# Patient Record
Sex: Female | Born: 1935 | Race: White | Hispanic: No | Smoking: Former smoker
Health system: Southern US, Community
[De-identification: ages and names within clinical notes are randomized; demographics above are authoritative.]

## PROBLEM LIST (undated history)

## (undated) DIAGNOSIS — Z0000 Encounter for general adult medical examination without abnormal findings: Secondary | ICD-10-CM

## (undated) DIAGNOSIS — I10 Essential (primary) hypertension: Secondary | ICD-10-CM

## (undated) DIAGNOSIS — R350 Frequency of micturition: Secondary | ICD-10-CM

## (undated) DIAGNOSIS — B354 Tinea corporis: Secondary | ICD-10-CM

## (undated) DIAGNOSIS — R413 Other amnesia: Secondary | ICD-10-CM

## (undated) DIAGNOSIS — S46911A Strain of unspecified muscle, fascia and tendon at shoulder and upper arm level, right arm, initial encounter: Secondary | ICD-10-CM

## (undated) DIAGNOSIS — R42 Dizziness and giddiness: Secondary | ICD-10-CM

## (undated) DIAGNOSIS — F32A Depression, unspecified: Secondary | ICD-10-CM

## (undated) DIAGNOSIS — J309 Allergic rhinitis, unspecified: Secondary | ICD-10-CM

## (undated) DIAGNOSIS — E785 Hyperlipidemia, unspecified: Secondary | ICD-10-CM

## (undated) DIAGNOSIS — Z124 Encounter for screening for malignant neoplasm of cervix: Secondary | ICD-10-CM

## (undated) DIAGNOSIS — F4321 Adjustment disorder with depressed mood: Secondary | ICD-10-CM

## (undated) DIAGNOSIS — K5790 Diverticulosis of intestine, part unspecified, without perforation or abscess without bleeding: Secondary | ICD-10-CM

## (undated) DIAGNOSIS — I701 Atherosclerosis of renal artery: Secondary | ICD-10-CM

## (undated) DIAGNOSIS — Z22322 Carrier or suspected carrier of Methicillin resistant Staphylococcus aureus: Secondary | ICD-10-CM

## (undated) DIAGNOSIS — G4700 Insomnia, unspecified: Secondary | ICD-10-CM

## (undated) DIAGNOSIS — F329 Major depressive disorder, single episode, unspecified: Secondary | ICD-10-CM

## (undated) DIAGNOSIS — R0789 Other chest pain: Secondary | ICD-10-CM

## (undated) DIAGNOSIS — H269 Unspecified cataract: Secondary | ICD-10-CM

## (undated) DIAGNOSIS — B372 Candidiasis of skin and nail: Secondary | ICD-10-CM

## (undated) DIAGNOSIS — B019 Varicella without complication: Secondary | ICD-10-CM

## (undated) DIAGNOSIS — H9113 Presbycusis, bilateral: Secondary | ICD-10-CM

## (undated) DIAGNOSIS — D126 Benign neoplasm of colon, unspecified: Secondary | ICD-10-CM

## (undated) HISTORY — DX: Strain of unspecified muscle, fascia and tendon at shoulder and upper arm level, right arm, initial encounter: S46.911A

## (undated) HISTORY — DX: Dizziness and giddiness: R42

## (undated) HISTORY — DX: Tinea corporis: B35.4

## (undated) HISTORY — DX: Presbycusis, bilateral: H91.13

## (undated) HISTORY — DX: Frequency of micturition: R35.0

## (undated) HISTORY — DX: Candidiasis of skin and nail: B37.2

## (undated) HISTORY — DX: Adjustment disorder with depressed mood: F43.21

## (undated) HISTORY — DX: Encounter for screening for malignant neoplasm of cervix: Z12.4

## (undated) HISTORY — DX: Major depressive disorder, single episode, unspecified: F32.9

## (undated) HISTORY — DX: Encounter for general adult medical examination without abnormal findings: Z00.00

## (undated) HISTORY — DX: Atherosclerosis of renal artery: I70.1

## (undated) HISTORY — DX: Hyperlipidemia, unspecified: E78.5

## (undated) HISTORY — DX: Unspecified cataract: H26.9

## (undated) HISTORY — PX: OTHER SURGICAL HISTORY: SHX169

## (undated) HISTORY — DX: Varicella without complication: B01.9

## (undated) HISTORY — DX: Diverticulosis of intestine, part unspecified, without perforation or abscess without bleeding: K57.90

## (undated) HISTORY — DX: Depression, unspecified: F32.A

## (undated) HISTORY — DX: Benign neoplasm of colon, unspecified: D12.6

## (undated) HISTORY — DX: Carrier or suspected carrier of methicillin resistant Staphylococcus aureus: Z22.322

## (undated) HISTORY — DX: Other amnesia: R41.3

## (undated) HISTORY — DX: Insomnia, unspecified: G47.00

## (undated) HISTORY — DX: Essential (primary) hypertension: I10

## (undated) HISTORY — DX: Allergic rhinitis, unspecified: J30.9

## (undated) HISTORY — DX: Other chest pain: R07.89

## (undated) HISTORY — PX: SKIN BIOPSY: SHX1

---

## 1990-03-04 HISTORY — PX: CHOLECYSTECTOMY: SHX55

## 2009-03-04 LAB — HM MAMMOGRAPHY: HM Mammogram: NORMAL

## 2009-04-13 ENCOUNTER — Emergency Department (HOSPITAL_BASED_OUTPATIENT_CLINIC_OR_DEPARTMENT_OTHER): Admission: EM | Admit: 2009-04-13 | Discharge: 2009-04-13 | Payer: Self-pay | Admitting: Emergency Medicine

## 2009-04-13 ENCOUNTER — Ambulatory Visit: Payer: Self-pay | Admitting: Diagnostic Radiology

## 2009-04-14 ENCOUNTER — Emergency Department (HOSPITAL_COMMUNITY): Admission: EM | Admit: 2009-04-14 | Discharge: 2009-04-14 | Payer: Self-pay | Admitting: Emergency Medicine

## 2009-05-02 HISTORY — PX: NM MYOCAR MULTIPLE W/SPECT: HXRAD626

## 2010-01-18 ENCOUNTER — Encounter: Payer: Self-pay | Admitting: Internal Medicine

## 2010-04-03 NOTE — Letter (Signed)
Summary: New Patient letter  [facility]  [address] [place], [place] [address]   Phone: [phone]  Fax: [phone]       01/18/2010 MRN: [id number]  Taylor Vaughn [address] [place], [place]  [address]  Dear Taylor Vaughn,  Welcome to the Gastroenterology Division at [facility].    You are scheduled to see Dr.  Leone Payor on 03-02-10 at 10am on the 3rd floor at [facility], [address].  We ask that you try to arrive at our office 15 minutes prior to your appointment time to allow for check-in.  We would like you to complete the enclosed self-administered evaluation form prior to your visit and bring it with you on the day of your appointment.  We will review it with you.  Also, please bring a complete list of all your medications or, if you prefer, bring the medication bottles and we will list them.  Please bring your insurance card so that we may make a copy of it.  If your insurance requires a referral to see a specialist, please bring your referral form from your primary care physician.  Co-payments are due at the time of your visit and may be paid by cash, check or credit card.     Your office visit will consist of a consult with your physician (includes a physical exam), any laboratory testing he/she may order, scheduling of any necessary diagnostic testing (e.g. x-ray, ultrasound, CT-scan), and scheduling of a procedure (e.g. Endoscopy, Colonoscopy) if required.  Please allow enough time on your schedule to allow for any/all of these possibilities.    If you cannot keep your appointment, please call [phone] to cancel or reschedule prior to your appointment date.  This allows [place] the opportunity to schedule an appointment for another patient in need of care.  If you do not cancel or reschedule by 5 p.m. the business day prior to your appointment date, you will be charged a $50.00 late cancellation/no-show fee.    Thank you for  choosing [facility] for your medical needs.  We appreciate the opportunity to care for you.  Please visit [place] at our website  to learn more about our practice.                     Sincerely,                                                             The Gastroenterology Division

## 2010-05-24 LAB — BASIC METABOLIC PANEL
Calcium: 10.2 mg/dL (ref 8.4–10.5)
Creatinine, Ser: 0.9 mg/dL (ref 0.4–1.2)
GFR calc non Af Amer: >60 mL/min (ref >60)
Glucose, Bld: 94 mg/dL (ref 70–99)

## 2010-05-24 LAB — POCT I-STAT, CHEM 8
BUN: 21 mg/dL (ref 6–23)
Chloride: 107 mEq/L (ref 96–112)
Creatinine, Ser: 0.9 mg/dL (ref 0.4–1.2)
Glucose, Bld: 99 mg/dL (ref 70–99)
HCT: 48 % — ABNORMAL HIGH (ref 36.0–46.0)
Sodium: 140 mEq/L (ref 135–145)

## 2010-05-24 LAB — POCT CARDIAC MARKERS: Troponin i, poc: <0.05 ng/mL (ref 0.00–0.09)

## 2011-02-02 LAB — HM COLONOSCOPY

## 2011-05-03 HISTORY — PX: OTHER SURGICAL HISTORY: SHX169

## 2011-07-22 ENCOUNTER — Encounter: Payer: Self-pay | Admitting: Family Medicine

## 2011-07-22 ENCOUNTER — Ambulatory Visit (INDEPENDENT_AMBULATORY_CARE_PROVIDER_SITE_OTHER): Payer: Medicare Other | Admitting: Family Medicine

## 2011-07-22 VITALS — BP 160/84 | HR 79 | Temp 98.3°F | Ht 63.25 in | Wt 168.0 lb

## 2011-07-22 DIAGNOSIS — R5381 Other malaise: Secondary | ICD-10-CM

## 2011-07-22 DIAGNOSIS — R0789 Other chest pain: Secondary | ICD-10-CM

## 2011-07-22 DIAGNOSIS — R319 Hematuria, unspecified: Secondary | ICD-10-CM

## 2011-07-22 DIAGNOSIS — R071 Chest pain on breathing: Secondary | ICD-10-CM

## 2011-07-22 DIAGNOSIS — Z0000 Encounter for general adult medical examination without abnormal findings: Secondary | ICD-10-CM

## 2011-07-22 DIAGNOSIS — R350 Frequency of micturition: Secondary | ICD-10-CM

## 2011-07-22 DIAGNOSIS — R52 Pain, unspecified: Secondary | ICD-10-CM

## 2011-07-22 DIAGNOSIS — I10 Essential (primary) hypertension: Secondary | ICD-10-CM

## 2011-07-22 DIAGNOSIS — K635 Polyp of colon: Secondary | ICD-10-CM | POA: Insufficient documentation

## 2011-07-22 DIAGNOSIS — G4700 Insomnia, unspecified: Secondary | ICD-10-CM

## 2011-07-22 DIAGNOSIS — R5383 Other fatigue: Secondary | ICD-10-CM

## 2011-07-22 DIAGNOSIS — E785 Hyperlipidemia, unspecified: Secondary | ICD-10-CM

## 2011-07-22 HISTORY — DX: Encounter for general adult medical examination without abnormal findings: Z00.00

## 2011-07-22 HISTORY — DX: Other chest pain: R07.89

## 2011-07-22 HISTORY — DX: Frequency of micturition: R35.0

## 2011-07-22 LAB — POCT URINALYSIS DIPSTICK
Ketones, UA: NEGATIVE
Protein, UA: NEGATIVE

## 2011-07-22 LAB — CBC: Hemoglobin: 14.4 g/dL (ref 12.0–15.0)

## 2011-07-22 MED ORDER — ALPRAZOLAM 0.25 MG PO TABS
0.2500 mg | ORAL_TABLET | Freq: Every evening | ORAL | Status: AC | PRN
Start: 2011-07-22 — End: 2011-08-21

## 2011-07-22 NOTE — Assessment & Plan Note (Signed)
Encouraged mgm and pap in the fall. She will be about 2 years from last mgm and 3 years from last pal

## 2011-07-22 NOTE — Assessment & Plan Note (Signed)
Had recent labs elsewhere. Mildly elevated. Avoid trans fats and start MegaRed caps daily

## 2011-07-22 NOTE — Assessment & Plan Note (Signed)
Patient reports her bp at home show 130to 140s over 60s to 48s, she is not willing to start a medicine at this time due to her intolerance of meds in past. Encouraged DASH diet and she should bring in her bp log at next visit.

## 2011-07-22 NOTE — Assessment & Plan Note (Signed)
Check a UA c and s today. Patient notes this is a long standing issue and has previously declined meds

## 2011-07-22 NOTE — Assessment & Plan Note (Signed)
Previously used Alprazolam intemittently with good results. Recently has been using AdvilPM. Is given a refill on Alprazolam 0.25 mg to use prn and can alternate with Advil PM

## 2011-07-22 NOTE — Progress Notes (Signed)
Patient ID: Taylor Vaughn, female   DOB: [birth year], 78 y.o.   MRN: [id number] Taylor Vaughn [id number] [birth year] 07/22/2011      Progress Note New Patient  Subjective  Chief Complaint  Chief Complaint  Patient presents with  . Establish Care    new patient    HPI  Patient is an 78 year old Caucasian female who is in today for new patient appointment. He is here today largely because roughly 2 days ago she noted some tenderness over her right medial clavicle. She denies trauma but says that it feels enlarged compared to the left side and she is concerned because her mother had breast cancer metastasized to her clavicle and ultimately died shortly thereafter. She's never had a history of cancer. She is generally in good health. Does note she has a 15 year history of insomnia is presently under great deal of stress caring for her husband who is essentially bedridden and on hospice in the home. Alprazolam 0.25 mg has worked well for her. At present she is using Advil PM intermittently with decent results. She's complaining also of urinary frequency going to use the restroom roughly every 2 hours during the day every hour at night. Also has some suprapubic pressure. Denies hematuria or dysuria. No fevers chills. Finally she complains of weakness and feeling lightheaded or dizzy. Sometimes this occurs upon arising but not always. She does know she only eats roughly 2 meals a day does not Mr. Larita Fife note a pattern near or away from meals either. No falls or syncope no headaches or other neurologic complaints. No chest pain, palpitations, shortness of breath otherwise noted.  Past Medical History  Diagnosis Date  . Chicken pox as a child  . Measles 17  . Mumps as a child  . Whooping cough 7 yrs old  . Hypertension   . Hyperlipidemia   . Insomnia   . Colon polyps     adenomatous, recurrrent, due  again 12/13  . Urinary frequency 07/22/2011  . Acute chest wall pain 07/22/2011  . Preventative  health care 07/22/2011    Past Surgical History  Procedure Date  . Cholecystectomy 1992  . Skin biopsy     multiple, all benign    Family History  Problem Relation Age of Onset  . Cancer Mother 96    breast- remission, late 8's lung cancer  . Hypertension Mother     ?  Marland Kitchen Angina Father   . Diabetes Father     type 2  . Heart disease Father   . Obesity Sister   . Diabetes Sister     type 2  . Hyperlipidemia Sister   . Heart attack Sister   . Spina bifida Brother   . Constipation Daughter   . Hypertension Daughter   . Hyperlipidemia Daughter   . Obesity Son   . Hypertension Son   . Obesity Maternal Grandmother   . Heart attack Maternal Grandmother   . Stroke Paternal Grandfather     History   Social History  . Marital Status: Married    Spouse Name: N/A    Number of Children: N/A  . Years of Education: N/A   Occupational History  . Not on file.   Social History Main Topics  . Smoking status: Former Smoker    Types: Cigarettes  . Smokeless tobacco: Never Used   Comment: smoked for 3 years in 30's  . Alcohol Use: No  . Drug Use: No  . Sexually Active: No  Other Topics Concern  . Not on file   Social History Narrative  . No narrative on file    No current outpatient prescriptions on file prior to visit.    Allergies  Allergen Reactions  . Lisinopril Cough  . Statins     Muscle cramps, rashes, loss of hair  . Keflex (Cephalexin) Rash  . Losartan     phlegm    Review of Systems  Review of Systems  Constitutional: Negative for fever, chills and malaise/fatigue.  HENT: Negative for hearing loss, nosebleeds and congestion.   Eyes: Negative for discharge.  Respiratory: Negative for cough, sputum production, shortness of breath and wheezing.   Cardiovascular: Positive for chest pain. Negative for palpitations and leg swelling.  Gastrointestinal: Positive for abdominal pain. Negative for heartburn, nausea, vomiting, diarrhea, constipation and  blood in stool.       Notes intermittent suprapubic pressure  Genitourinary: Positive for urgency and frequency. Negative for dysuria and hematuria.  Musculoskeletal: Negative for myalgias, back pain and falls.  Skin: Negative for rash.  Neurological: Positive for dizziness. Negative for tremors, sensory change, focal weakness, loss of consciousness, weakness and headaches.       Occasional episodes of feeling woozey or light headed.  Endo/Heme/Allergies: Negative for polydipsia. Does not bruise/bleed easily.  Psychiatric/Behavioral: Negative for depression and suicidal ideas. The patient is not nervous/anxious and does not have insomnia.     Objective  BP 160/84  Pulse 79  Temp(Src) 98.3 F (36.8 C) (Temporal)  Ht 5' 3.25" (1.607 m)  Wt 168 lb (76.204 kg)  BMI 29.52 kg/m2  SpO2 99%  Physical Exam  Physical Exam  Constitutional: She is oriented to person, place, and time and well-developed, well-nourished, and in no distress. No distress.  HENT:  Head: Normocephalic and atraumatic.  Right Ear: External ear normal.  Left Ear: External ear normal.  Nose: Nose normal.  Mouth/Throat: Oropharynx is clear and moist. No oropharyngeal exudate.  Eyes: Conjunctivae are normal. Pupils are equal, round, and reactive to light. Right eye exhibits no discharge. Left eye exhibits no discharge. No scleral icterus.  Neck: Normal range of motion. Neck supple. No thyromegaly present.  Cardiovascular: Normal rate, regular rhythm, normal heart sounds and intact distal pulses.   No murmur heard. Pulmonary/Chest: Effort normal and breath sounds normal. No respiratory distress. She has no wheezes. She has no rales.  Abdominal: Soft. Bowel sounds are normal. She exhibits no distension and no mass. There is no tenderness.  Musculoskeletal: Normal range of motion. She exhibits no edema and no tenderness.  Lymphadenopathy:    She has no cervical adenopathy.  Neurological: She is alert and oriented to  person, place, and time. She has normal reflexes. No cranial nerve deficit. Coordination normal.  Skin: Skin is warm and dry. No rash noted. She is not diaphoretic.  Psychiatric: Mood, memory and affect normal.       Assessment & Plan  Insomnia Previously used Alprazolam intemittently with good results. Recently has been using AdvilPM. Is given a refill on Alprazolam 0.25 mg to use prn and can alternate with Advil PM  Hypertension Patient reports her bp at home show 130to 140s over 60s to 70s, she is not willing to start a medicine at this time due to her intolerance of meds in past. Encouraged DASH diet and she should bring in her bp log at next visit.  Hyperlipidemia Had recent labs elsewhere. Mildly elevated. Avoid trans fats and start MegaRed caps daily  Urinary frequency Check  a UA c and s today. Patient notes this is a long standing issue and has previously declined meds  Acute chest wall pain Notes 2 day h/o noting that her medial right clavicle is tender with palpation and larger than her left one feels. Will proceed with xray to evaluate. Encouraged ice and Aspercreme   Preventative health care Encouraged mgm and pap in the fall. She will be about 2 years from last mgm and 3 years from last pal

## 2011-07-22 NOTE — Assessment & Plan Note (Signed)
Notes 2 day h/o noting that her medial right clavicle is tender with palpation and larger than her left one feels. Will proceed with xray to evaluate. Encouraged ice and Aspercreme

## 2011-07-22 NOTE — Patient Instructions (Signed)
Preventive Care for Adults, Female A healthy lifestyle and preventive care can promote health and wellness. Preventive health guidelines for women include the following key practices.  A routine yearly physical is a good way to check with your caregiver about your health and preventive screening. It is a chance to share any concerns and updates on your health, and to receive a thorough exam.   Visit your dentist for a routine exam and preventive care every 6 months. Brush your teeth twice a day and floss once a day. Good oral hygiene prevents tooth decay and gum disease.   The frequency of eye exams is based on your age, health, family medical history, use of contact lenses, and other factors. Follow your caregiver's recommendations for frequency of eye exams.   Eat a healthy diet. Foods like vegetables, fruits, whole grains, low-fat dairy products, and lean protein foods contain the nutrients you need without too many calories. Decrease your intake of foods high in solid fats, added sugars, and salt. Eat the right amount of calories for you.Get information about a proper diet from your caregiver, if necessary.   Regular physical exercise is one of the most important things you can do for your health. Most adults should get at least 150 minutes of moderate-intensity exercise (any activity that increases your heart rate and causes you to sweat) each week. In addition, most adults need muscle-strengthening exercises on 2 or more days a week.   Maintain a healthy weight. The body mass index (BMI) is a screening tool to identify possible weight problems. It provides an estimate of body fat based on height and weight. Your caregiver can help determine your BMI, and can help you achieve or maintain a healthy weight.For adults 20 years and older:   A BMI below 18.5 is considered underweight.   A BMI of 18.5 to 24.9 is normal.   A BMI of 25 to 29.9 is considered overweight.   A BMI of 30 and above is  considered obese.   Maintain normal blood lipids and cholesterol levels by exercising and minimizing your intake of saturated fat. Eat a balanced diet with plenty of fruit and vegetables. Blood tests for lipids and cholesterol should begin at age 20 and be repeated every 5 years. If your lipid or cholesterol levels are high, you are over 50, or you are at high risk for heart disease, you may need your cholesterol levels checked more frequently.Ongoing high lipid and cholesterol levels should be treated with medicines if diet and exercise are not effective.   If you smoke, find out from your caregiver how to quit. If you do not use tobacco, do not start.   If you are pregnant, do not drink alcohol. If you are breastfeeding, be very cautious about drinking alcohol. If you are not pregnant and choose to drink alcohol, do not exceed 1 drink per day. One drink is considered to be 12 ounces (355 mL) of beer, 5 ounces (148 mL) of wine, or 1.5 ounces (44 mL) of liquor.   Avoid use of street drugs. Do not share needles with anyone. Ask for help if you need support or instructions about stopping the use of drugs.   High blood pressure causes heart disease and increases the risk of stroke. Your blood pressure should be checked at least every 1 to 2 years. Ongoing high blood pressure should be treated with medicines if weight loss and exercise are not effective.   If you are 55 to 76   years old, ask your caregiver if you should take aspirin to prevent strokes.   Diabetes screening involves taking a blood sample to check your fasting blood sugar level. This should be done once every 3 years, after age 45, if you are within normal weight and without risk factors for diabetes. Testing should be considered at a younger age or be carried out more frequently if you are overweight and have at least 1 risk factor for diabetes.   Breast cancer screening is essential preventive care for women. You should practice "breast  self-awareness." This means understanding the normal appearance and feel of your breasts and may include breast self-examination. Any changes detected, no matter how small, should be reported to a caregiver. Women in their 20s and 30s should have a clinical breast exam (CBE) by a caregiver as part of a regular health exam every 1 to 3 years. After age 40, women should have a CBE every year. Starting at age 40, women should consider having a mammography (breast X-ray test) every year. Women who have a family history of breast cancer should talk to their caregiver about genetic screening. Women at a high risk of breast cancer should talk to their caregivers about having magnetic resonance imaging (MRI) and a mammography every year.   The Pap test is a screening test for cervical cancer. A Pap test can show cell changes on the cervix that might become cervical cancer if left untreated. A Pap test is a procedure in which cells are obtained and examined from the lower end of the uterus (cervix).   Women should have a Pap test starting at age 21.   Between ages 21 and 29, Pap tests should be repeated every 2 years.   Beginning at age 30, you should have a Pap test every 3 years as long as the past 3 Pap tests have been normal.   Some women have medical problems that increase the chance of getting cervical cancer. Talk to your caregiver about these problems. It is especially important to talk to your caregiver if a new problem develops soon after your last Pap test. In these cases, your caregiver may recommend more frequent screening and Pap tests.   The above recommendations are the same for women who have or have not gotten the vaccine for human papillomavirus (HPV).   If you had a hysterectomy for a problem that was not cancer or a condition that could lead to cancer, then you no longer need Pap tests. Even if you no longer need a Pap test, a regular exam is a good idea to make sure no other problems are  starting.   If you are between ages 65 and 70, and you have had normal Pap tests going back 10 years, you no longer need Pap tests. Even if you no longer need a Pap test, a regular exam is a good idea to make sure no other problems are starting.   If you have had past treatment for cervical cancer or a condition that could lead to cancer, you need Pap tests and screening for cancer for at least 20 years after your treatment.   If Pap tests have been discontinued, risk factors (such as a new sexual partner) need to be reassessed to determine if screening should be resumed.   The HPV test is an additional test that may be used for cervical cancer screening. The HPV test looks for the virus that can cause the cell changes on the cervix.   The cells collected during the Pap test can be tested for HPV. The HPV test could be used to screen women aged 30 years and older, and should be used in women of any age who have unclear Pap test results. After the age of 30, women should have HPV testing at the same frequency as a Pap test.   Colorectal cancer can be detected and often prevented. Most routine colorectal cancer screening begins at the age of 50 and continues through age 75. However, your caregiver may recommend screening at an earlier age if you have risk factors for colon cancer. On a yearly basis, your caregiver may provide home test kits to check for hidden blood in the stool. Use of a small camera at the end of a tube, to directly examine the colon (sigmoidoscopy or colonoscopy), can detect the earliest forms of colorectal cancer. Talk to your caregiver about this at age 50, when routine screening begins. Direct examination of the colon should be repeated every 5 to 10 years through age 75, unless early forms of pre-cancerous polyps or small growths are found.   Hepatitis C blood testing is recommended for all people born from 1945 through 1965 and any individual with known risks for hepatitis C.    Practice safe sex. Use condoms and avoid high-risk sexual practices to reduce the spread of sexually transmitted infections (STIs). STIs include gonorrhea, chlamydia, syphilis, trichomonas, herpes, HPV, and human immunodeficiency virus (HIV). Herpes, HIV, and HPV are viral illnesses that have no cure. They can result in disability, cancer, and death. Sexually active women aged 25 and younger should be checked for chlamydia. Older women with new or multiple partners should also be tested for chlamydia. Testing for other STIs is recommended if you are sexually active and at increased risk.   Osteoporosis is a disease in which the bones lose minerals and strength with aging. This can result in serious bone fractures. The risk of osteoporosis can be identified using a bone density scan. Women ages 65 and over and women at risk for fractures or osteoporosis should discuss screening with their caregivers. Ask your caregiver whether you should take a calcium supplement or vitamin D to reduce the rate of osteoporosis.   Menopause can be associated with physical symptoms and risks. Hormone replacement therapy is available to decrease symptoms and risks. You should talk to your caregiver about whether hormone replacement therapy is right for you.   Use sunscreen with sun protection factor (SPF) of 30 or more. Apply sunscreen liberally and repeatedly throughout the day. You should seek shade when your shadow is shorter than you. Protect yourself by wearing long sleeves, pants, a wide-brimmed hat, and sunglasses year round, whenever you are outdoors.   Once a month, do a whole body skin exam, using a mirror to look at the skin on your back. Notify your caregiver of new moles, moles that have irregular borders, moles that are larger than a pencil eraser, or moles that have changed in shape or color.   Stay current with required immunizations.   Influenza. You need a dose every fall (or winter). The composition of  the flu vaccine changes each year, so being vaccinated once is not enough.   Pneumococcal polysaccharide. You need 1 to 2 doses if you smoke cigarettes or if you have certain chronic medical conditions. You need 1 dose at age 65 (or older) if you have never been vaccinated.   Tetanus, diphtheria, pertussis (Tdap, Td). Get 1 dose of   Tdap vaccine if you are younger than age 65, are over 65 and have contact with an infant, are a healthcare worker, are pregnant, or simply want to be protected from whooping cough. After that, you need a Td booster dose every 10 years. Consult your caregiver if you have not had at least 3 tetanus and diphtheria-containing shots sometime in your life or have a deep or dirty wound.   HPV. You need this vaccine if you are a woman age 26 or younger. The vaccine is given in 3 doses over 6 months.   Measles, mumps, rubella (MMR). You need at least 1 dose of MMR if you were born in 1957 or later. You may also need a second dose.   Meningococcal. If you are age 19 to 21 and a first-year college student living in a residence hall, or have one of several medical conditions, you need to get vaccinated against meningococcal disease. You may also need additional booster doses.   Zoster (shingles). If you are age 60 or older, you should get this vaccine.   Varicella (chickenpox). If you have never had chickenpox or you were vaccinated but received only 1 dose, talk to your caregiver to find out if you need this vaccine.   Hepatitis A. You need this vaccine if you have a specific risk factor for hepatitis A virus infection or you simply wish to be protected from this disease. The vaccine is usually given as 2 doses, 6 to 18 months apart.   Hepatitis B. You need this vaccine if you have a specific risk factor for hepatitis B virus infection or you simply wish to be protected from this disease. The vaccine is given in 3 doses, usually over 6 months.  Preventive Services /  Frequency Ages 19 to 39  Blood pressure check.** / Every 1 to 2 years.   Lipid and cholesterol check.** / Every 5 years beginning at age 20.   Clinical breast exam.** / Every 3 years for women in their 20s and 30s.   Pap test.** / Every 2 years from ages 21 through 29. Every 3 years starting at age 30 through age 65 or 70 with a history of 3 consecutive normal Pap tests.   HPV screening.** / Every 3 years from ages 30 through ages 65 to 70 with a history of 3 consecutive normal Pap tests.   Hepatitis C blood test.** / For any individual with known risks for hepatitis C.   Skin self-exam. / Monthly.   Influenza immunization.** / Every year.   Pneumococcal polysaccharide immunization.** / 1 to 2 doses if you smoke cigarettes or if you have certain chronic medical conditions.   Tetanus, diphtheria, pertussis (Tdap, Td) immunization. / A one-time dose of Tdap vaccine. After that, you need a Td booster dose every 10 years.   HPV immunization. / 3 doses over 6 months, if you are 26 and younger.   Measles, mumps, rubella (MMR) immunization. / You need at least 1 dose of MMR if you were born in 1957 or later. You may also need a second dose.   Meningococcal immunization. / 1 dose if you are age 19 to 21 and a first-year college student living in a residence hall, or have one of several medical conditions, you need to get vaccinated against meningococcal disease. You may also need additional booster doses.   Varicella immunization.** / Consult your caregiver.   Hepatitis A immunization.** / Consult your caregiver. 2 doses, 6 to 18 months   apart.   Hepatitis B immunization.** / Consult your caregiver. 3 doses usually over 6 months.  Ages 40 to 64  Blood pressure check.** / Every 1 to 2 years.   Lipid and cholesterol check.** / Every 5 years beginning at age 20.   Clinical breast exam.** / Every year after age 40.   Mammogram.** / Every year beginning at age 40 and continuing for as  long as you are in good health. Consult with your caregiver.   Pap test.** / Every 3 years starting at age 30 through age 65 or 70 with a history of 3 consecutive normal Pap tests.   HPV screening.** / Every 3 years from ages 30 through ages 65 to 70 with a history of 3 consecutive normal Pap tests.   Fecal occult blood test (FOBT) of stool. / Every year beginning at age 50 and continuing until age 75. You may not need to do this test if you get a colonoscopy every 10 years.   Flexible sigmoidoscopy or colonoscopy.** / Every 5 years for a flexible sigmoidoscopy or every 10 years for a colonoscopy beginning at age 50 and continuing until age 75.   Hepatitis C blood test.** / For all people born from 1945 through 1965 and any individual with known risks for hepatitis C.   Skin self-exam. / Monthly.   Influenza immunization.** / Every year.   Pneumococcal polysaccharide immunization.** / 1 to 2 doses if you smoke cigarettes or if you have certain chronic medical conditions.   Tetanus, diphtheria, pertussis (Tdap, Td) immunization.** / A one-time dose of Tdap vaccine. After that, you need a Td booster dose every 10 years.   Measles, mumps, rubella (MMR) immunization. / You need at least 1 dose of MMR if you were born in 1957 or later. You may also need a second dose.   Varicella immunization.** / Consult your caregiver.   Meningococcal immunization.** / Consult your caregiver.   Hepatitis A immunization.** / Consult your caregiver. 2 doses, 6 to 18 months apart.   Hepatitis B immunization.** / Consult your caregiver. 3 doses, usually over 6 months.  Ages 65 and over  Blood pressure check.** / Every 1 to 2 years.   Lipid and cholesterol check.** / Every 5 years beginning at age 20.   Clinical breast exam.** / Every year after age 40.   Mammogram.** / Every year beginning at age 40 and continuing for as long as you are in good health. Consult with your caregiver.   Pap test.** /  Every 3 years starting at age 30 through age 65 or 70 with a 3 consecutive normal Pap tests. Testing can be stopped between 65 and 70 with 3 consecutive normal Pap tests and no abnormal Pap or HPV tests in the past 10 years.   HPV screening.** / Every 3 years from ages 30 through ages 65 or 70 with a history of 3 consecutive normal Pap tests. Testing can be stopped between 65 and 70 with 3 consecutive normal Pap tests and no abnormal Pap or HPV tests in the past 10 years.   Fecal occult blood test (FOBT) of stool. / Every year beginning at age 50 and continuing until age 75. You may not need to do this test if you get a colonoscopy every 10 years.   Flexible sigmoidoscopy or colonoscopy.** / Every 5 years for a flexible sigmoidoscopy or every 10 years for a colonoscopy beginning at age 50 and continuing until age 75.   Hepatitis   C blood test.** / For all people born from [birth year] through 1965 and any individual with known risks for hepatitis C.   Osteoporosis screening.** / A one-time screening for women ages 41 and over and women at risk for fractures or osteoporosis.   Skin self-exam. / Monthly.   Influenza immunization.** / Every year.   Pneumococcal polysaccharide immunization.** / 1 dose at age 60 (or older) if you have never been vaccinated.   Tetanus, diphtheria, pertussis (Tdap, Td) immunization. / A one-time dose of Tdap vaccine if you are over 65 and have contact with an infant, are a Research scientist (physical sciences), or simply want to be protected from whooping cough. After that, you need a Td booster dose every 10 years.   Varicella immunization.** / Consult your caregiver.   Meningococcal immunization.** / Consult your caregiver.   Hepatitis A immunization.** / Consult your caregiver. 2 doses, 6 to 18 months apart.   Hepatitis B immunization.** / Check with your caregiver. 3 doses, usually over 6 months.  ** Family history and personal history of risk and conditions may change your caregiver's  recommendations. Document Released: 04/16/2001 Document Revised: 02/07/2011 Document Reviewed: 07/16/2010 [facility] Patient Information 2012 [place], [place].  MegaRed caps daily, Schiff  64 oz of clear fluids daily/moist tongue

## 2011-07-23 ENCOUNTER — Telehealth: Payer: Self-pay | Admitting: Family Medicine

## 2011-07-23 LAB — TSH: TSH: 2.47 u[IU]/mL (ref 0.35–5.50)

## 2011-07-23 NOTE — Telephone Encounter (Signed)
Message left for pt that [facility] has faxed RX twice and sometimes it takes 24-48 hours for mail order pharmacies to get prescriptions entered into the system to begin processing.  Pt advised to call if she has any other concerns.

## 2011-07-23 NOTE — Telephone Encounter (Signed)
Patient hasn't heard from the mail order pharmacy, they usually call her when they receive an Rx, can you call to make sure they received her xanax Rx [phone]?

## 2011-07-24 LAB — URINE CULTURE

## 2011-07-25 NOTE — Telephone Encounter (Signed)
Please call patient when pharmacy has been contacted

## 2011-07-25 NOTE — Telephone Encounter (Signed)
Already done

## 2011-07-26 ENCOUNTER — Ambulatory Visit (HOSPITAL_BASED_OUTPATIENT_CLINIC_OR_DEPARTMENT_OTHER)
Admission: RE | Admit: 2011-07-26 | Discharge: 2011-07-26 | Disposition: A | Discharge status: Home / Self Care | Payer: Medicare Other | Source: Ambulatory Visit | Attending: Family Medicine | Admitting: Family Medicine

## 2011-07-26 DIAGNOSIS — Z85830 Personal history of malignant neoplasm of bone: Secondary | ICD-10-CM | POA: Insufficient documentation

## 2011-07-26 DIAGNOSIS — R52 Pain, unspecified: Secondary | ICD-10-CM

## 2011-07-26 DIAGNOSIS — R079 Chest pain, unspecified: Secondary | ICD-10-CM | POA: Insufficient documentation

## 2011-10-03 ENCOUNTER — Ambulatory Visit (INDEPENDENT_AMBULATORY_CARE_PROVIDER_SITE_OTHER): Payer: Medicare Other | Admitting: Family Medicine

## 2011-10-03 ENCOUNTER — Encounter: Payer: Self-pay | Admitting: Family Medicine

## 2011-10-03 VITALS — BP 147/84 | HR 80 | Temp 98.3°F | Ht 63.25 in | Wt 173.8 lb

## 2011-10-03 DIAGNOSIS — F4320 Adjustment disorder, unspecified: Secondary | ICD-10-CM

## 2011-10-03 DIAGNOSIS — S46911A Strain of unspecified muscle, fascia and tendon at shoulder and upper arm level, right arm, initial encounter: Secondary | ICD-10-CM

## 2011-10-03 DIAGNOSIS — I10 Essential (primary) hypertension: Secondary | ICD-10-CM

## 2011-10-03 DIAGNOSIS — B379 Candidiasis, unspecified: Secondary | ICD-10-CM

## 2011-10-03 DIAGNOSIS — B372 Candidiasis of skin and nail: Secondary | ICD-10-CM | POA: Insufficient documentation

## 2011-10-03 DIAGNOSIS — F4321 Adjustment disorder with depressed mood: Secondary | ICD-10-CM

## 2011-10-03 DIAGNOSIS — Z22322 Carrier or suspected carrier of Methicillin resistant Staphylococcus aureus: Secondary | ICD-10-CM

## 2011-10-03 DIAGNOSIS — IMO0002 Reserved for concepts with insufficient information to code with codable children: Secondary | ICD-10-CM

## 2011-10-03 HISTORY — DX: Candidiasis of skin and nail: B37.2

## 2011-10-03 HISTORY — DX: Adjustment disorder with depressed mood: F43.21

## 2011-10-03 HISTORY — DX: Strain of unspecified muscle, fascia and tendon at shoulder and upper arm level, right arm, initial encounter: S46.911A

## 2011-10-03 HISTORY — DX: Carrier or suspected carrier of methicillin resistant Staphylococcus aureus: Z22.322

## 2011-10-03 HISTORY — DX: Adjustment disorder, unspecified: F43.20

## 2011-10-03 MED ORDER — KETOCONAZOLE 2 % EX CREA: TOPICAL_CREAM | Freq: Two times a day (BID) | CUTANEOUS | Status: DC

## 2011-10-03 MED ORDER — MUPIROCIN 2 % EX OINT: TOPICAL_OINTMENT | Freq: Two times a day (BID) | CUTANEOUS | Status: AC

## 2011-10-03 NOTE — Assessment & Plan Note (Signed)
Patient just widowed on June 25, she is struggling but has plans to go visit her children soon. She has contacted Hospice and is goind to start grief counseling

## 2011-10-03 NOTE — Patient Instructions (Addendum)
MRSA Overview MRSA stands for methicillin-resistant Staphylococcus aureus. It is a type of bacteria that is resistant to some common antibiotics. It can cause infections in the skin and many other places in the body. Staphylococcus aureus, often called "staph," is a bacteria that normally lives on the skin or in the nose. Staph on the surface of the skin or in the nose does not cause problems. However, if the staph enters the body through a cut, wound, or break in the skin, an infection can happen. Up until recently, infections with the MRSA type of staph mainly occurred in hospitals and other healthcare settings. There are now increasing problems with MRSA infections in the community as well. Infections with MRSA may be very serious or even life-threatening. Most MRSA infections are acquired in one of two ways:  Healthcare-associated MRSA (HA-MRSA)   This can be acquired by people in any healthcare setting. MRSA can be a big problem for hospitalized people, people in nursing homes, people in rehabilitation facilities, people with weakened immune systems, dialysis patients, and those who have had surgery.   Community-associated MRSA (CA-MRSA)   Community spread of MRSA is becoming more common. It is known to spread in crowded settings, in jails and prisons, and in situations where there is close skin-to-skin contact, such as during sporting events or in locker rooms. MRSA can be spread through shared items, such as children's toys, razors, towels, or sports equipment.  CAUSES  All staph, including MRSA, are normally harmless unless they enter the body through a scratch, cut, or wound, such as with surgery. All staph, including MRSA, can be spread from person-to-person by touching contaminated objects or through direct contact. SPECIAL GROUPS MRSA can present problems for special groups of people. Some of these groups include:  Breastfeeding women.   The most common problem is MRSA infection of the  breast (mastitis). There is evidence that MRSA can be passed to an infant from infected breast milk. Your caregiver may recommend that you stop breastfeeding until the mastitis is under control.   If you are breastfeeding and have a MRSA infection in a place other than the breast, you may usually continue breastfeeding while under treatment. If taking antibiotics, ask your caregiver if it is safe to continue breastfeeding while taking your prescribed medicines.   Neonates (babies from birth to 1 month old) and infants (babies from 1 month to 1 year old).   There is evidence that MRSA can be passed to a newborn at birth if the mother has MRSA on the skin, in or around the birth canal, or an infection in the uterus, cervix, or vagina. MRSA infection can have the same appearance as a normal newborn or infant rash or several other skin infections. This can make it hard to diagnose MRSA.   Immune compromised people.   If you have an immune system problem, you may have a higher chance of developing a MRSA infection.   People after any type of surgery.   Staph in general, including MRSA, is the most common cause of infections occurring at the site of recent surgery.   People on long-term steroid medicines.   These kinds of medicines can lower your resistance to infection. This can increase your chance of getting MRSA.   People who have had frequent hospitalizations, live in nursing homes or other residential care facilities, have venous or urinary catheters, or have taken multiple courses of antibiotic therapy for any reason.  DIAGNOSIS  Diagnosis of MRSA is   done by cultures of fluid samples that may come from:  Swabs taken from cuts or wounds in infected areas.   Nasal swabs.   Saliva or deep cough specimens from the lungs (sputum).   Urine.   Blood.  Many people are "colonized" with MRSA but have no signs of infection. This means that people carry the MRSA germ on their skin or in their  nose and may never develop MRSA infection.  TREATMENT  Treatment varies and is based on how serious, how deep, or how extensive the infection is. For example:  Some skin infections, such as a small boil or abscess, may be treated by draining yellowish-white fluid (pus) from the site of the infection.   Deeper or more widespread soft tissue infections are usually treated with surgery to drain pus and with antibiotic medicine given by vein or by mouth. This may be recommended even if you are pregnant.   Serious infections may require a hospital stay.  If antibiotics are given, they may be needed for several weeks. PREVENTION  Because many people are colonized with staph, including MRSA, preventing the spread of the bacteria from person-to-person is most important. The best way to prevent the spread of bacteria and other germs is through proper hand washing or by using alcohol-based hand disinfectants. The following are other ways to help prevent MRSA infection within the hospital and community settings.   Healthcare settings:   Strict hand washing or hand disinfection procedures need to be followed before and after touching every patient.   Patients infected with MRSA are placed in isolation to prevent the spread of the bacteria.   Healthcare workers need to wear disposable gowns and gloves when touching or caring for patients infected with MRSA. Visitors may also be asked to wear a gown and gloves.   Hospital surfaces need to be disinfected frequently.   Community settings:   Wash your hands frequently with soap and water for at least 15 seconds. Otherwise, use alcohol-based hand disinfectants when soap and water is not available.   Make sure people who live with you wash their hands often, too.   Do not share personal items. For example, avoid sharing razors and other personal hygiene items, towels, clothing, and athletic equipment.   Wash and dry your clothes and bedding at the  warmest temperatures recommended on the labels.   Keep wounds covered. Pus from infected sores may contain MRSA and other bacteria. Keep cuts and abrasions clean and covered with germ-free (sterile), dry bandages until they are healed.   If you have a wound that appears infected, ask your caregiver if a culture for MRSA and other bacteria should be done.   If you are breastfeeding, talk to your caregiver about MRSA. You may be asked to temporarily stop breastfeeding.  HOME CARE INSTRUCTIONS   Take your antibiotics as directed. Finish them even if you start to feel better.   Avoid close contact with those around you as much as possible. Do not use towels, razors, toothbrushes, bedding, or other items that will be used by others.   To fight the infection, follow your caregiver's instructions for wound care. Wash your hands before and after changing your bandages.   If you have an intravascular device, such as a catheter, make sure you know how to care for it.   Be sure to tell any healthcare providers that you have MRSA so they are aware of your infection.  SEEK IMMEDIATE MEDICAL CARE IF:     The infection appears to be getting worse. Signs include:   Increased warmth, redness, or tenderness around the wound site.   A red line that extends from the infection site.   A dark color in the area around the infection.   Wound drainage that is tan, yellow, or green.   A bad smell coming from the wound.   You feel sick to your stomach (nauseous) and throw up (vomit) or cannot keep medicine down.   You have a fever.   Your baby is older than 3 months with a rectal temperature of 102 F (38.9 C) or higher.   Your baby is 57 months old or younger with a rectal temperature of 100.4 F (38 C) or higher.   You have difficulty breathing.  MAKE SURE YOU:   Understand these instructions.   Will watch your condition.   Will get help right away if you are not doing well or get worse.    Document Released: 02/18/2005 Document Revised: 02/07/2011 Document Reviewed: 05/23/2010 [facility] Patient Information 2012 [place], [place].  Start a probiotic such as Librarian, academic,  Culturelle, Vear Clock' colon health  Add Mupirocin to the nose twice a day  Apply Ketoconazole to skin twice a day  Witch Hazel Astringent to skin with any lesion or itching   For right shoulder pain try moist heat and stretching as directed then apply Aspercreme

## 2011-10-03 NOTE — Assessment & Plan Note (Signed)
Recently done at dermatology. No concerning lesions today, Bactroban to nares bid, 1 tbls bleach in bath weekly.

## 2011-10-03 NOTE — Progress Notes (Signed)
Patient ID: Taylor Vaughn, female   DOB: 1929-06-30, 78 y.o.   MRN: [id number] Drianna Chandran [id number] [birth year] 10/03/2011      Progress Note-Follow Up  Subjective  Chief Complaint  Chief Complaint  Patient presents with  . Follow-up    MRSA- pt states it looks better but still a little spot there. Pt states she has an itchy spot on her back but her front never itched her.    HPI  This 78 year old Caucasian female who is in today for a couple concerns. She was widowed after over 30 years of marriage on 08/27/2011. She is tearful and clearly still grieving but reports she is handling it okay. She has good family support and is leading to visit her children soon is starting grief counseling hospice soon. During her husbands declining he did develop MRSA and then unfortunately some of the patient. She's been seeing dermatology and have this confirmed. She did respond to a course of antibiotics but is now concerned that it may be recurrent. She took doxycycline mupirocin. She denies any fever sores in her nose. Does have persistent rash under her breasts but says it is better. Has been treated with ketoconazole as well these lesions. She reports overall physically she is doing all right but she does note upon arising quickly she feels somewhat lightheaded. No headaches or other neurologic complaints. No chest pain, palpitations, shortness of breath, GI or GU complaints otherwise noted  Past Medical History  Diagnosis Date  . Chicken pox as a child  . Measles 17  . Mumps as a child  . Whooping cough 7 yrs old  . Hypertension   . Hyperlipidemia   . Insomnia   . Colon polyps     adenomatous, recurrrent, due  again 12/13  . Urinary frequency 07/22/2011  . Acute chest wall pain 07/22/2011  . Preventative health care 07/22/2011  . Right shoulder strain 10/03/2011  . MRSA (methicillin resistant staph aureus) culture positive 10/03/2011  . Grief reaction 10/03/2011  . Candidal skin infection  10/03/2011    Past Surgical History  Procedure Date  . Cholecystectomy 1992  . Skin biopsy     multiple, all benign    Family History  Problem Relation Age of Onset  . Cancer Mother 47    breast- remission, late 31's lung cancer  . Hypertension Mother     ?  Marland Kitchen Angina Father   . Diabetes Father     type 2  . Heart disease Father   . Obesity Sister   . Diabetes Sister     type 2  . Hyperlipidemia Sister   . Heart attack Sister   . Spina bifida Brother   . Constipation Daughter   . Hypertension Daughter   . Hyperlipidemia Daughter   . Obesity Son   . Hypertension Son   . Obesity Maternal Grandmother   . Heart attack Maternal Grandmother   . Stroke Paternal Grandfather     History   Social History  . Marital Status: Widowed    Spouse Name: N/A    Number of Children: N/A  . Years of Education: N/A   Occupational History  . Not on file.   Social History Main Topics  . Smoking status: Former Smoker    Types: Cigarettes  . Smokeless tobacco: Never Used   Comment: smoked for 3 years in 30's  . Alcohol Use: No  . Drug Use: No  . Sexually Active: No   Other Topics Concern  .  Not on file   Social History Narrative  . No narrative on file    Current Outpatient Prescriptions on File Prior to Visit  Medication Sig Dispense Refill  . Calcium Carbonate-Vit D-Min (CALCIUM 1200 PO) Take 1 tablet by mouth 2 (two) times daily.        Allergies  Allergen Reactions  . Lisinopril Cough  . Statins     Muscle cramps, rashes, loss of hair  . Keflex (Cephalexin) Rash  . Losartan     phlegm    Review of Systems  Review of Systems  Constitutional: Negative for fever and malaise/fatigue.  HENT: Negative for congestion.   Eyes: Negative for discharge.  Respiratory: Negative for shortness of breath.   Cardiovascular: Negative for chest pain, palpitations and leg swelling.  Gastrointestinal: Negative for nausea, abdominal pain and diarrhea.  Genitourinary: Negative  for dysuria.  Musculoskeletal: Negative for falls.  Skin: Positive for rash.  Neurological: Negative for loss of consciousness and headaches.  Endo/Heme/Allergies: Negative for polydipsia.  Psychiatric/Behavioral: Positive for depression. Negative for suicidal ideas. The patient has insomnia. The patient is not nervous/anxious.     Objective  BP 147/84  Pulse 80  Temp 98.3 F (36.8 C) (Temporal)  Ht 5' 3.25" (1.607 m)  Wt 173 lb 12.8 oz (78.835 kg)  BMI 30.54 kg/m2  SpO2 96%  Physical Exam  Physical Exam  Constitutional: She is oriented to person, place, and time and well-developed, well-nourished, and in no distress. No distress.  HENT:  Head: Normocephalic and atraumatic.  Eyes: Conjunctivae are normal.  Neck: Neck supple. No thyromegaly present.  Cardiovascular: Normal rate, regular rhythm and normal heart sounds.   No murmur heard. Pulmonary/Chest: Effort normal and breath sounds normal. She has no wheezes.  Abdominal: She exhibits no distension and no mass.  Musculoskeletal: She exhibits no edema.  Lymphadenopathy:    She has no cervical adenopathy.  Neurological: She is alert and oriented to person, place, and time.  Skin: Skin is warm and dry. No rash noted. She is not diaphoretic.  Psychiatric: Memory and judgment normal.    Lab Results  Component Value Date   TSH 2.47 07/22/2011   Lab Results  Component Value Date   WBC 8.6 07/22/2011   HGB 14.4 07/22/2011   HCT 43.6 07/22/2011   MCV 96.2 07/22/2011   PLT 286.0 07/22/2011   Lab Results  Component Value Date   CREATININE 0.9 04/14/2009   BUN 21 04/14/2009   NA 140 04/14/2009   K 4.0 04/14/2009   CL 107 04/14/2009   CO2 32 04/13/2009     Assessment & Plan  Hypertension Mild elevation today but patient reports systolics in the 120s recently  Right shoulder strain Try moist heat, gentle stretching and Aspercreme and notify if no improvement  MRSA (methicillin resistant staph aureus) culture  positive Recently done at dermatology. No concerning lesions today, Bactroban to nares bid, 1 tbls bleach in bath weekly.  Candidal skin infection Ketoconazole cream bid  Grief reaction Patient just widowed on June 25, she is struggling but has plans to go visit her children soon. She has contacted Hospice and is goind to start grief counseling

## 2011-10-03 NOTE — Assessment & Plan Note (Signed)
Ketoconazole cream bid 

## 2011-10-03 NOTE — Assessment & Plan Note (Signed)
Mild elevation today but patient reports systolics in the 120s recently

## 2011-10-03 NOTE — Assessment & Plan Note (Signed)
Try moist heat, gentle stretching and Aspercreme and notify if no improvement

## 2011-10-04 ENCOUNTER — Encounter: Payer: Self-pay | Admitting: Family Medicine

## 2011-11-22 ENCOUNTER — Encounter: Payer: Self-pay | Admitting: Family Medicine

## 2011-11-22 ENCOUNTER — Ambulatory Visit (INDEPENDENT_AMBULATORY_CARE_PROVIDER_SITE_OTHER): Payer: Medicare Other | Admitting: Family Medicine

## 2011-11-22 VITALS — BP 148/88 | HR 74 | Temp 97.3°F | Ht 63.25 in | Wt 172.1 lb

## 2011-11-22 DIAGNOSIS — F4321 Adjustment disorder with depressed mood: Secondary | ICD-10-CM

## 2011-11-22 DIAGNOSIS — Z22322 Carrier or suspected carrier of Methicillin resistant Staphylococcus aureus: Secondary | ICD-10-CM

## 2011-11-22 DIAGNOSIS — B372 Candidiasis of skin and nail: Secondary | ICD-10-CM

## 2011-11-22 DIAGNOSIS — G4700 Insomnia, unspecified: Secondary | ICD-10-CM

## 2011-11-22 DIAGNOSIS — I10 Essential (primary) hypertension: Secondary | ICD-10-CM

## 2011-11-22 DIAGNOSIS — I701 Atherosclerosis of renal artery: Secondary | ICD-10-CM

## 2011-11-22 DIAGNOSIS — J309 Allergic rhinitis, unspecified: Secondary | ICD-10-CM

## 2011-11-22 DIAGNOSIS — F419 Anxiety disorder, unspecified: Secondary | ICD-10-CM

## 2011-11-22 DIAGNOSIS — F411 Generalized anxiety disorder: Secondary | ICD-10-CM

## 2011-11-22 HISTORY — DX: Allergic rhinitis, unspecified: J30.9

## 2011-11-22 MED ORDER — DIAZEPAM 5 MG PO TABS: 5.0000 mg | ORAL_TABLET | Freq: Two times a day (BID) | ORAL | Status: DC | PRN

## 2011-11-22 MED ORDER — FLUTICASONE PROPIONATE 50 MCG/ACT NA SUSP: 2.0000 | Freq: Every day | NASAL | Status: DC

## 2011-11-22 MED ORDER — SALINE NASAL SPRAY 0.65 % NA SOLN: 2.0000 | NASAL | Status: DC | PRN

## 2011-11-22 MED ORDER — LORATADINE 10 MG PO TABS: 10.0000 mg | ORAL_TABLET | Freq: Every day | ORAL | Status: DC | PRN

## 2011-11-22 NOTE — Patient Instructions (Addendum)

## 2011-11-22 NOTE — Assessment & Plan Note (Signed)
Started on nasal saline, Flonase and Claritin as directed

## 2011-11-24 ENCOUNTER — Encounter: Payer: Self-pay | Admitting: Family Medicine

## 2011-11-24 DIAGNOSIS — I701 Atherosclerosis of renal artery: Secondary | ICD-10-CM

## 2011-11-24 HISTORY — DX: Atherosclerosis of renal artery: I70.1

## 2011-11-24 NOTE — Assessment & Plan Note (Addendum)
It has only been a couple of months since her husband died. May try diazepam prn

## 2011-11-24 NOTE — Assessment & Plan Note (Signed)
Lesions under breasts greatly improved. She has started raw garlic and she feels that is making a difference.

## 2011-11-24 NOTE — Assessment & Plan Note (Signed)
He is not ready to proceed with further Ultrasound but she does wish to switch cardiologists when she is ready to proceed with further evaluation

## 2011-11-24 NOTE — Progress Notes (Signed)
Patient ID: Taylor Vaughn, female   DOB: Feb 24, 1930, 78 y.o.   MRN: [id number] Taylor Vaughn [id number] 1929-07-31 11/24/2011      Progress Note-Follow Up  Subjective  Chief Complaint  Chief Complaint  Patient presents with  . Follow-up    on MRSA    HPI  Patient is an 78 year old Caucasian female who is in today for followup. She continues to struggle with grief of losing her husband a couple months ago but is using hospice services and finds that helpful. Is seeing the blood pressure numbers at home generally systolic of 120s to 140s. Diastolics of 60s to 70s. He says he physically feels well. No mean things she notes it occasionally she still feels mildly dizzy. No falls and no true vertigo. She says is no correlation to position changes or exertion. No chest pain, palpitations, shortness of breath, GI or GU complaints. She does note some mild phlegm in her throat in the morning. She does also note she continues to have trouble sleeping 2 to her grief and anxiety. Trouble falling asleep and staying asleep  Past Medical History  Diagnosis Date  . Chicken pox as a child  . Measles 17  . Mumps as a child  . Whooping cough 7 yrs old  . Hypertension   . Hyperlipidemia   . Insomnia   . Colon polyps     adenomatous, recurrrent, due  again 12/13  . Urinary frequency 07/22/2011  . Acute chest wall pain 07/22/2011  . Preventative health care 07/22/2011  . Right shoulder strain 10/03/2011  . MRSA (methicillin resistant staph aureus) culture positive 10/03/2011  . Grief reaction 10/03/2011  . Candidal skin infection 10/03/2011  . Allergic rhinitis 11/22/2011    Past Surgical History  Procedure Date  . Cholecystectomy 1992  . Skin biopsy     multiple, all benign    Family History  Problem Relation Age of Onset  . Cancer Mother 40    breast- remission, late 55's lung cancer  . Hypertension Mother     ?  Marland Kitchen Angina Father   . Diabetes Father     type 2  . Heart disease Father   .  Obesity Sister   . Diabetes Sister     type 2  . Hyperlipidemia Sister   . Heart attack Sister   . Spina bifida Brother   . Constipation Daughter   . Hypertension Daughter   . Hyperlipidemia Daughter   . Obesity Son   . Hypertension Son   . Obesity Maternal Grandmother   . Heart attack Maternal Grandmother   . Stroke Paternal Grandfather     History   Social History  . Marital Status: Widowed    Spouse Name: N/A    Number of Children: N/A  . Years of Education: N/A   Occupational History  . Not on file.   Social History Main Topics  . Smoking status: Former Smoker    Types: Cigarettes  . Smokeless tobacco: Never Used   Comment: smoked for 3 years in 30's  . Alcohol Use: No  . Drug Use: No  . Sexually Active: No   Other Topics Concern  . Not on file   Social History Narrative  . No narrative on file    Current Outpatient Prescriptions on File Prior to Visit  Medication Sig Dispense Refill  . Calcium Carbonate-Vit D-Min (CALCIUM 1200 PO) Take 1 tablet by mouth 2 (two) times daily.      . fish oil-omega-3 fatty  acids 1000 MG capsule Take 2 g by mouth daily.      . Multiple Vitamin (MULTIVITAMIN) tablet Take 1 tablet by mouth daily.      . vitamin E 400 UNIT capsule Take 400 Units by mouth daily.      . fluticasone (FLONASE) 50 MCG/ACT nasal spray Place 2 sprays into the nose daily.  16 g  6  . ketoconazole (NIZORAL) 2 % cream Apply topically 2 (two) times daily.  15 g  0  . loratadine (CLARITIN) 10 MG tablet Take 1 tablet (10 mg total) by mouth daily as needed for allergies.  30 tablet  0  . mupirocin cream (BACTROBAN) 2 % Apply 1 application topically 3 (three) times daily.      . sodium chloride (AYR) 0.65 % nasal spray Place 2 sprays into the nose as needed for congestion (twice daily as needed).  30 mL  2    Allergies  Allergen Reactions  . Lisinopril Cough  . Statins     Muscle cramps, rashes, loss of hair  . Keflex (Cephalexin) Rash  . Losartan      phlegm    Review of Systems  Review of Systems  Constitutional: Negative for fever and malaise/fatigue.  HENT: Negative for congestion.   Eyes: Negative for discharge.  Respiratory: Negative for shortness of breath.   Cardiovascular: Negative for chest pain, palpitations and leg swelling.  Gastrointestinal: Negative for nausea, abdominal pain and diarrhea.  Genitourinary: Negative for dysuria.  Musculoskeletal: Negative for falls.  Skin: Negative for rash.  Neurological: Negative for loss of consciousness and headaches.  Endo/Heme/Allergies: Negative for polydipsia.  Psychiatric/Behavioral: Positive for depression. Negative for suicidal ideas. The patient is nervous/anxious and has insomnia.     Objective  BP 148/88  Pulse 74  Temp 97.3 F (36.3 C) (Temporal)  Ht 5' 3.25" (1.607 m)  Wt 172 lb 1.9 oz (78.073 kg)  BMI 30.25 kg/m2  SpO2 98%  Physical Exam  Physical Exam  Constitutional: She is oriented to person, place, and time and well-developed, well-nourished, and in no distress. No distress.  HENT:  Head: Normocephalic and atraumatic.  Eyes: Conjunctivae normal are normal.  Neck: Neck supple. No thyromegaly present.  Cardiovascular: Normal rate, regular rhythm and normal heart sounds.   No murmur heard. Pulmonary/Chest: Effort normal and breath sounds normal. She has no wheezes.  Abdominal: She exhibits no distension and no mass.  Musculoskeletal: She exhibits no edema.  Lymphadenopathy:    She has no cervical adenopathy.  Neurological: She is alert and oriented to person, place, and time.  Skin: Skin is warm and dry. No rash noted. She is not diaphoretic.  Psychiatric: Memory, affect and judgment normal.    Lab Results  Component Value Date   TSH 2.47 07/22/2011   Lab Results  Component Value Date   WBC 8.6 07/22/2011   HGB 14.4 07/22/2011   HCT 43.6 07/22/2011   MCV 96.2 07/22/2011   PLT 286.0 07/22/2011   Lab Results  Component Value Date   CREATININE  0.9 04/14/2009   BUN 21 04/14/2009   NA 140 04/14/2009   K 4.0 04/14/2009   CL 107 04/14/2009   CO2 32 04/13/2009     Assessment & Plan  Allergic rhinitis Started on nasal saline, Flonase and Claritin as directed  MRSA (methicillin resistant staph aureus) culture positive Lesions under breasts greatly improved. She has started raw garlic and she feels that is making a difference.  Candidal skin infection Resolved, may  continue to use meds prn, continue probiotics  Hypertension Patient encouraged to avoid sodium and notify us if any symptoms develop  Grief reaction It has only been a couple of months since her husband died. May try diazepam prn  Renal artery stenosis He is not ready to proceed with further Ultrasound but she does wish to switch cardiologists when she is ready to proceed with further evaluation

## 2011-11-24 NOTE — Assessment & Plan Note (Signed)
Patient encouraged to avoid sodium and notify us if any symptoms develop

## 2011-11-24 NOTE — Assessment & Plan Note (Signed)
Resolved, may continue to use meds prn, continue probiotics

## 2011-12-31 ENCOUNTER — Ambulatory Visit (INDEPENDENT_AMBULATORY_CARE_PROVIDER_SITE_OTHER): Payer: Medicare Other | Admitting: Family Medicine

## 2011-12-31 ENCOUNTER — Encounter: Payer: Self-pay | Admitting: Family Medicine

## 2011-12-31 VITALS — BP 160/100 | HR 74 | Temp 97.8°F | Ht 63.25 in | Wt 175.0 lb

## 2011-12-31 DIAGNOSIS — F4321 Adjustment disorder with depressed mood: Secondary | ICD-10-CM

## 2011-12-31 DIAGNOSIS — B372 Candidiasis of skin and nail: Secondary | ICD-10-CM

## 2011-12-31 DIAGNOSIS — R350 Frequency of micturition: Secondary | ICD-10-CM

## 2011-12-31 DIAGNOSIS — I10 Essential (primary) hypertension: Secondary | ICD-10-CM

## 2011-12-31 LAB — POCT URINALYSIS DIPSTICK
Blood, UA: NEGATIVE
Glucose, UA: NEGATIVE
Urobilinogen, UA: 0.2
pH, UA: 6

## 2011-12-31 NOTE — Patient Instructions (Addendum)

## 2012-01-01 NOTE — Assessment & Plan Note (Signed)
Resolved

## 2012-01-01 NOTE — Assessment & Plan Note (Signed)
Doing better at this time 

## 2012-01-01 NOTE — Assessment & Plan Note (Signed)
Numbers continue to be elevated but she declines meds noting her numbers are better elsewhere, she agrees to ongoing monitoring and further consideration.

## 2012-01-01 NOTE — Progress Notes (Signed)
Patient ID: Taylor Vaughn, female   DOB: 15-Feb-1930, 78 y.o.   MRN: [id number] Azharia Surratt [id number] 1930-02-25 01/01/2012      Progress Note-Follow Up  Subjective  Chief Complaint  Chief Complaint  Patient presents with  . Follow-up    multiple issues    HPI  Patient is an 78 year old Caucasian female who is in today for followup on her blood pressure. She denies headaches, chest pain, palpitations, shortness of breath, GI or GU complaints. Has been taking her blood pressure at home and has been taking mostly one 40s over 80s. She is very anxious today because she has another appointment to get to that she's been having disagreements with her daughter she does believe that's what her pressure is up today.  Past Medical History  Diagnosis Date  . Chicken pox as a child  . Measles 17  . Mumps as a child  . Whooping cough 7 yrs old  . Hypertension   . Hyperlipidemia   . Insomnia   . Colon polyps     adenomatous, recurrrent, due  again 12/13  . Urinary frequency 07/22/2011  . Acute chest wall pain 07/22/2011  . Preventative health care 07/22/2011  . Right shoulder strain 10/03/2011  . MRSA (methicillin resistant staph aureus) culture positive 10/03/2011  . Grief reaction 10/03/2011  . Candidal skin infection 10/03/2011  . Allergic rhinitis 11/22/2011  . Renal artery stenosis 11/24/2011    Past Surgical History  Procedure Date  . Cholecystectomy 1992  . Skin biopsy     multiple, all benign    Family History  Problem Relation Age of Onset  . Cancer Mother 87    breast- remission, late 45's lung cancer  . Hypertension Mother     ?  Marland Kitchen Angina Father   . Diabetes Father     type 2  . Heart disease Father   . Obesity Sister   . Diabetes Sister     type 2  . Hyperlipidemia Sister   . Heart attack Sister   . Spina bifida Brother   . Constipation Daughter   . Hypertension Daughter   . Hyperlipidemia Daughter   . Obesity Son   . Hypertension Son   . Obesity Maternal  Grandmother   . Heart attack Maternal Grandmother   . Stroke Paternal Grandfather     History   Social History  . Marital Status: Widowed    Spouse Name: N/A    Number of Children: N/A  . Years of Education: N/A   Occupational History  . Not on file.   Social History Main Topics  . Smoking status: Former Smoker    Types: Cigarettes  . Smokeless tobacco: Never Used   Comment: smoked for 3 years in 30's  . Alcohol Use: No  . Drug Use: No  . Sexually Active: No   Other Topics Concern  . Not on file   Social History Narrative  . No narrative on file    Current Outpatient Prescriptions on File Prior to Visit  Medication Sig Dispense Refill  . Calcium Carbonate-Vit D-Min (CALCIUM 1200 PO) Take 1 tablet by mouth 2 (two) times daily.      Marland Kitchen co-enzyme Q-10 30 MG capsule Take 30 mg by mouth daily.      . diazepam (VALIUM) 5 MG tablet Take 1 tablet (5 mg total) by mouth 2 (two) times daily as needed for anxiety or sleep.  60 tablet  2  . fish oil-omega-3 fatty acids 1000 MG  capsule Take 2 g by mouth daily.      . fluticasone (FLONASE) 50 MCG/ACT nasal spray Place 2 sprays into the nose daily.  16 g  6  . GARLIC PO Take by mouth.      Marland Kitchen ketoconazole (NIZORAL) 2 % cream Apply topically 2 (two) times daily.  15 g  0  . loratadine (CLARITIN) 10 MG tablet Take 1 tablet (10 mg total) by mouth daily as needed for allergies.  30 tablet  0  . Multiple Vitamin (MULTIVITAMIN) tablet Take 1 tablet by mouth daily.      . mupirocin cream (BACTROBAN) 2 % Apply 1 application topically 3 (three) times daily.      . niacin 500 MG tablet Take 500 mg by mouth daily with breakfast.      . NON FORMULARY 2-3 cloves of raw garlic daily      . Probiotic Product (PHILLIPS COLON HEALTH PO) Take by mouth.      . Red Yeast Rice 600 MG CAPS Take by mouth.      . sodium chloride (AYR) 0.65 % nasal spray Place 2 sprays into the nose as needed for congestion (twice daily as needed).  30 mL  2  . Soya Lecithin  1200 MG CAPS Take 2,400 mg by mouth daily.      . vitamin E 400 UNIT capsule Take 400 Units by mouth daily.        Allergies  Allergen Reactions  . Lisinopril Cough  . Statins     Muscle cramps, rashes, loss of hair  . Keflex (Cephalexin) Rash  . Losartan     phlegm    Review of Systems  Review of Systems  Constitutional: Negative for fever and malaise/fatigue.  HENT: Negative for congestion.   Eyes: Negative for discharge.  Respiratory: Negative for shortness of breath.   Cardiovascular: Negative for chest pain, palpitations and leg swelling.  Gastrointestinal: Negative for nausea, abdominal pain and diarrhea.  Genitourinary: Negative for dysuria.  Musculoskeletal: Negative for falls.  Skin: Negative for rash.  Neurological: Negative for loss of consciousness and headaches.  Endo/Heme/Allergies: Negative for polydipsia.  Psychiatric/Behavioral: Negative for depression and suicidal ideas. The patient is nervous/anxious and has insomnia.     Objective  BP 160/100  Pulse 74  Temp 97.8 F (36.6 C) (Temporal)  Ht 5' 3.25" (1.607 m)  Wt 175 lb (79.379 kg)  BMI 30.76 kg/m2  SpO2 93%  Physical Exam  Physical Exam  Constitutional: She is oriented to person, place, and time and well-developed, well-nourished, and in no distress. No distress.  HENT:  Head: Normocephalic and atraumatic.  Eyes: Conjunctivae normal are normal.  Neck: Neck supple. No thyromegaly present.  Cardiovascular: Normal rate, regular rhythm and normal heart sounds.   No murmur heard. Pulmonary/Chest: Effort normal and breath sounds normal. She has no wheezes.  Abdominal: She exhibits no distension and no mass.  Musculoskeletal: She exhibits no edema.  Lymphadenopathy:    She has no cervical adenopathy.  Neurological: She is alert and oriented to person, place, and time.  Skin: Skin is warm and dry. No rash noted. She is not diaphoretic.  Psychiatric: Memory, affect and judgment normal.    Lab  Results  Component Value Date   TSH 2.47 07/22/2011   Lab Results  Component Value Date   WBC 8.6 07/22/2011   HGB 14.4 07/22/2011   HCT 43.6 07/22/2011   MCV 96.2 07/22/2011   PLT 286.0 07/22/2011   Lab Results  Component Value Date   CREATININE 0.9 04/14/2009   BUN 21 04/14/2009   NA 140 04/14/2009   K 4.0 04/14/2009   CL 107 04/14/2009   CO2 32 04/13/2009    Assessment & Plan  Hypertension Numbers continue to be elevated but she declines meds noting her numbers are better elsewhere, she agrees to ongoing monitoring and further consideration.  Candidal skin infection Resolved   Grief reaction Doing better at this time.

## 2012-01-03 NOTE — Progress Notes (Signed)
Quick Note:  Patient Informed and voiced understanding ______ 

## 2012-01-07 ENCOUNTER — Other Ambulatory Visit: Payer: Self-pay | Admitting: Emergency Medicine

## 2012-01-07 MED ORDER — METOPROLOL SUCCINATE ER 25 MG PO TB24: 25.0000 mg | ORAL_TABLET | Freq: Every day | ORAL | Status: DC

## 2012-01-07 NOTE — Telephone Encounter (Signed)
Patient states she develops cough with lisinopril & losartan makes her sick. Her bp this morning is 160/92. Last night her systolic was 180. She doesn't see well when it gets dark so she needs to be able to drive early as possible today. Advised patient Rx would be sent to the pharmacy before 5PM today.

## 2012-01-07 NOTE — Telephone Encounter (Signed)
Patient needs medication for bp it was elevated lastnight. [facility] [phone] please contact pt when it is called in

## 2012-01-07 NOTE — Telephone Encounter (Signed)
RX sent and pt informed 

## 2012-01-07 NOTE — Telephone Encounter (Signed)
Please advise 

## 2012-01-07 NOTE — Telephone Encounter (Signed)
Metoprolol Xr 25 mg daily, #30, 1 rf needs to come in for bp check in 2-3 weeks

## 2012-01-10 ENCOUNTER — Ambulatory Visit (INDEPENDENT_AMBULATORY_CARE_PROVIDER_SITE_OTHER): Payer: Medicare Other | Admitting: Family Medicine

## 2012-01-10 ENCOUNTER — Encounter: Payer: Self-pay | Admitting: Family Medicine

## 2012-01-10 VITALS — BP 132/82 | HR 64 | Temp 98.2°F | Ht 63.25 in | Wt 174.0 lb

## 2012-01-10 DIAGNOSIS — I10 Essential (primary) hypertension: Secondary | ICD-10-CM

## 2012-01-10 DIAGNOSIS — Z0000 Encounter for general adult medical examination without abnormal findings: Secondary | ICD-10-CM

## 2012-01-10 DIAGNOSIS — F4321 Adjustment disorder with depressed mood: Secondary | ICD-10-CM

## 2012-01-10 NOTE — Patient Instructions (Addendum)
MONITOR BP, REST FOR 10 MINUTES. 165/95 OR LESS MOST READINGS, CALL IF HIGHER CONSISTENTLY CALL. MONITOR PULSE AND NOTIFY us IF RUNNING LESS THAN 55  Hypertension As your heart beats, it forces blood through your arteries. This force is your blood pressure. If the pressure is too high, it is called hypertension (HTN) or high blood pressure. HTN is dangerous because you may have it and not know it. High blood pressure may mean that your heart has to work harder to pump blood. Your arteries may be narrow or stiff. The extra work puts you at risk for heart disease, stroke, and other problems.  Blood pressure consists of two numbers, a higher number over a lower, 110/72, for example. It is stated as "110 over 72." The ideal is below 120 for the top number (systolic) and under 80 for the bottom (diastolic). Write down your blood pressure today. You should pay close attention to your blood pressure if you have certain conditions such as:  Heart failure.  Prior heart attack.  Diabetes  Chronic kidney disease.  Prior stroke.  Multiple risk factors for heart disease. To see if you have HTN, your blood pressure should be measured while you are seated with your arm held at the level of the heart. It should be measured at least twice. A one-time elevated blood pressure reading (especially in the Emergency Department) does not mean that you need treatment. There may be conditions in which the blood pressure is different between your right and left arms. It is important to see your caregiver soon for a recheck. Most people have essential hypertension which means that there is not a specific cause. This type of high blood pressure may be lowered by changing lifestyle factors such as:  Stress.  Smoking.  Lack of exercise.  Excessive weight.  Drug/tobacco/alcohol use.  Eating less salt. Most people do not have symptoms from high blood pressure until it has caused damage to the body. Effective  treatment can often prevent, delay or reduce that damage. TREATMENT  When a cause has been identified, treatment for high blood pressure is directed at the cause. There are a large number of medications to treat HTN. These fall into several categories, and your caregiver will help you select the medicines that are best for you. Medications may have side effects. You should review side effects with your caregiver. If your blood pressure stays high after you have made lifestyle changes or started on medicines,   Your medication(s) may need to be changed.  Other problems may need to be addressed.  Be certain you understand your prescriptions, and know how and when to take your medicine.  Be sure to follow up with your caregiver within the time frame advised (usually within two weeks) to have your blood pressure rechecked and to review your medications.  If you are taking more than one medicine to lower your blood pressure, make sure you know how and at what times they should be taken. Taking two medicines at the same time can result in blood pressure that is too low. SEEK IMMEDIATE MEDICAL CARE IF:  You develop a severe headache, blurred or changing vision, or confusion.  You have unusual weakness or numbness, or a faint feeling.  You have severe chest or abdominal pain, vomiting, or breathing problems. MAKE SURE YOU:   Understand these instructions.  Will watch your condition.  Will get help right away if you are not doing well or get worse. Document Released: 02/18/2005 Document  Revised: 05/13/2011 Document Reviewed: 10/09/2007 [facility] Patient Information 2013 [place], [place].

## 2012-01-10 NOTE — Assessment & Plan Note (Signed)
Patient agrees for annual labs and gyn visit in February 2014

## 2012-01-10 NOTE — Assessment & Plan Note (Signed)
Still struggling with the loss of her husband. Thanksgiving will be her wedding anniversary she is going to go visit family in [place] to try and get through the holidays

## 2012-01-10 NOTE — Assessment & Plan Note (Addendum)
Improved on recheck, continue Metoprolol XL 25 mg daily and recheck BP and pulse in 3 weeks,

## 2012-01-10 NOTE — Progress Notes (Signed)
Patient ID: Taylor Vaughn, female   DOB: 18-Jan-1930, 78 y.o.   MRN: [id number] Vlasta Baskin [id number] [birth year] 01/10/2012      Progress Note-Follow Up  Subjective  Chief Complaint  Chief Complaint  Patient presents with  . Hypertension    just started medications and doesn't feel good    HPI  This is an 78 year old Caucasian female who is in today for evaluation of her blood pressure. She reports she's only been taking the metoprolol for 3 days now but is still seeing numbers in the 160s to 180s. She did have one reading of 118. She notes that if her pulse is in the 60s. So far she's not had any untoward consequences. She denies headache, chest pain, constipation, palpitations, shortness of breath, increased fatigue or other new concerns. She continues to struggle with the stress of losing her husband but overall feels she's handling it well at this time.  Past Medical History  Diagnosis Date  . Chicken pox as a child  . Measles 17  . Mumps as a child  . Whooping cough 7 yrs old  . Hypertension   . Hyperlipidemia   . Insomnia   . Colon polyps     adenomatous, recurrrent, due  again 12/13  . Urinary frequency 07/22/2011  . Acute chest wall pain 07/22/2011  . Preventative health care 07/22/2011  . Right shoulder strain 10/03/2011  . MRSA (methicillin resistant staph aureus) culture positive 10/03/2011  . Grief reaction 10/03/2011  . Candidal skin infection 10/03/2011  . Allergic rhinitis 11/22/2011  . Renal artery stenosis 11/24/2011    Past Surgical History  Procedure Date  . Cholecystectomy 1992  . Skin biopsy     multiple, all benign    Family History  Problem Relation Age of Onset  . Cancer Mother 68    breast- remission, late 36's lung cancer  . Hypertension Mother     ?  Marland Kitchen Angina Father   . Diabetes Father     type 2  . Heart disease Father   . Obesity Sister   . Diabetes Sister     type 2  . Hyperlipidemia Sister   . Heart attack Sister   . Spina bifida  Brother   . Constipation Daughter   . Hypertension Daughter   . Hyperlipidemia Daughter   . Obesity Son   . Hypertension Son   . Obesity Maternal Grandmother   . Heart attack Maternal Grandmother   . Stroke Paternal Grandfather     History   Social History  . Marital Status: Widowed    Spouse Name: N/A    Number of Children: N/A  . Years of Education: N/A   Occupational History  . Not on file.   Social History Main Topics  . Smoking status: Former Smoker    Types: Cigarettes  . Smokeless tobacco: Never Used     Comment: smoked for 3 years in 30's  . Alcohol Use: No  . Drug Use: No  . Sexually Active: No   Other Topics Concern  . Not on file   Social History Narrative  . No narrative on file    Current Outpatient Prescriptions on File Prior to Visit  Medication Sig Dispense Refill  . Calcium Carbonate-Vit D-Min (CALCIUM 1200 PO) Take 1 tablet by mouth 2 (two) times daily.      Marland Kitchen co-enzyme Q-10 30 MG capsule Take 30 mg by mouth daily.      . diazepam (VALIUM) 5 MG tablet Take  1 tablet (5 mg total) by mouth 2 (two) times daily as needed for anxiety or sleep.  60 tablet  2  . fish oil-omega-3 fatty acids 1000 MG capsule Take 2 g by mouth daily.      . fluticasone (FLONASE) 50 MCG/ACT nasal spray Place 2 sprays into the nose daily.  16 g  6  . GARLIC PO Take by mouth.      Marland Kitchen ketoconazole (NIZORAL) 2 % cream Apply topically 2 (two) times daily.  15 g  0  . loratadine (CLARITIN) 10 MG tablet Take 1 tablet (10 mg total) by mouth daily as needed for allergies.  30 tablet  0  . metoprolol succinate (TOPROL-XL) 25 MG 24 hr tablet Take 1 tablet (25 mg total) by mouth daily.  30 tablet  1  . Multiple Vitamin (MULTIVITAMIN) tablet Take 1 tablet by mouth daily.      . mupirocin cream (BACTROBAN) 2 % Apply 1 application topically 3 (three) times daily.      . niacin 500 MG tablet Take 500 mg by mouth daily with breakfast.      . NON FORMULARY 2-3 cloves of raw garlic daily      .  Probiotic Product (PHILLIPS COLON HEALTH PO) Take by mouth.      . Red Yeast Rice 600 MG CAPS Take by mouth.      . sodium chloride (AYR) 0.65 % nasal spray Place 2 sprays into the nose as needed for congestion (twice daily as needed).  30 mL  2  . Soya Lecithin 1200 MG CAPS Take 2,400 mg by mouth daily.      . vitamin E 400 UNIT capsule Take 400 Units by mouth daily.        Allergies  Allergen Reactions  . Lisinopril Cough  . Statins     Muscle cramps, rashes, loss of hair  . Keflex (Cephalexin) Rash  . Losartan     phlegm    Review of Systems  Review of Systems  Constitutional: Negative for fever and malaise/fatigue.  HENT: Negative for congestion.   Eyes: Negative for discharge.  Respiratory: Negative for shortness of breath.   Cardiovascular: Negative for chest pain, palpitations and leg swelling.  Gastrointestinal: Negative for nausea, abdominal pain and diarrhea.  Genitourinary: Negative for dysuria.  Musculoskeletal: Negative for falls.  Skin: Negative for rash.  Neurological: Negative for loss of consciousness and headaches.  Endo/Heme/Allergies: Negative for polydipsia.  Psychiatric/Behavioral: Negative for depression and suicidal ideas. The patient is not nervous/anxious and does not have insomnia.     Objective  BP 132/82  Pulse 64  Temp 98.2 F (36.8 C) (Temporal)  Ht 5' 3.25" (1.607 m)  Wt 174 lb (78.926 kg)  BMI 30.58 kg/m2  SpO2 97%  Physical Exam  Physical Exam  Constitutional: She is oriented to person, place, and time and well-developed, well-nourished, and in no distress. No distress.  HENT:  Head: Normocephalic and atraumatic.  Eyes: Conjunctivae normal are normal.  Neck: Neck supple. No thyromegaly present.  Cardiovascular: Normal rate, regular rhythm and normal heart sounds.   No murmur heard. Pulmonary/Chest: Effort normal and breath sounds normal. She has no wheezes.  Abdominal: She exhibits no distension and no mass.  Musculoskeletal:  She exhibits no edema.  Lymphadenopathy:    She has no cervical adenopathy.  Neurological: She is alert and oriented to person, place, and time.  Skin: Skin is warm and dry. No rash noted. She is not diaphoretic.  Psychiatric:  Memory, affect and judgment normal.    Lab Results  Component Value Date   TSH 2.47 07/22/2011   Lab Results  Component Value Date   WBC 8.6 07/22/2011   HGB 14.4 07/22/2011   HCT 43.6 07/22/2011   MCV 96.2 07/22/2011   PLT 286.0 07/22/2011   Lab Results  Component Value Date   CREATININE 0.9 04/14/2009   BUN 21 04/14/2009   NA 140 04/14/2009   K 4.0 04/14/2009   CL 107 04/14/2009   CO2 32 04/13/2009     Assessment & Plan  Hypertension Improved on recheck, continue Metoprolol XL 25 mg daily and recheck BP and pulse in 3 weeks,   Grief reaction Still struggling with the loss of her husband. Thanksgiving will be her wedding anniversary she is going to go visit family in [place] to try and get through the holidays  Preventative health care Patient agrees for annual labs and gyn visit in February 2014

## 2012-02-03 ENCOUNTER — Telehealth: Payer: Self-pay

## 2012-02-03 NOTE — Telephone Encounter (Signed)
Pt left a message stating that she is coming in on Friday 02-07-12 for a nurse BP check. Pt states she is going to run out of her medication on Thursday. Pt stated that she is afraid to get a refill because her BP is still running in the 150's . Do you want patient to come in on Thursday?

## 2012-02-04 NOTE — Telephone Encounter (Signed)
Patient informed and appt made for follow up with MD due to other concerns

## 2012-02-04 NOTE — Telephone Encounter (Signed)
Sure have her come Weds or Thurs morning. If she waits till Thursday make sure she has a pill to take that day. I want to see her BP on a dose of the medication

## 2012-02-06 ENCOUNTER — Ambulatory Visit (INDEPENDENT_AMBULATORY_CARE_PROVIDER_SITE_OTHER): Payer: Medicare Other | Admitting: Family Medicine

## 2012-02-06 ENCOUNTER — Encounter: Payer: Self-pay | Admitting: Family Medicine

## 2012-02-06 VITALS — BP 125/86 | HR 75 | Temp 97.7°F | Ht 63.25 in | Wt 175.4 lb

## 2012-02-06 DIAGNOSIS — F4320 Adjustment disorder, unspecified: Secondary | ICD-10-CM

## 2012-02-06 DIAGNOSIS — I10 Essential (primary) hypertension: Secondary | ICD-10-CM

## 2012-02-06 DIAGNOSIS — S46911A Strain of unspecified muscle, fascia and tendon at shoulder and upper arm level, right arm, initial encounter: Secondary | ICD-10-CM

## 2012-02-06 DIAGNOSIS — IMO0002 Reserved for concepts with insufficient information to code with codable children: Secondary | ICD-10-CM

## 2012-02-06 DIAGNOSIS — F4321 Adjustment disorder with depressed mood: Secondary | ICD-10-CM

## 2012-02-06 MED ORDER — METOPROLOL SUCCINATE ER 25 MG PO TB24: 25.0000 mg | ORAL_TABLET | Freq: Every day | ORAL | Status: DC

## 2012-02-06 NOTE — Assessment & Plan Note (Signed)
Well controlled on current meds, no changes. Have her bring her bp cuff to visit to compare readings. Minimize sodium

## 2012-02-06 NOTE — Assessment & Plan Note (Signed)
Still grieving the loss of her husband but is doing much better. Just had a nice visit with her son in [place] for the holiday

## 2012-02-06 NOTE — Progress Notes (Signed)
Patient ID: Taylor Vaughn, female   DOB: Nov 27, 1929, 78 y.o.   MRN: [id number] Taylor Vaughn [id number] June 25, 1929 02/06/2012      Progress Note-Follow Up  Subjective  Chief Complaint  Chief Complaint  Patient presents with  . Follow-up    on BP   . Shoulder Pain    off and on right shoulder and arm pain    HPI  Patient is an 78 year old Caucasian female who is here today for followup on her blood pressure. Overall she's doing well. She denies any recent illness, fevers, chills, chest pain, palpitations, shortness of breath, GI or GU complaints. She checks her blood pressure at home it tends to run higher than were seen here. She reports mostly taking it and seeing a systolic of 180. He says in the evening she often still feels lightheaded. She technologist she's very anxious prior to checking her blood pressure believes that puts up some. Her other complaint is persistent right shoulder pain. She's tried to rest it but has intermittent trouble up the right side of her neck into her right shoulder and down to the right biceps. She does not have limited range of motion but she's had difficulty with this in the past. In the past she's proceeded with chiropractic adjustment and had a great improvement in her shoulder. No symptoms down to the hand or recent trauma are noted.  Past Medical History  Diagnosis Date  . Chicken pox as a child  . Measles 17  . Mumps as a child  . Whooping cough 7 yrs old  . Hypertension   . Hyperlipidemia   . Insomnia   . Colon polyps     adenomatous, recurrrent, due  again 12/13  . Urinary frequency 07/22/2011  . Acute chest wall pain 07/22/2011  . Preventative health care 07/22/2011  . Right shoulder strain 10/03/2011  . MRSA (methicillin resistant staph aureus) culture positive 10/03/2011  . Grief reaction 10/03/2011  . Candidal skin infection 10/03/2011  . Allergic rhinitis 11/22/2011  . Renal artery stenosis 11/24/2011    Past Surgical History   Procedure Date  . Cholecystectomy 1992  . Skin biopsy     multiple, all benign    Family History  Problem Relation Age of Onset  . Cancer Mother 26    breast- remission, late 52's lung cancer  . Hypertension Mother     ?  Marland Kitchen Angina Father   . Diabetes Father     type 2  . Heart disease Father   . Obesity Sister   . Diabetes Sister     type 2  . Hyperlipidemia Sister   . Heart attack Sister   . Spina bifida Brother   . Constipation Daughter   . Hypertension Daughter   . Hyperlipidemia Daughter   . Obesity Son   . Hypertension Son   . Obesity Maternal Grandmother   . Heart attack Maternal Grandmother   . Stroke Paternal Grandfather     History   Social History  . Marital Status: Widowed    Spouse Name: N/A    Number of Children: N/A  . Years of Education: N/A   Occupational History  . Not on file.   Social History Main Topics  . Smoking status: Former Smoker    Types: Cigarettes  . Smokeless tobacco: Never Used     Comment: smoked for 3 years in 30's  . Alcohol Use: No  . Drug Use: No  . Sexually Active: No   Other Topics  Concern  . Not on file   Social History Narrative  . No narrative on file    Current Outpatient Prescriptions on File Prior to Visit  Medication Sig Dispense Refill  . Calcium Carbonate-Vit D-Min (CALCIUM 1200 PO) Take 1 tablet by mouth 2 (two) times daily.      Marland Kitchen co-enzyme Q-10 30 MG capsule Take 30 mg by mouth daily.      . diazepam (VALIUM) 5 MG tablet Take 1 tablet (5 mg total) by mouth 2 (two) times daily as needed for anxiety or sleep.  60 tablet  2  . fish oil-omega-3 fatty acids 1000 MG capsule Take 2 g by mouth daily.      . fluticasone (FLONASE) 50 MCG/ACT nasal spray Place 2 sprays into the nose daily.  16 g  6  . GARLIC PO Take by mouth.      . loratadine (CLARITIN) 10 MG tablet Take 1 tablet (10 mg total) by mouth daily as needed for allergies.  30 tablet  0  . Multiple Vitamin (MULTIVITAMIN) tablet Take 1 tablet by  mouth daily.      . niacin 500 MG tablet Take 500 mg by mouth daily with breakfast.      . NON FORMULARY 2-3 cloves of raw garlic daily      . POTASSIUM CITRATE PO Take 90 mg by mouth.      . Probiotic Product (PHILLIPS COLON HEALTH PO) Take by mouth.      . Red Yeast Rice 600 MG CAPS Take by mouth.      . sodium chloride (AYR) 0.65 % nasal spray Place 2 sprays into the nose as needed for congestion (twice daily as needed).  30 mL  2  . Soya Lecithin 1200 MG CAPS Take 2,400 mg by mouth daily.      . vitamin E 400 UNIT capsule Take 400 Units by mouth daily.      . [DISCONTINUED] metoprolol succinate (TOPROL-XL) 25 MG 24 hr tablet Take 1 tablet (25 mg total) by mouth daily.  30 tablet  1  . ketoconazole (NIZORAL) 2 % cream Apply topically 2 (two) times daily.  15 g  0    Allergies  Allergen Reactions  . Lisinopril Cough  . Statins     Muscle cramps, rashes, loss of hair  . Keflex (Cephalexin) Rash  . Losartan     phlegm    Review of Systems  Review of Systems  Constitutional: Negative for fever and malaise/fatigue.  HENT: Negative for congestion.   Eyes: Negative for discharge.  Respiratory: Negative for shortness of breath.   Cardiovascular: Negative for chest pain, palpitations and leg swelling.  Gastrointestinal: Negative for nausea, abdominal pain and diarrhea.  Genitourinary: Negative for dysuria.  Musculoskeletal: Negative for falls.  Skin: Negative for rash.  Neurological: Negative for loss of consciousness and headaches.  Endo/Heme/Allergies: Negative for polydipsia.  Psychiatric/Behavioral: Negative for depression and suicidal ideas. The patient is not nervous/anxious and does not have insomnia.     Objective  BP 125/86  Pulse 75  Temp 97.7 F (36.5 C) (Temporal)  Ht 5' 3.25" (1.607 m)  Wt 175 lb 6.4 oz (79.561 kg)  BMI 30.83 kg/m2  SpO2 97%  Physical Exam  Physical Exam  Constitutional: She is oriented to person, place, and time and well-developed,  well-nourished, and in no distress. No distress.  HENT:  Head: Normocephalic and atraumatic.  Eyes: Conjunctivae normal are normal.  Neck: Neck supple. No thyromegaly present.  Cardiovascular:  Normal rate, regular rhythm and normal heart sounds.   No murmur heard. Pulmonary/Chest: Effort normal and breath sounds normal. She has no wheezes.  Abdominal: She exhibits no distension and no mass.  Musculoskeletal: Normal range of motion. She exhibits tenderness. She exhibits no edema.       Pain with palp over right SCM and right bicep as well as anterior shoulder  Lymphadenopathy:    She has no cervical adenopathy.  Neurological: She is alert and oriented to person, place, and time.  Skin: Skin is warm and dry. No rash noted. She is not diaphoretic.  Psychiatric: Memory, affect and judgment normal.    Lab Results  Component Value Date   TSH 2.47 07/22/2011   Lab Results  Component Value Date   WBC 8.6 07/22/2011   HGB 14.4 07/22/2011   HCT 43.6 07/22/2011   MCV 96.2 07/22/2011   PLT 286.0 07/22/2011   Lab Results  Component Value Date   CREATININE 0.9 04/14/2009   BUN 21 04/14/2009   NA 140 04/14/2009   K 4.0 04/14/2009   CL 107 04/14/2009   CO2 32 04/13/2009     Assessment & Plan  Hypertension Well controlled on current meds, no changes. Have her bring her bp cuff to visit to compare readings. Minimize sodium  Right shoulder strain Comes and goes, try a course of physical therapy with [facility] physical therapy, try Aspercreme prn  Grief reaction Still grieving the loss of her husband but is doing much better. Just had a nice visit with her son in [place] for the holiday

## 2012-02-06 NOTE — Patient Instructions (Addendum)
Start Tums 1 after bedtime and 2 at bedtime  Gastroesophageal Reflux Disease, Adult Gastroesophageal reflux disease (GERD) happens when acid from your stomach flows up into the esophagus. When acid comes in contact with the esophagus, the acid causes soreness (inflammation) in the esophagus. Over time, GERD may create small holes (ulcers) in the lining of the esophagus. CAUSES   Increased body weight. This puts pressure on the stomach, making acid rise from the stomach into the esophagus.  Smoking. This increases acid production in the stomach.  Drinking alcohol. This causes decreased pressure in the lower esophageal sphincter (valve or ring of muscle between the esophagus and stomach), allowing acid from the stomach into the esophagus.  Late evening meals and a full stomach. This increases pressure and acid production in the stomach.  A malformed lower esophageal sphincter. Sometimes, no cause is found. SYMPTOMS   Burning pain in the lower part of the mid-chest behind the breastbone and in the mid-stomach area. This may occur twice a week or more often.  Trouble swallowing.  Sore throat.  Dry cough.  Asthma-like symptoms including chest tightness, shortness of breath, or wheezing. DIAGNOSIS  Your caregiver may be able to diagnose GERD based on your symptoms. In some cases, X-rays and other tests may be done to check for complications or to check the condition of your stomach and esophagus. TREATMENT  Your caregiver may recommend over-the-counter or prescription medicines to help decrease acid production. Ask your caregiver before starting or adding any new medicines.  HOME CARE INSTRUCTIONS   Change the factors that you can control. Ask your caregiver for guidance concerning weight loss, quitting smoking, and alcohol consumption.  Avoid foods and drinks that make your symptoms worse, such as:  Caffeine or alcoholic drinks.  Chocolate.  Peppermint or mint  flavorings.  Garlic and onions.  Spicy foods.  Citrus fruits, such as oranges, lemons, or limes.  Tomato-based foods such as sauce, chili, salsa, and pizza.  Fried and fatty foods.  Avoid lying down for the 3 hours prior to your bedtime or prior to taking a nap.  Eat small, frequent meals instead of large meals.  Wear loose-fitting clothing. Do not wear anything tight around your waist that causes pressure on your stomach.  Raise the head of your bed 6 to 8 inches with wood blocks to help you sleep. Extra pillows will not help.  Only take over-the-counter or prescription medicines for pain, discomfort, or fever as directed by your caregiver.  Do not take aspirin, ibuprofen, or other nonsteroidal anti-inflammatory drugs (NSAIDs). SEEK IMMEDIATE MEDICAL CARE IF:   You have pain in your arms, neck, jaw, teeth, or back.  Your pain increases or changes in intensity or duration.  You develop nausea, vomiting, or sweating (diaphoresis).  You develop shortness of breath, or you faint.  Your vomit is green, yellow, black, or looks like coffee grounds or blood.  Your stool is red, bloody, or black. These symptoms could be signs of other problems, such as heart disease, gastric bleeding, or esophageal bleeding. MAKE SURE YOU:   Understand these instructions.  Will watch your condition.  Will get help right away if you are not doing well or get worse. Document Released: 11/28/2004 Document Revised: 05/13/2011 Document Reviewed: 09/07/2010 [facility] Patient Information 2013 [place], [place].

## 2012-02-06 NOTE — Assessment & Plan Note (Addendum)
Comes and goes, try a course of physical therapy with [facility] physical therapy, try Aspercreme prn

## 2012-02-27 ENCOUNTER — Other Ambulatory Visit: Payer: Self-pay

## 2012-02-27 DIAGNOSIS — I10 Essential (primary) hypertension: Secondary | ICD-10-CM

## 2012-02-27 MED ORDER — METOPROLOL SUCCINATE ER 25 MG PO TB24: 25.0000 mg | ORAL_TABLET | Freq: Every day | ORAL | Status: DC

## 2012-03-10 ENCOUNTER — Telehealth: Payer: Self-pay | Admitting: Family Medicine

## 2012-03-10 NOTE — Telephone Encounter (Signed)
OK 

## 2012-03-10 NOTE — Telephone Encounter (Signed)
In reference to Physical Therapy referral entered 02/06/12, patient has declined.  Per my call to patient today, she does not want physical therapy.  States she has been doing her own exercises and her shoulder is doing much better.

## 2012-03-19 ENCOUNTER — Ambulatory Visit (INDEPENDENT_AMBULATORY_CARE_PROVIDER_SITE_OTHER): Payer: Medicare Other | Admitting: Family Medicine

## 2012-03-19 ENCOUNTER — Encounter: Payer: Self-pay | Admitting: Family Medicine

## 2012-03-19 ENCOUNTER — Other Ambulatory Visit (HOSPITAL_COMMUNITY)
Admission: RE | Admit: 2012-03-19 | Discharge: 2012-03-19 | Disposition: A | Discharge status: Home / Self Care | Payer: Medicare Other | Source: Ambulatory Visit | Attending: Family Medicine | Admitting: Family Medicine

## 2012-03-19 ENCOUNTER — Telehealth: Payer: Self-pay | Admitting: Family Medicine

## 2012-03-19 VITALS — BP 123/82 | HR 68 | Temp 97.6°F | Ht 63.25 in | Wt 171.1 lb

## 2012-03-19 DIAGNOSIS — D126 Benign neoplasm of colon, unspecified: Secondary | ICD-10-CM

## 2012-03-19 DIAGNOSIS — Z124 Encounter for screening for malignant neoplasm of cervix: Secondary | ICD-10-CM | POA: Insufficient documentation

## 2012-03-19 DIAGNOSIS — K635 Polyp of colon: Secondary | ICD-10-CM

## 2012-03-19 DIAGNOSIS — E785 Hyperlipidemia, unspecified: Secondary | ICD-10-CM

## 2012-03-19 DIAGNOSIS — I10 Essential (primary) hypertension: Secondary | ICD-10-CM

## 2012-03-19 DIAGNOSIS — Z0000 Encounter for general adult medical examination without abnormal findings: Secondary | ICD-10-CM

## 2012-03-19 HISTORY — DX: Encounter for screening for malignant neoplasm of cervix: Z12.4

## 2012-03-19 LAB — RENAL FUNCTION PANEL
Albumin: 4 g/dL (ref 3.5–5.2)
BUN: 21 mg/dL (ref 6–23)
CO2: 30 mEq/L (ref 19–32)
Calcium: 10.1 mg/dL (ref 8.4–10.5)
Chloride: 103 mEq/L (ref 96–112)

## 2012-03-19 LAB — HEPATIC FUNCTION PANEL
ALT: 22 U/L (ref 0–35)
AST: 20 U/L (ref 0–37)
Albumin: 4 g/dL (ref 3.5–5.2)
Alkaline Phosphatase: 47 U/L (ref 39–117)
Total Protein: 6.7 g/dL (ref 6.0–8.3)

## 2012-03-19 LAB — CBC
HCT: 43.5 % (ref 36.0–46.0)
Hemoglobin: 14.6 g/dL (ref 12.0–15.0)
RBC: 4.59 Mil/uL (ref 3.87–5.11)
WBC: 6.8 10*3/uL (ref 4.5–10.5)

## 2012-03-19 LAB — LIPID PANEL
Cholesterol: 257 mg/dL — ABNORMAL HIGH (ref 0–200)
Total CHOL/HDL Ratio: 6

## 2012-03-19 NOTE — Assessment & Plan Note (Signed)
Repeat colonoscopy will repeat in 2 years.

## 2012-03-19 NOTE — Telephone Encounter (Signed)
Patient is requesting her todays 03/19/12 lab results & her PAP to be mailed to her when they become available.

## 2012-03-19 NOTE — Patient Instructions (Addendum)
Preventive Care for Adults, Female A healthy lifestyle and preventive care can promote health and wellness. Preventive health guidelines for women include the following key practices.  A routine yearly physical is a good way to check with your caregiver about your health and preventive screening. It is a chance to share any concerns and updates on your health, and to receive a thorough exam.  Visit your dentist for a routine exam and preventive care every 6 months. Brush your teeth twice a day and floss once a day. Good oral hygiene prevents tooth decay and gum disease.  The frequency of eye exams is based on your age, health, family medical history, use of contact lenses, and other factors. Follow your caregiver's recommendations for frequency of eye exams.  Eat a healthy diet. Foods like vegetables, fruits, whole grains, low-fat dairy products, and lean protein foods contain the nutrients you need without too many calories. Decrease your intake of foods high in solid fats, added sugars, and salt. Eat the right amount of calories for you.Get information about a proper diet from your caregiver, if necessary.  Regular physical exercise is one of the most important things you can do for your health. Most adults should get at least 150 minutes of moderate-intensity exercise (any activity that increases your heart rate and causes you to sweat) each week. In addition, most adults need muscle-strengthening exercises on 2 or more days a week.  Maintain a healthy weight. The body mass index (BMI) is a screening tool to identify possible weight problems. It provides an estimate of body fat based on height and weight. Your caregiver can help determine your BMI, and can help you achieve or maintain a healthy weight.For adults 20 years and older:  A BMI below 18.5 is considered underweight.  A BMI of 18.5 to 24.9 is normal.  A BMI of 25 to 29.9 is considered overweight.  A BMI of 30 and above is  considered obese.  Maintain normal blood lipids and cholesterol levels by exercising and minimizing your intake of saturated fat. Eat a balanced diet with plenty of fruit and vegetables. Blood tests for lipids and cholesterol should begin at age 20 and be repeated every 5 years. If your lipid or cholesterol levels are high, you are over 50, or you are at high risk for heart disease, you may need your cholesterol levels checked more frequently.Ongoing high lipid and cholesterol levels should be treated with medicines if diet and exercise are not effective.  If you smoke, find out from your caregiver how to quit. If you do not use tobacco, do not start.  If you are pregnant, do not drink alcohol. If you are breastfeeding, be very cautious about drinking alcohol. If you are not pregnant and choose to drink alcohol, do not exceed 1 drink per day. One drink is considered to be 12 ounces (355 mL) of beer, 5 ounces (148 mL) of wine, or 1.5 ounces (44 mL) of liquor.  Avoid use of street drugs. Do not share needles with anyone. Ask for help if you need support or instructions about stopping the use of drugs.  High blood pressure causes heart disease and increases the risk of stroke. Your blood pressure should be checked at least every 1 to 2 years. Ongoing high blood pressure should be treated with medicines if weight loss and exercise are not effective.  If you are 55 to 77 years old, ask your caregiver if you should take aspirin to prevent strokes.  Diabetes   screening involves taking a blood sample to check your fasting blood sugar level. This should be done once every 3 years, after age 45, if you are within normal weight and without risk factors for diabetes. Testing should be considered at a younger age or be carried out more frequently if you are overweight and have at least 1 risk factor for diabetes.  Breast cancer screening is essential preventive care for women. You should practice "breast  self-awareness." This means understanding the normal appearance and feel of your breasts and may include breast self-examination. Any changes detected, no matter how small, should be reported to a caregiver. Women in their 20s and 30s should have a clinical breast exam (CBE) by a caregiver as part of a regular health exam every 1 to 3 years. After age 40, women should have a CBE every year. Starting at age 40, women should consider having a mammography (breast X-ray test) every year. Women who have a family history of breast cancer should talk to their caregiver about genetic screening. Women at a high risk of breast cancer should talk to their caregivers about having magnetic resonance imaging (MRI) and a mammography every year.  The Pap test is a screening test for cervical cancer. A Pap test can show cell changes on the cervix that might become cervical cancer if left untreated. A Pap test is a procedure in which cells are obtained and examined from the lower end of the uterus (cervix).  Women should have a Pap test starting at age 21.  Between ages 21 and 29, Pap tests should be repeated every 2 years.  Beginning at age 30, you should have a Pap test every 3 years as long as the past 3 Pap tests have been normal.  Some women have medical problems that increase the chance of getting cervical cancer. Talk to your caregiver about these problems. It is especially important to talk to your caregiver if a new problem develops soon after your last Pap test. In these cases, your caregiver may recommend more frequent screening and Pap tests.  The above recommendations are the same for women who have or have not gotten the vaccine for human papillomavirus (HPV).  If you had a hysterectomy for a problem that was not cancer or a condition that could lead to cancer, then you no longer need Pap tests. Even if you no longer need a Pap test, a regular exam is a good idea to make sure no other problems are  starting.  If you are between ages 65 and 70, and you have had normal Pap tests going back 10 years, you no longer need Pap tests. Even if you no longer need a Pap test, a regular exam is a good idea to make sure no other problems are starting.  If you have had past treatment for cervical cancer or a condition that could lead to cancer, you need Pap tests and screening for cancer for at least 20 years after your treatment.  If Pap tests have been discontinued, risk factors (such as a new sexual partner) need to be reassessed to determine if screening should be resumed.  The HPV test is an additional test that may be used for cervical cancer screening. The HPV test looks for the virus that can cause the cell changes on the cervix. The cells collected during the Pap test can be tested for HPV. The HPV test could be used to screen women aged 30 years and older, and should   be used in women of any age who have unclear Pap test results. After the age of 30, women should have HPV testing at the same frequency as a Pap test.  Colorectal cancer can be detected and often prevented. Most routine colorectal cancer screening begins at the age of 50 and continues through age 75. However, your caregiver may recommend screening at an earlier age if you have risk factors for colon cancer. On a yearly basis, your caregiver may provide home test kits to check for hidden blood in the stool. Use of a small camera at the end of a tube, to directly examine the colon (sigmoidoscopy or colonoscopy), can detect the earliest forms of colorectal cancer. Talk to your caregiver about this at age 50, when routine screening begins. Direct examination of the colon should be repeated every 5 to 10 years through age 75, unless early forms of pre-cancerous polyps or small growths are found.  Hepatitis C blood testing is recommended for all people born from 1945 through 1965 and any individual with known risks for hepatitis C.  Practice  safe sex. Use condoms and avoid high-risk sexual practices to reduce the spread of sexually transmitted infections (STIs). STIs include gonorrhea, chlamydia, syphilis, trichomonas, herpes, HPV, and human immunodeficiency virus (HIV). Herpes, HIV, and HPV are viral illnesses that have no cure. They can result in disability, cancer, and death. Sexually active women aged 25 and younger should be checked for chlamydia. Older women with new or multiple partners should also be tested for chlamydia. Testing for other STIs is recommended if you are sexually active and at increased risk.  Osteoporosis is a disease in which the bones lose minerals and strength with aging. This can result in serious bone fractures. The risk of osteoporosis can be identified using a bone density scan. Women ages 65 and over and women at risk for fractures or osteoporosis should discuss screening with their caregivers. Ask your caregiver whether you should take a calcium supplement or vitamin D to reduce the rate of osteoporosis.  Menopause can be associated with physical symptoms and risks. Hormone replacement therapy is available to decrease symptoms and risks. You should talk to your caregiver about whether hormone replacement therapy is right for you.  Use sunscreen with sun protection factor (SPF) of 30 or more. Apply sunscreen liberally and repeatedly throughout the day. You should seek shade when your shadow is shorter than you. Protect yourself by wearing long sleeves, pants, a wide-brimmed hat, and sunglasses year round, whenever you are outdoors.  Once a month, do a whole body skin exam, using a mirror to look at the skin on your back. Notify your caregiver of new moles, moles that have irregular borders, moles that are larger than a pencil eraser, or moles that have changed in shape or color.  Stay current with required immunizations.  Influenza. You need a dose every fall (or winter). The composition of the flu vaccine  changes each year, so being vaccinated once is not enough.  Pneumococcal polysaccharide. You need 1 to 2 doses if you smoke cigarettes or if you have certain chronic medical conditions. You need 1 dose at age 65 (or older) if you have never been vaccinated.  Tetanus, diphtheria, pertussis (Tdap, Td). Get 1 dose of Tdap vaccine if you are younger than age 65, are over 65 and have contact with an infant, are a healthcare worker, are pregnant, or simply want to be protected from whooping cough. After that, you need a Td   booster dose every 10 years. Consult your caregiver if you have not had at least 3 tetanus and diphtheria-containing shots sometime in your life or have a deep or dirty wound.  HPV. You need this vaccine if you are a woman age 26 or younger. The vaccine is given in 3 doses over 6 months.  Measles, mumps, rubella (MMR). You need at least 1 dose of MMR if you were born in 1957 or later. You may also need a second dose.  Meningococcal. If you are age 19 to 21 and a first-year college student living in a residence hall, or have one of several medical conditions, you need to get vaccinated against meningococcal disease. You may also need additional booster doses.  Zoster (shingles). If you are age 60 or older, you should get this vaccine.  Varicella (chickenpox). If you have never had chickenpox or you were vaccinated but received only 1 dose, talk to your caregiver to find out if you need this vaccine.  Hepatitis A. You need this vaccine if you have a specific risk factor for hepatitis A virus infection or you simply wish to be protected from this disease. The vaccine is usually given as 2 doses, 6 to 18 months apart.  Hepatitis B. You need this vaccine if you have a specific risk factor for hepatitis B virus infection or you simply wish to be protected from this disease. The vaccine is given in 3 doses, usually over 6 months. Preventive Services / Frequency Ages 19 to 39  Blood  pressure check.** / Every 1 to 2 years.  Lipid and cholesterol check.** / Every 5 years beginning at age 20.  Clinical breast exam.** / Every 3 years for women in their 20s and 30s.  Pap test.** / Every 2 years from ages 21 through 29. Every 3 years starting at age 30 through age 65 or 70 with a history of 3 consecutive normal Pap tests.  HPV screening.** / Every 3 years from ages 30 through ages 65 to 70 with a history of 3 consecutive normal Pap tests.  Hepatitis C blood test.** / For any individual with known risks for hepatitis C.  Skin self-exam. / Monthly.  Influenza immunization.** / Every year.  Pneumococcal polysaccharide immunization.** / 1 to 2 doses if you smoke cigarettes or if you have certain chronic medical conditions.  Tetanus, diphtheria, pertussis (Tdap, Td) immunization. / A one-time dose of Tdap vaccine. After that, you need a Td booster dose every 10 years.  HPV immunization. / 3 doses over 6 months, if you are 26 and younger.  Measles, mumps, rubella (MMR) immunization. / You need at least 1 dose of MMR if you were born in 1957 or later. You may also need a second dose.  Meningococcal immunization. / 1 dose if you are age 19 to 21 and a first-year college student living in a residence hall, or have one of several medical conditions, you need to get vaccinated against meningococcal disease. You may also need additional booster doses.  Varicella immunization.** / Consult your caregiver.  Hepatitis A immunization.** / Consult your caregiver. 2 doses, 6 to 18 months apart.  Hepatitis B immunization.** / Consult your caregiver. 3 doses usually over 6 months. Ages 40 to 64  Blood pressure check.** / Every 1 to 2 years.  Lipid and cholesterol check.** / Every 5 years beginning at age 20.  Clinical breast exam.** / Every year after age 40.  Mammogram.** / Every year beginning at age 40   and continuing for as long as you are in good health. Consult with your  caregiver.  Pap test.** / Every 3 years starting at age 30 through age 65 or 70 with a history of 3 consecutive normal Pap tests.  HPV screening.** / Every 3 years from ages 30 through ages 65 to 70 with a history of 3 consecutive normal Pap tests.  Fecal occult blood test (FOBT) of stool. / Every year beginning at age 50 and continuing until age 75. You may not need to do this test if you get a colonoscopy every 10 years.  Flexible sigmoidoscopy or colonoscopy.** / Every 5 years for a flexible sigmoidoscopy or every 10 years for a colonoscopy beginning at age 50 and continuing until age 75.  Hepatitis C blood test.** / For all people born from 1945 through 1965 and any individual with known risks for hepatitis C.  Skin self-exam. / Monthly.  Influenza immunization.** / Every year.  Pneumococcal polysaccharide immunization.** / 1 to 2 doses if you smoke cigarettes or if you have certain chronic medical conditions.  Tetanus, diphtheria, pertussis (Tdap, Td) immunization.** / A one-time dose of Tdap vaccine. After that, you need a Td booster dose every 10 years.  Measles, mumps, rubella (MMR) immunization. / You need at least 1 dose of MMR if you were born in 1957 or later. You may also need a second dose.  Varicella immunization.** / Consult your caregiver.  Meningococcal immunization.** / Consult your caregiver.  Hepatitis A immunization.** / Consult your caregiver. 2 doses, 6 to 18 months apart.  Hepatitis B immunization.** / Consult your caregiver. 3 doses, usually over 6 months. Ages 65 and over  Blood pressure check.** / Every 1 to 2 years.  Lipid and cholesterol check.** / Every 5 years beginning at age 20.  Clinical breast exam.** / Every year after age 40.  Mammogram.** / Every year beginning at age 40 and continuing for as long as you are in good health. Consult with your caregiver.  Pap test.** / Every 3 years starting at age 30 through age 65 or 70 with a 3  consecutive normal Pap tests. Testing can be stopped between 65 and 70 with 3 consecutive normal Pap tests and no abnormal Pap or HPV tests in the past 10 years.  HPV screening.** / Every 3 years from ages 30 through ages 65 or 70 with a history of 3 consecutive normal Pap tests. Testing can be stopped between 65 and 70 with 3 consecutive normal Pap tests and no abnormal Pap or HPV tests in the past 10 years.  Fecal occult blood test (FOBT) of stool. / Every year beginning at age 50 and continuing until age 75. You may not need to do this test if you get a colonoscopy every 10 years.  Flexible sigmoidoscopy or colonoscopy.** / Every 5 years for a flexible sigmoidoscopy or every 10 years for a colonoscopy beginning at age 50 and continuing until age 75.  Hepatitis C blood test.** / For all people born from 1945 through 1965 and any individual with known risks for hepatitis C.  Osteoporosis screening.** / A one-time screening for women ages 65 and over and women at risk for fractures or osteoporosis.  Skin self-exam. / Monthly.  Influenza immunization.** / Every year.  Pneumococcal polysaccharide immunization.** / 1 dose at age 65 (or older) if you have never been vaccinated.  Tetanus, diphtheria, pertussis (Tdap, Td) immunization. / A one-time dose of Tdap vaccine if you are over   65 and have contact with an infant, are a healthcare worker, or simply want to be protected from whooping cough. After that, you need a Td booster dose every 10 years.  Varicella immunization.** / Consult your caregiver.  Meningococcal immunization.** / Consult your caregiver.  Hepatitis A immunization.** / Consult your caregiver. 2 doses, 6 to 18 months apart.  Hepatitis B immunization.** / Check with your caregiver. 3 doses, usually over 6 months. ** Family history and personal history of risk and conditions may change your caregiver's recommendations. Document Released: 04/16/2001 Document Revised: 05/13/2011  Document Reviewed: 07/16/2010 [employer] Patient Information 2013 [employer]  

## 2012-03-19 NOTE — Assessment & Plan Note (Signed)
Well controlled, no changes 

## 2012-03-19 NOTE — Progress Notes (Signed)
Patient ID: Taylor Vaughn, female   DOB: 06/15/1929, 79 y.o.   MRN: [id number] Taylor Vaughn [id number] 11/13/1929 03/19/2012      Progress Note New Patient  Subjective  Chief Complaint  Chief Complaint  Patient presents with  . Follow-up    6 week    HPI  Patient is an 79 year old Caucasian female who is in today for GYN exam. Overall her she is doing fairly well. Continues to struggle with grief over the recent death of her husband but on most cirrhosis well. Needs very little diazepam. Has no GYN complaints. No recent illness, fevers, headaches, chest pain, palpitations, shortness of breath, GI or GU complaints otherwise.  Past Medical History  Diagnosis Date  . Chicken pox as a child  . Measles 17  . Mumps as a child  . Whooping cough 7 yrs old  . Hypertension   . Hyperlipidemia   . Insomnia   . Colon polyps     adenomatous, recurrrent, due  again 12/13  . Urinary frequency 07/22/2011  . Acute chest wall pain 07/22/2011  . Preventative health care 07/22/2011  . Right shoulder strain 10/03/2011  . MRSA (methicillin resistant staph aureus) culture positive 10/03/2011  . Grief reaction 10/03/2011  . Candidal skin infection 10/03/2011  . Allergic rhinitis 11/22/2011  . Renal artery stenosis 11/24/2011  . Cervical cancer screening 03/19/2012    Past Surgical History  Procedure Date  . Cholecystectomy 1992  . Skin biopsy     multiple, all benign    Family History  Problem Relation Age of Onset  . Cancer Mother 19    breast- remission, late 62's lung cancer  . Hypertension Mother     ?  Marland Kitchen Angina Father   . Diabetes Father     type 2  . Heart disease Father   . Obesity Sister   . Diabetes Sister     type 2  . Hyperlipidemia Sister   . Heart attack Sister   . Spina bifida Brother   . Constipation Daughter   . Hypertension Daughter   . Hyperlipidemia Daughter   . Obesity Son   . Hypertension Son   . Obesity Maternal Grandmother   . Heart attack Maternal  Grandmother   . Stroke Paternal Grandfather     History   Social History  . Marital Status: Widowed    Spouse Name: N/A    Number of Children: N/A  . Years of Education: N/A   Occupational History  . Not on file.   Social History Main Topics  . Smoking status: Former Smoker    Types: Cigarettes  . Smokeless tobacco: Never Used     Comment: smoked for 3 years in 30's  . Alcohol Use: No  . Drug Use: No  . Sexually Active: No   Other Topics Concern  . Not on file   Social History Narrative  . No narrative on file    Current Outpatient Prescriptions on File Prior to Visit  Medication Sig Dispense Refill  . Calcium Carbonate-Vit D-Min (CALCIUM 1200 PO) Take 1 tablet by mouth 2 (two) times daily.      Marland Kitchen co-enzyme Q-10 30 MG capsule Take 30 mg by mouth daily.      . diazepam (VALIUM) 5 MG tablet Take 1 tablet (5 mg total) by mouth 2 (two) times daily as needed for anxiety or sleep.  60 tablet  2  . fluticasone (FLONASE) 50 MCG/ACT nasal spray Place 2 sprays into the nose daily.  16 g  6  . GARLIC PO Take by mouth.      . loratadine (CLARITIN) 10 MG tablet Take 1 tablet (10 mg total) by mouth daily as needed for allergies.  30 tablet  0  . metoprolol succinate (TOPROL-XL) 25 MG 24 hr tablet Take 1 tablet (25 mg total) by mouth daily.  90 tablet  1  . Multiple Vitamin (MULTIVITAMIN) tablet Take 1 tablet by mouth daily.      . NON FORMULARY 2-3 cloves of raw garlic daily      . POTASSIUM CITRATE PO Take 90 mg by mouth.      . Probiotic Product (PHILLIPS COLON HEALTH PO) Take by mouth.      . Red Yeast Rice 600 MG CAPS Take by mouth.      . sodium chloride (AYR) 0.65 % nasal spray Place 2 sprays into the nose as needed for congestion (twice daily as needed).  30 mL  2  . Soya Lecithin 1200 MG CAPS Take 2,400 mg by mouth daily.      . vitamin E 400 UNIT capsule Take 400 Units by mouth daily.      . fish oil-omega-3 fatty acids 1000 MG capsule Take 2 g by mouth daily.      .  niacin 500 MG tablet Take 500 mg by mouth daily with breakfast.        Allergies  Allergen Reactions  . Lisinopril Cough  . Statins     Muscle cramps, rashes, loss of hair  . Keflex (Cephalexin) Rash  . Losartan     phlegm    Review of Systems  Review of Systems  Constitutional: Negative for fever, chills and malaise/fatigue.  HENT: Negative for hearing loss, nosebleeds and congestion.   Eyes: Negative for discharge.  Respiratory: Negative for cough, sputum production, shortness of breath and wheezing.   Cardiovascular: Negative for chest pain, palpitations and leg swelling.  Gastrointestinal: Negative for heartburn, nausea, vomiting, abdominal pain, diarrhea, constipation and blood in stool.  Genitourinary: Negative for dysuria, urgency, frequency and hematuria.  Musculoskeletal: Negative for myalgias, back pain and falls.  Skin: Negative for rash.  Neurological: Negative for dizziness, tremors, sensory change, focal weakness, loss of consciousness, weakness and headaches.  Endo/Heme/Allergies: Negative for polydipsia. Does not bruise/bleed easily.  Psychiatric/Behavioral: Negative for depression and suicidal ideas. The patient is not nervous/anxious and does not have insomnia.     Objective  BP 123/82  Pulse 68  Temp 97.6 F (36.4 C) (Temporal)  Ht 5' 3.25" (1.607 m)  Wt 171 lb 1.9 oz (77.62 kg)  BMI 30.07 kg/m2  SpO2 98%  Physical Exam  Physical Exam  Constitutional: She is oriented to person, place, and time and well-developed, well-nourished, and in no distress. No distress.  HENT:  Head: Normocephalic and atraumatic.  Right Ear: External ear normal.  Left Ear: External ear normal.  Nose: Nose normal.  Mouth/Throat: Oropharynx is clear and moist. No oropharyngeal exudate.  Eyes: Conjunctivae normal are normal. Pupils are equal, round, and reactive to light. Right eye exhibits no discharge. Left eye exhibits no discharge. No scleral icterus.  Neck: Normal range  of motion. Neck supple. No thyromegaly present.  Cardiovascular: Normal rate, regular rhythm, normal heart sounds and intact distal pulses.   No murmur heard. Pulmonary/Chest: Effort normal and breath sounds normal. No respiratory distress. She has no wheezes. She has no rales.  Abdominal: Soft. Bowel sounds are normal. She exhibits no distension and no mass. There is  no tenderness.  Genitourinary: Vagina normal, uterus normal, cervix normal, right adnexa normal and left adnexa normal. No vaginal discharge found.  Musculoskeletal: Normal range of motion. She exhibits no edema and no tenderness.  Lymphadenopathy:    She has no cervical adenopathy.  Neurological: She is alert and oriented to person, place, and time. She has normal reflexes. No cranial nerve deficit. Coordination normal.  Skin: Skin is warm and dry. No rash noted. She is not diaphoretic.  Psychiatric: Mood, memory and affect normal.       Assessment & Plan  Colon polyps Repeat colonoscopy will repeat in 2 years.  Cervical cancer screening Pap taken today  Hypertension Well controlled, no changes.  Hyperlipidemia Check lipid panel today, patient refuses statins

## 2012-03-19 NOTE — Assessment & Plan Note (Signed)
Pap taken today 

## 2012-03-19 NOTE — Assessment & Plan Note (Signed)
Check lipid panel today, patient refuses statins

## 2012-03-23 NOTE — Progress Notes (Signed)
Quick Note:  Patient Informed and voiced understanding ______ 

## 2012-04-08 ENCOUNTER — Other Ambulatory Visit: Payer: Medicare Other

## 2012-04-08 ENCOUNTER — Encounter: Payer: Medicare Other | Admitting: Family Medicine

## 2012-04-27 ENCOUNTER — Encounter: Payer: Self-pay | Admitting: Family Medicine

## 2012-05-06 ENCOUNTER — Telehealth: Payer: Self-pay | Admitting: *Deleted

## 2012-05-06 NOTE — Telephone Encounter (Signed)
Received call from pt stating she had an episode of lower right abdominal pain Friday night. Had sharp pains on Monday. Pain seems to have resolved the last 2 days but pt wants to know if this could be related to the narrowing of her right renal artery? She reports that she has had these similar episodes of pain since she was a child and has been tested several times for appendicitis and testing always seems to be normal. Pt denies diarrhea, constipation, n/v, difficulty urinating or painful urination and no fever. Please advise.

## 2012-05-06 NOTE — Telephone Encounter (Signed)
So this is not a vascular pain the way it has been described. It is likely due to her bowels catching on some scar tissue in her abdomen as they try to move. This can get very painful and then when the bowels move past the spot the pain resolves

## 2012-05-07 NOTE — Telephone Encounter (Signed)
Pt informed and voiced understanding

## 2012-05-18 NOTE — Telephone Encounter (Signed)
Can this phone note be closed? Looks like the PAP results have been mailed. Were the labs also sent?

## 2012-07-07 ENCOUNTER — Encounter: Payer: Self-pay | Admitting: Family Medicine

## 2012-07-07 ENCOUNTER — Ambulatory Visit (INDEPENDENT_AMBULATORY_CARE_PROVIDER_SITE_OTHER): Payer: Medicare Other | Admitting: Family Medicine

## 2012-07-07 VITALS — BP 118/82 | HR 68 | Temp 98.1°F | Ht 63.25 in | Wt 170.1 lb

## 2012-07-07 DIAGNOSIS — I10 Essential (primary) hypertension: Secondary | ICD-10-CM

## 2012-07-07 DIAGNOSIS — R5381 Other malaise: Secondary | ICD-10-CM

## 2012-07-07 DIAGNOSIS — I701 Atherosclerosis of renal artery: Secondary | ICD-10-CM

## 2012-07-07 DIAGNOSIS — B372 Candidiasis of skin and nail: Secondary | ICD-10-CM

## 2012-07-07 DIAGNOSIS — E785 Hyperlipidemia, unspecified: Secondary | ICD-10-CM

## 2012-07-07 DIAGNOSIS — Z22322 Carrier or suspected carrier of Methicillin resistant Staphylococcus aureus: Secondary | ICD-10-CM

## 2012-07-07 DIAGNOSIS — R350 Frequency of micturition: Secondary | ICD-10-CM

## 2012-07-07 DIAGNOSIS — R32 Unspecified urinary incontinence: Secondary | ICD-10-CM

## 2012-07-07 NOTE — Patient Instructions (Addendum)
For bowels, probiotics, benefiber, fluids (64 oz), pericolace or SennaS 1-2 daily  Next visit annual with labs prior lipid, renal, cbc, tsh, hepaticCholesterol Cholesterol is a white, waxy, fat-like protein needed by your body in small amounts. The liver makes all the cholesterol you need. It is carried from the liver by the blood through the blood vessels. Deposits (plaque) may build up on blood vessel walls. This makes the arteries narrower and stiffer. Plaque increases the risk for heart attack and stroke. You cannot feel your cholesterol level even if it is very high. The only way to know is by a blood test to check your lipid (fats) levels. Once you know your cholesterol levels, you should keep a record of the test results. Work with your caregiver to to keep your levels in the desired range. WHAT THE RESULTS MEAN:  Total cholesterol is a rough measure of all the cholesterol in your blood.  LDL is the so-called bad cholesterol. This is the type that deposits cholesterol in the walls of the arteries. You want this level to be low.  HDL is the good cholesterol because it cleans the arteries and carries the LDL away. You want this level to be high.  Triglycerides are fat that the body can either burn for energy or store. High levels are closely linked to heart disease. DESIRED LEVELS:  Total cholesterol below 200.  LDL below 100 for people at risk, below 70 for very high risk.  HDL above 50 is good, above 60 is best.  Triglycerides below 150. HOW TO LOWER YOUR CHOLESTEROL:  Diet.  Choose fish or white meat chicken and [place], roasted or baked. Limit fatty cuts of red meat, fried foods, and processed meats, such as sausage and lunch meat.  Eat lots of fresh fruits and vegetables. Choose whole grains, beans, pasta, potatoes and cereals.  Use only small amounts of olive, corn or canola oils. Avoid butter, mayonnaise, shortening or palm kernel oils. Avoid foods with trans-fats.  Use  skim/nonfat milk and low-fat/nonfat yogurt and cheeses. Avoid whole milk, cream, ice cream, egg yolks and cheeses. Healthy desserts include angel food cake, ginger snaps, animal crackers, hard candy, popsicles, and low-fat/nonfat frozen yogurt. Avoid pastries, cakes, pies and cookies.  Exercise.  A regular program helps decrease LDL and raises HDL.  Helps with weight control.  Do things that increase your activity level like gardening, walking, or taking the stairs.  Medication.  May be prescribed by your caregiver to help lowering cholesterol and the risk for heart disease.  You may need medicine even if your levels are normal if you have several risk factors. HOME CARE INSTRUCTIONS   Follow your diet and exercise programs as suggested by your caregiver.  Take medications as directed.  Have blood work done when your caregiver feels it is necessary. MAKE SURE YOU:   Understand these instructions.  Will watch your condition.  Will get help right away if you are not doing well or get worse. Document Released: 11/13/2000 Document Revised: 05/13/2011 Document Reviewed: 05/06/2007 [facility] Patient Information 2013 [place], [place].

## 2012-07-07 NOTE — Progress Notes (Signed)
Patient ID: Taylor Vaughn, female   DOB: 01/11/1930, 79 y.o.   MRN: [id number] Karita Dralle [id number] [birth year] 07/07/2012      Progress Note-Follow Up  Subjective  Chief Complaint  Chief Complaint  Patient presents with  . Follow-up    5 month    HPI  Patient is an 79 year old Caucasian female who is here today in followup. She had to return to dermatology for a rash under her breasts but is now improving. There is no itching or discomfort. No swelling or warmth. She's had no follicular lesions but does have mupirocin ointment if she needs it. Has occasional right lower quadrant pain but this is fleeting and not. She complains of a sensation of feeling lightheaded when she stands up too quickly but denies any other neurologic complaints such as vision or hearing changes. He denies chest pain or palpitations. No shortness or breath GI complaints. Does note some nocturia getting up several times a night to urinate. Denies dysuria.  Past Medical History  Diagnosis Date  . Chicken pox as a child  . Measles 17  . Mumps as a child  . Whooping cough 7 yrs old  . Hypertension   . Hyperlipidemia   . Insomnia   . Colon polyps     adenomatous, recurrrent, due  again 12/13  . Urinary frequency 07/22/2011  . Acute chest wall pain 07/22/2011  . Preventative health care 07/22/2011  . Right shoulder strain 10/03/2011  . MRSA (methicillin resistant staph aureus) culture positive 10/03/2011  . Grief reaction 10/03/2011  . Candidal skin infection 10/03/2011  . Allergic rhinitis 11/22/2011  . Renal artery stenosis 11/24/2011  . Cervical cancer screening 03/19/2012    Past Surgical History  Procedure Laterality Date  . Cholecystectomy  1992  . Skin biopsy      multiple, all benign    Family History  Problem Relation Age of Onset  . Cancer Mother 44    breast- remission, late 67's lung cancer  . Hypertension Mother     ?  Marland Kitchen Angina Father   . Diabetes Father     type 2  . Heart disease  Father   . Obesity Sister   . Diabetes Sister     type 2  . Hyperlipidemia Sister   . Heart attack Sister   . Spina bifida Brother   . Constipation Daughter   . Hypertension Daughter   . Hyperlipidemia Daughter   . Obesity Son   . Hypertension Son   . Obesity Maternal Grandmother   . Heart attack Maternal Grandmother   . Stroke Paternal Grandfather     History   Social History  . Marital Status: Widowed    Spouse Name: N/A    Number of Children: N/A  . Years of Education: N/A   Occupational History  . Not on file.   Social History Main Topics  . Smoking status: Former Smoker    Types: Cigarettes  . Smokeless tobacco: Never Used     Comment: smoked for 3 years in 30's  . Alcohol Use: No  . Drug Use: No  . Sexually Active: No   Other Topics Concern  . Not on file   Social History Narrative  . No narrative on file    Current Outpatient Prescriptions on File Prior to Visit  Medication Sig Dispense Refill  . Calcium Carbonate-Vit D-Min (CALCIUM 1200 PO) Take 1 tablet by mouth 2 (two) times daily.      Marland Kitchen co-enzyme Q-10 30  MG capsule Take 30 mg by mouth daily.      . diazepam (VALIUM) 5 MG tablet Take 1 tablet (5 mg total) by mouth 2 (two) times daily as needed for anxiety or sleep.  60 tablet  2  . fish oil-omega-3 fatty acids 1000 MG capsule Take 2 g by mouth daily.      Marland Kitchen GARLIC PO Take by mouth.      . metoprolol succinate (TOPROL-XL) 25 MG 24 hr tablet Take 1 tablet (25 mg total) by mouth daily.  90 tablet  1  . Multiple Vitamin (MULTIVITAMIN) tablet Take 1 tablet by mouth daily.      . niacin 500 MG tablet Take 500 mg by mouth daily with breakfast.      . NON FORMULARY 2-3 cloves of raw garlic daily      . Probiotic Product (PHILLIPS COLON HEALTH PO) Take by mouth.      . sodium chloride (AYR) 0.65 % nasal spray Place 2 sprays into the nose as needed for congestion (twice daily as needed).  30 mL  2  . Soya Lecithin 1200 MG CAPS Take 2,400 mg by mouth daily.       . vitamin E 400 UNIT capsule Take 400 Units by mouth daily.       No current facility-administered medications on file prior to visit.    Allergies  Allergen Reactions  . Lisinopril Cough  . Statins     Muscle cramps, rashes, loss of hair  . Keflex (Cephalexin) Rash  . Losartan     phlegm    Review of Systems  Review of Systems  Constitutional: Negative for fever and malaise/fatigue.  HENT: Negative for congestion.   Eyes: Negative for discharge.  Respiratory: Negative for shortness of breath.   Cardiovascular: Negative for chest pain, palpitations and leg swelling.  Gastrointestinal: Negative for nausea, abdominal pain and diarrhea.  Genitourinary: Negative for dysuria.  Musculoskeletal: Negative for falls.  Skin: Negative for rash.  Neurological: Positive for dizziness. Negative for loss of consciousness and headaches.       Light headed upon arising but resolves after standing  Endo/Heme/Allergies: Negative for polydipsia.  Psychiatric/Behavioral: Negative for depression and suicidal ideas. The patient is not nervous/anxious and does not have insomnia.     Objective  BP 118/82  Pulse 68  Temp(Src) 98.1 F (36.7 C) (Oral)  Ht 5' 3.25" (1.607 m)  Wt 170 lb 1.9 oz (77.166 kg)  BMI 29.88 kg/m2  SpO2 95%  Physical Exam  Physical Exam  Constitutional: She is oriented to person, place, and time and well-developed, well-nourished, and in no distress. No distress.  HENT:  Head: Normocephalic and atraumatic.  Eyes: Conjunctivae are normal.  Neck: Neck supple. No thyromegaly present.  Cardiovascular: Normal rate and regular rhythm.  Exam reveals no gallop.   Pulmonary/Chest: Effort normal and breath sounds normal. She has no wheezes.  Abdominal: She exhibits no distension and no mass.  Musculoskeletal: She exhibits no edema.  Lymphadenopathy:    She has no cervical adenopathy.  Neurological: She is alert and oriented to person, place, and time.  Skin: Skin is  warm and dry. No rash noted. She is not diaphoretic.  Psychiatric: Memory, affect and judgment normal.    Lab Results  Component Value Date   TSH 0.98 03/19/2012   Lab Results  Component Value Date   WBC 6.8 03/19/2012   HGB 14.6 03/19/2012   HCT 43.5 03/19/2012   MCV 94.7 03/19/2012  PLT 327.0 03/19/2012   Lab Results  Component Value Date   CREATININE 0.9 03/19/2012   BUN 21 03/19/2012   NA 138 03/19/2012   K 3.7 03/19/2012   CL 103 03/19/2012   CO2 30 03/19/2012   Lab Results  Component Value Date   ALT 22 03/19/2012   AST 20 03/19/2012   ALKPHOS 47 03/19/2012   BILITOT 0.9 03/19/2012   Lab Results  Component Value Date   CHOL 257* 03/19/2012   Lab Results  Component Value Date   HDL 46.30 03/19/2012   No results found for this basename: [facility]   Lab Results  Component Value Date   TRIG 138.0 03/19/2012   Lab Results  Component Value Date   CHOLHDL 6 03/19/2012     Assessment & Plan  Candidal skin infection Skin actually looks good today, only some mild permanent discoloration from previous infections.   MRSA (methicillin resistant staph aureus) culture positive Mupirocin ointment prn for any lesions  Hyperlipidemia Is tolerating Niacin 1000 mg qhs, consider krill oil and red yeast rice and avoid trans fats.  Hypertension Well controlled no changes.  Renal artery stenosis Patient is hesitant to return for repeat renal ultrasound

## 2012-07-08 MED ORDER — NIACIN 500 MG PO TABS: 1000.0000 mg | ORAL_TABLET | Freq: Every day | ORAL | Status: DC

## 2012-07-08 NOTE — Assessment & Plan Note (Signed)
Mupirocin ointment prn for any lesions

## 2012-07-08 NOTE — Assessment & Plan Note (Signed)
Patient is hesitant to return for repeat renal ultrasound

## 2012-07-08 NOTE — Assessment & Plan Note (Signed)
Well controlled no changes 

## 2012-07-08 NOTE — Assessment & Plan Note (Signed)
Is tolerating Niacin 1000 mg qhs, consider krill oil and red yeast rice and avoid trans fats.

## 2012-07-08 NOTE — Assessment & Plan Note (Signed)
Skin actually looks good today, only some mild permanent discoloration from previous infections.

## 2012-07-29 ENCOUNTER — Ambulatory Visit (INDEPENDENT_AMBULATORY_CARE_PROVIDER_SITE_OTHER): Payer: Medicare Other | Admitting: Nurse Practitioner

## 2012-07-29 ENCOUNTER — Encounter: Payer: Self-pay | Admitting: Nurse Practitioner

## 2012-07-29 VITALS — BP 120/80 | HR 67 | Temp 97.6°F | Ht 63.25 in | Wt 170.8 lb

## 2012-07-29 DIAGNOSIS — L538 Other specified erythematous conditions: Secondary | ICD-10-CM

## 2012-07-29 DIAGNOSIS — H8110 Benign paroxysmal vertigo, unspecified ear: Secondary | ICD-10-CM

## 2012-07-29 DIAGNOSIS — L304 Erythema intertrigo: Secondary | ICD-10-CM

## 2012-07-29 MED ORDER — CLOTRIMAZOLE-BETAMETHASONE 1-0.05 % EX CREA: TOPICAL_CREAM | Freq: Two times a day (BID) | CUTANEOUS | Status: DC

## 2012-07-29 NOTE — Progress Notes (Signed)
  Subjective:    Patient ID: Taylor Vaughn, female    DOB: November 15, 1929, 79 y.o.   MRN: [id number]  HPI Comments: Also presents w/c/o dizziness. Started this am. Feels like will fall if does not hold onto wall. Had headache few days ago relieved by 1 tab ES tylenol. No other associated symptoms.  Rash This is a chronic problem. The current episode started 1 to 4 weeks ago. The problem is unchanged. The affected locations include the torso (under R breast). The rash is characterized by itchiness and redness. She was exposed to nothing. Pertinent negatives include no fatigue or fever. Past treatments include nothing.      Review of Systems  Constitutional: Negative for fever, chills, activity change, appetite change and fatigue.  HENT: Positive for postnasal drip (occurs at night). Negative for hearing loss, ear pain and tinnitus.   Eyes: Negative for itching.  Genitourinary: Positive for frequency (urinates every 1-2 hours at night).  Skin: Positive for rash.  Neurological: Positive for dizziness (worse with position changes), weakness (felt weak several days ago, better now) and headaches (1 episode 2 days ago). Negative for tremors, seizures, syncope, speech difficulty, light-headedness and numbness.       Objective:   Physical Exam  Constitutional: She is oriented to person, place, and time. She appears well-developed and well-nourished. No distress.  HENT:  Head: Normocephalic and atraumatic.  Right Ear: Hearing, tympanic membrane, external ear and ear canal normal.  Left Ear: Hearing, tympanic membrane, external ear and ear canal normal.  Mouth/Throat: Oropharynx is clear and moist.  Eyes: Conjunctivae are normal. Pupils are equal, round, and reactive to light.  Rapid eye movements noted with Dix-Hallpike maneuver  Neck: Normal range of motion. Neck supple. No JVD present. No tracheal deviation present. No thyromegaly present.  Cardiovascular: Normal rate.   Pulmonary/Chest:  Effort normal.  Lymphadenopathy:    She has no cervical adenopathy.  Neurological: She is alert and oriented to person, place, and time. No cranial nerve deficit. Coordination normal.  Pos romberg, CN grossly intact, grabs onto wall or table with position changes. Able to walk independently without stumbling.  Skin: Skin is warm and dry. Rash (red raised patchy lesion w/ satellite papules under R breast) noted.  Psychiatric: She has a normal mood and affect. Her behavior is normal. Thought content normal.          Assessment & Plan:  1. Benign paroxysmal positional vertigo Start epley's maneuver- about 1 hour daily.  2. Intertrigo - clotrimazole-betamethasone (LOTRISONE) cream; Apply topically 2 (two) times daily.  Dispense: 30 g; Refill: 0 See pt instructions.

## 2012-07-29 NOTE — Patient Instructions (Addendum)
It seems your symptoms are consistent with benign paroxysmal positional vertigo. Start epley's maneuver as you have done in the past. Please be careful with position changes. Wear life alert while at home. For rash: Use clotrimazole cream twice daily. Wash with vinegar & water (1Tbsp vinegar : 1 qt water). Keep area dry. Wear light colored cotton fabrics, no bra when you can.  Pleasure to meet you!  Intertrigo Intertrigo is a skin condition that occurs in between folds of skin in places on the body that rub together a lot and do not get much ventilation. It is caused by heat, moisture, friction, sweat retention, and lack of air circulation, which produces red, irritated patches and, sometimes, scaling or drainage. People who have diabetes, who are obese, or who have treatment with antibiotics are at increased risk for intertrigo. The most common sites for intertrigo to occur include:  The groin.  The breasts.  The armpits.  Folds of abdominal skin.  Webbed spaces between the fingers or toes. Intertrigo may be aggravated by:  Sweat.  Feces.  Yeast or bacteria that are present near skin folds.  Urine.  Vaginal discharge. HOME CARE INSTRUCTIONS  The following steps can be taken to reduce friction and keep the affected area cool and dry:  Expose skin folds to the air.  Keep deep skin folds separated with cotton or linen cloth. Avoid tight fitting clothing that could cause chafing.  Wear open-toed shoes or sandals to help reduce moisture between the toes.  Apply absorbent powders to affected areas as directed by your caregiver.  Apply over-the-counter barrier pastes, such as zinc oxide, as directed by your caregiver.  If you develop a fungal infection in the affected area, your caregiver may have you use antifungal creams. SEEK MEDICAL CARE IF:   The rash is not improving after 1 week of treatment.  The rash is getting worse (more red, more swollen, more painful, or  spreading).  You have a fever or chills. MAKE SURE YOU:   Understand these instructions.  Will watch your condition.  Will get help right away if you are not doing well or get worse. Document Released: 02/18/2005 Document Revised: 05/13/2011 Document Reviewed: 08/03/2009 [facility] Patient Information 2014 [place], [place].  Benign Positional Vertigo Vertigo means you feel like you or your surroundings are moving when they are not. Benign positional vertigo is the most common form of vertigo. Benign means that the cause of your condition is not serious. Benign positional vertigo is more common in older adults. CAUSES  Benign positional vertigo is the result of an upset in the labyrinth system. This is an area in the middle ear that helps control your balance. This may be caused by a viral infection, head injury, or repetitive motion. However, often no specific cause is found. SYMPTOMS  Symptoms of benign positional vertigo occur when you move your head or eyes in different directions. Some of the symptoms may include:  Loss of balance and falls.  Vomiting.  Blurred vision.  Dizziness.  Nausea.  Involuntary eye movements (nystagmus). DIAGNOSIS  Benign positional vertigo is usually diagnosed by physical exam. If the specific cause of your benign positional vertigo is unknown, your caregiver may perform imaging tests, such as magnetic resonance imaging (MRI) or computed tomography (CT). TREATMENT  Your caregiver may recommend movements or procedures to correct the benign positional vertigo. Medicines such as meclizine, benzodiazepines, and medicines for nausea may be used to treat your symptoms. In rare cases, if your symptoms are  caused by certain conditions that affect the inner ear, you may need surgery. HOME CARE INSTRUCTIONS   Follow your caregiver's instructions.  Move slowly. Do not make sudden body or head movements.  Avoid driving.  Avoid operating heavy  machinery.  Avoid performing any tasks that would be dangerous to you or others during a vertigo episode.  Drink enough fluids to keep your urine clear or pale yellow. SEEK IMMEDIATE MEDICAL CARE IF:   You develop problems with walking, weakness, numbness, or using your arms, hands, or legs.  You have difficulty speaking.  You develop severe headaches.  Your nausea or vomiting continues or gets worse.  You develop visual changes.  Your family or friends notice any behavioral changes.  Your condition gets worse.  You have a fever.  You develop a stiff neck or sensitivity to light. MAKE SURE YOU:   Understand these instructions.  Will watch your condition.  Will get help right away if you are not doing well or get worse. Document Released: 11/26/2005 Document Revised: 05/13/2011 Document Reviewed: 11/08/2010 [facility] Patient Information 2014 [place], [place].

## 2012-07-30 ENCOUNTER — Telehealth: Payer: Self-pay | Admitting: Nurse Practitioner

## 2012-07-30 NOTE — Telephone Encounter (Signed)
Patient would like to know how many times a day she should be doing her "exercise for vertigo". Patient asked that we leave a detailed VM if she doesn't answer.

## 2012-07-30 NOTE — Telephone Encounter (Signed)
please tell patient she may do exercises once or twice daily. She should remain upright for several hours. She should not bend forward. She should change sleep positions-not sleep on same side each night.

## 2012-07-30 NOTE — Telephone Encounter (Signed)
Please advise 

## 2012-07-30 NOTE — Telephone Encounter (Signed)
Patient informed, understood & agreed/SLS  

## 2012-10-06 ENCOUNTER — Other Ambulatory Visit: Payer: Self-pay

## 2012-10-06 DIAGNOSIS — G4700 Insomnia, unspecified: Secondary | ICD-10-CM

## 2012-10-06 DIAGNOSIS — I10 Essential (primary) hypertension: Secondary | ICD-10-CM

## 2012-10-06 DIAGNOSIS — F419 Anxiety disorder, unspecified: Secondary | ICD-10-CM

## 2012-10-06 NOTE — Telephone Encounter (Signed)
Patient left a message stating that she would like a 90 day supply of her Metoprolol 25 mg and a quantity of 60 of her Diazepam mailed to patient.  RX's can't be mailed- i will inform patient of this  Please advise Diazepam refill? Last RX was done on 11-22-11 quantity 60 with 2 refills

## 2012-10-06 NOTE — Telephone Encounter (Signed)
OK to 90 days with 1 rf on the Metoprolol and Diazepam #60 with 1 refill

## 2012-10-07 ENCOUNTER — Other Ambulatory Visit: Payer: Self-pay

## 2012-10-07 MED ORDER — DIAZEPAM 5 MG PO TABS: 5.0000 mg | ORAL_TABLET | Freq: Two times a day (BID) | ORAL | Status: DC | PRN

## 2012-10-07 MED ORDER — METOPROLOL SUCCINATE ER 25 MG PO TB24: 25.0000 mg | ORAL_TABLET | Freq: Every day | ORAL | Status: DC

## 2012-10-07 NOTE — Telephone Encounter (Signed)
Left a message for patient to return my call to let me know if pt wants these printed or sent to pharmacy

## 2012-11-09 ENCOUNTER — Ambulatory Visit: Payer: Medicare Other | Admitting: Physician Assistant

## 2012-11-11 ENCOUNTER — Ambulatory Visit (INDEPENDENT_AMBULATORY_CARE_PROVIDER_SITE_OTHER): Payer: Medicare Other | Admitting: Family Medicine

## 2012-11-11 ENCOUNTER — Encounter: Payer: Self-pay | Admitting: Family Medicine

## 2012-11-11 ENCOUNTER — Telehealth: Payer: Self-pay | Admitting: Family Medicine

## 2012-11-11 VITALS — BP 120/78 | HR 70 | Temp 98.3°F | Ht 63.25 in | Wt 167.1 lb

## 2012-11-11 DIAGNOSIS — L309 Dermatitis, unspecified: Secondary | ICD-10-CM

## 2012-11-11 DIAGNOSIS — L259 Unspecified contact dermatitis, unspecified cause: Secondary | ICD-10-CM

## 2012-11-11 DIAGNOSIS — E785 Hyperlipidemia, unspecified: Secondary | ICD-10-CM

## 2012-11-11 DIAGNOSIS — I10 Essential (primary) hypertension: Secondary | ICD-10-CM

## 2012-11-11 DIAGNOSIS — B354 Tinea corporis: Secondary | ICD-10-CM

## 2012-11-11 LAB — RENAL FUNCTION PANEL
BUN: 21 mg/dL (ref 6–23)
CO2: 31 mEq/L (ref 19–32)
Calcium: 10.5 mg/dL (ref 8.4–10.5)
Chloride: 104 mEq/L (ref 96–112)
Glucose, Bld: 96 mg/dL (ref 70–99)
Potassium: 4.9 mEq/L (ref 3.5–5.3)

## 2012-11-11 LAB — CBC
HCT: 41.5 % (ref 36.0–46.0)
MCH: 32.5 pg (ref 26.0–34.0)
MCHC: 34.5 g/dL (ref 30.0–36.0)
MCV: 94.3 fL (ref 78.0–100.0)
Platelets: 318 10*3/uL (ref 150–400)
RDW: 13.4 % (ref 11.5–15.5)

## 2012-11-11 LAB — HEPATIC FUNCTION PANEL
ALT: 13 U/L (ref 0–35)
AST: 16 U/L (ref 0–37)
Alkaline Phosphatase: 47 U/L (ref 39–117)
Indirect Bilirubin: 0.5 mg/dL (ref 0.0–0.9)
Total Protein: 6.4 g/dL (ref 6.0–8.3)

## 2012-11-11 LAB — LIPID PANEL: LDL Cholesterol: 196 mg/dL — ABNORMAL HIGH (ref 0–99)

## 2012-11-11 MED ORDER — CLOBETASOL PROPIONATE 0.05 % EX CREA: TOPICAL_CREAM | Freq: Two times a day (BID) | CUTANEOUS | Status: DC

## 2012-11-11 MED ORDER — ITRACONAZOLE 100 MG PO CAPS: 200.0000 mg | ORAL_CAPSULE | Freq: Every day | ORAL | Status: DC

## 2012-11-11 NOTE — Telephone Encounter (Signed)
She checked the price of the itraconazole $288 at [facility] and $158 at [facility].  She needs an rx for something different as this is too expensive

## 2012-11-11 NOTE — Patient Instructions (Addendum)
Fungus Infection of the Skin  An infection of your skin caused by a fungus is a very common problem. Treatment depends on which part of the body is affected. Types of fungal skin infection include:  · Athlete's Foot(Tinea pedis). This infection starts between the toes and may involve the entire sole and sides of foot. It is the most common fungal disease. It is made worse by heat, moisture, and friction. To treat, wash your feet 2 to 3 times daily. Dry thoroughly between the toes. Use medicated foot powder or cream as directed on the package. Plain talc, cornstarch, or rice powder may be dusted into socks and shoes to keep the feet dry. Wearing footwear that allows ventilation is also helpful.  · Ringworm (Tinea corporis and tinea capitis). This infection causes scaly red rings to form on the skin or scalp. For skin sores, apply medicated lotion or cream as directed on the package. For the scalp, medicated shampoo may be used with with other therapies. Ringworm of the scalp or fingernails usually requires using oral medicine for 2 to 4 months.  · Tinea versicolor. This infection appears as painless, scaly, patchy areas of discolored skin (whitish to light brown). It is more common in the summer and favors oily areas of the skin such as those found at the chest, abdomen, back, pubis, neck, and body folds. It can be treated with medicated shampoo or with medicated topical cream. Oral antifungals may be needed for more active infections. The light and/or dark spots may take time to get better and is not a sign of treatment failure.  Fungal infections may need to be treated for several weeks to be cured. It is important not to treat fungal infections with steroids or combination medicine that contains an antifungal and steroid as these will make the fungal infection worse.  SEEK MEDICAL CARE IF:   · You have persistent itching or rawness.  · You have an oral temperature above 102° F (38.9° C).  Document Released:  03/28/2004 Document Revised: 05/13/2011 Document Reviewed: 06/13/2009  [employer]® Patient Information ©2013 [employer].

## 2012-11-11 NOTE — Telephone Encounter (Signed)
She called a different store and they will fill it for her for $88.00  She is going to pick up the med

## 2012-11-15 ENCOUNTER — Encounter: Payer: Self-pay | Admitting: Family Medicine

## 2012-11-15 DIAGNOSIS — B354 Tinea corporis: Secondary | ICD-10-CM

## 2012-11-15 HISTORY — DX: Tinea corporis: B35.4

## 2012-11-15 NOTE — Progress Notes (Signed)
Patient ID: Taylor Vaughn, female   DOB: October 17, 1929, 79 y.o.   MRN: [id number] Taylor Vaughn [id number] [birth year] 11/15/2012      Progress Note-Follow Up  Subjective  Chief Complaint  Chief Complaint  Patient presents with  . Rash    under breast- mostly rash, left foot also, sometimes itchy or hurts X 2 months    HPI  Patient 79 year old Caucasian female in today complaining of rash. She's had a long history of trouble with rash under her breasts but over the last 2 months it's been worse. It's been itchy and painful. Red and inflamed. Usual powders her treatments have been helping. She now has a scaly red lesion on her left great toe as well. No fevers or chills. No other recent illness. She's taking her medications as prescribed. No chest pain, palpitations, shortness of breath, GI or GU concerns.   Past Medical History  Diagnosis Date  . Chicken pox as a child  . Measles 17  . Mumps as a child  . Whooping cough 7 yrs old  . Hypertension   . Hyperlipidemia   . Insomnia   . Colon polyps     adenomatous, recurrrent, due  again 12/13  . Urinary frequency 07/22/2011  . Acute chest wall pain 07/22/2011  . Preventative health care 07/22/2011  . Right shoulder strain 10/03/2011  . MRSA (methicillin resistant staph aureus) culture positive 10/03/2011  . Grief reaction 10/03/2011  . Candidal skin infection 10/03/2011  . Allergic rhinitis 11/22/2011  . Renal artery stenosis 11/24/2011  . Cervical cancer screening 03/19/2012  . Tinea corporis 11/15/2012    Under both breasts R>L, left foot.     Past Surgical History  Procedure Laterality Date  . Cholecystectomy  1992  . Skin biopsy      multiple, all benign    Family History  Problem Relation Age of Onset  . Cancer Mother 26    breast- remission, late 27's lung cancer  . Hypertension Mother     ?  Marland Kitchen Angina Father   . Diabetes Father     type 2  . Heart disease Father   . Obesity Sister   . Diabetes Sister     type 2  .  Hyperlipidemia Sister   . Heart attack Sister   . Spina bifida Brother   . Constipation Daughter   . Hypertension Daughter   . Hyperlipidemia Daughter   . Obesity Son   . Hypertension Son   . Obesity Maternal Grandmother   . Heart attack Maternal Grandmother   . Stroke Paternal Grandfather     History   Social History  . Marital Status: Widowed    Spouse Name: N/A    Number of Children: N/A  . Years of Education: N/A   Occupational History  . Not on file.   Social History Main Topics  . Smoking status: Former Smoker    Types: Cigarettes  . Smokeless tobacco: Never Used     Comment: smoked for 3 years in 30's  . Alcohol Use: No  . Drug Use: No  . Sexual Activity: No   Other Topics Concern  . Not on file   Social History Narrative  . No narrative on file    Current Outpatient Prescriptions on File Prior to Visit  Medication Sig Dispense Refill  . Calcium Carbonate-Vit D-Min (CALCIUM 1200 PO) Take 1 tablet by mouth 2 (two) times daily.      . clotrimazole-betamethasone (LOTRISONE) cream Apply topically 2 (two)  times daily.  30 g  0  . co-enzyme Q-10 30 MG capsule Take 30 mg by mouth daily.      . diazepam (VALIUM) 5 MG tablet Take 1 tablet (5 mg total) by mouth 2 (two) times daily as needed for anxiety or sleep.  60 tablet  1  . fish oil-omega-3 fatty acids 1000 MG capsule Take 2 g by mouth daily.      Marland Kitchen GARLIC PO Take by mouth.      . IRON PO Take 18 mg by mouth daily.      . metoprolol succinate (TOPROL-XL) 25 MG 24 hr tablet Take 1 tablet (25 mg total) by mouth daily.  90 tablet  1  . Multiple Vitamin (MULTIVITAMIN) tablet Take 1 tablet by mouth daily.      . niacin 500 MG tablet Take 2 tablets (1,000 mg total) by mouth daily with breakfast.      . NON FORMULARY 2-3 cloves of raw garlic daily      . Probiotic Product (PHILLIPS COLON HEALTH PO) Take by mouth.      . sodium chloride (AYR) 0.65 % nasal spray Place 2 sprays into the nose as needed for congestion  (twice daily as needed).  30 mL  2  . Soya Lecithin 1200 MG CAPS Take 2,400 mg by mouth daily.      . vitamin E 400 UNIT capsule Take 400 Units by mouth daily.       No current facility-administered medications on file prior to visit.    Allergies  Allergen Reactions  . Lisinopril Cough  . Statins     Muscle cramps, rashes, loss of hair  . Keflex [Cephalexin] Rash  . Losartan     phlegm    Review of Systems  Review of Systems  Constitutional: Negative for fever and malaise/fatigue.  HENT: Negative for congestion.   Eyes: Negative for discharge.  Respiratory: Negative for shortness of breath.   Cardiovascular: Negative for chest pain, palpitations and leg swelling.  Gastrointestinal: Negative for nausea, abdominal pain and diarrhea.  Genitourinary: Negative for dysuria.  Musculoskeletal: Negative for falls.  Skin: Positive for itching and rash.  Neurological: Negative for loss of consciousness and headaches.  Endo/Heme/Allergies: Negative for polydipsia.  Psychiatric/Behavioral: Negative for depression and suicidal ideas. The patient is not nervous/anxious and does not have insomnia.     Objective  BP 120/78  Pulse 70  Temp(Src) 98.3 F (36.8 C) (Oral)  Ht 5' 3.25" (1.607 m)  Wt 167 lb 1.9 oz (75.805 kg)  BMI 29.35 kg/m2  SpO2 98%  Physical Exam  Physical Exam  Constitutional: She is oriented to person, place, and time and well-developed, well-nourished, and in no distress. No distress.  HENT:  Head: Normocephalic and atraumatic.  Eyes: Conjunctivae are normal.  Neck: Neck supple. No thyromegaly present.  Cardiovascular: Normal rate, regular rhythm and normal heart sounds.   No murmur heard. Pulmonary/Chest: Effort normal and breath sounds normal. She has no wheezes.  Abdominal: She exhibits no distension and no mass.  Musculoskeletal: She exhibits no edema.  Lymphadenopathy:    She has no cervical adenopathy.  Neurological: She is alert and oriented to  person, place, and time.  Skin: Skin is warm and dry. Rash noted. She is not diaphoretic. There is erythema.  Erythematous scaly maculopapular lesions almost plaque size under R.L breast and on left great toe.  Psychiatric: Memory, affect and judgment normal.    Lab Results  Component Value Date  TSH 3.594 11/11/2012   Lab Results  Component Value Date   WBC 6.4 11/11/2012   HGB 14.3 11/11/2012   HCT 41.5 11/11/2012   MCV 94.3 11/11/2012   PLT 318 11/11/2012   Lab Results  Component Value Date   CREATININE 0.83 11/11/2012   BUN 21 11/11/2012   NA 140 11/11/2012   K 4.9 11/11/2012   CL 104 11/11/2012   CO2 31 11/11/2012   Lab Results  Component Value Date   ALT 13 11/11/2012   AST 16 11/11/2012   ALKPHOS 47 11/11/2012   BILITOT 0.6 11/11/2012   Lab Results  Component Value Date   CHOL 271* 11/11/2012   Lab Results  Component Value Date   HDL 46 11/11/2012   Lab Results  Component Value Date   LDLCALC 196* 11/11/2012   Lab Results  Component Value Date   TRIG 146 11/11/2012   Lab Results  Component Value Date   CHOLHDL 5.9 11/11/2012     Assessment & Plan  Hypertension Well controlled no changes.  Tinea corporis Started on Itraconazole due to the diffuse and long lived nature of rash, may apply Lotrisone bid.

## 2012-11-15 NOTE — Assessment & Plan Note (Signed)
Well controlled no changes 

## 2012-11-15 NOTE — Assessment & Plan Note (Addendum)
Started on Itraconazole due to the diffuse and long lived nature of rash, may apply Lotrisone bid.

## 2012-11-16 ENCOUNTER — Telehealth: Payer: Self-pay

## 2012-11-16 NOTE — Telephone Encounter (Signed)
So any redness or warmth? If no unlikely to be a reaction to meds but she can hold rest of med for next 3 days and see if it improves. If she is worried we can take a look at it tomorrow or she can go to an urgent facility

## 2012-11-16 NOTE — Telephone Encounter (Signed)
Pt states she just took another one about 12:30 and its starting to swell again. Pt states it is just a little warm and there is no redness. Pt states she won't take the last two but is wanting to come in to see MD tomorrow. Pt was offered an appt with Selena Batten or Melissa tomorrow due to MD not having any appts available. Pt only wants to see MD. Appt made for 11-18-12 at 11:15

## 2012-11-16 NOTE — Telephone Encounter (Signed)
Patient left a message stating that her left side of her face is swelled up since taking the fungal medication? Pt stated on message that it is painful only when touching. Pt would like to know if she is to continue this or come in to see MD?   Please advise?

## 2012-11-18 ENCOUNTER — Encounter: Payer: Self-pay | Admitting: Family Medicine

## 2012-11-18 ENCOUNTER — Ambulatory Visit (INDEPENDENT_AMBULATORY_CARE_PROVIDER_SITE_OTHER): Payer: Medicare Other | Admitting: Family Medicine

## 2012-11-18 VITALS — BP 120/84 | HR 71 | Temp 97.6°F | Ht 63.25 in | Wt 167.0 lb

## 2012-11-18 DIAGNOSIS — I10 Essential (primary) hypertension: Secondary | ICD-10-CM

## 2012-11-18 DIAGNOSIS — B354 Tinea corporis: Secondary | ICD-10-CM

## 2012-11-18 MED ORDER — FLUCONAZOLE 150 MG PO TABS: 150.0000 mg | ORAL_TABLET | ORAL | Status: DC

## 2012-11-18 NOTE — Progress Notes (Signed)
Patient ID: Nayali Talerico, female   DOB: 1929/11/07, 79 y.o.   MRN: [id number] Jacqeline Broers [id number] 29-Nov-1929 11/18/2012      Progress Note-Follow Up  Subjective  Chief Complaint  Chief Complaint  Patient presents with  . Follow-up    on fungus and medication reaction    HPI  Patient is a 41-year-old female who is in today to reassess rash. Erythromycin 100 and ketoconazole and nystatin. She is concerned that she had a reaction itraconazole should swelling in her left cheek which was mildly uncomfortable she stopped the hydrocortisone and she felt it got better but then it turned the next day after another dose of medicine. It was mildly tender. There were no fevers or chills. No shortness of breath, palpitations, chest pain, GI or GU concerns otherwise noted.  Past Medical History  Diagnosis Date  . Chicken pox as a child  . Measles 17  . Mumps as a child  . Whooping cough 7 yrs old  . Hypertension   . Hyperlipidemia   . Insomnia   . Colon polyps     adenomatous, recurrrent, due  again 12/13  . Urinary frequency 07/22/2011  . Acute chest wall pain 07/22/2011  . Preventative health care 07/22/2011  . Right shoulder strain 10/03/2011  . MRSA (methicillin resistant staph aureus) culture positive 10/03/2011  . Grief reaction 10/03/2011  . Candidal skin infection 10/03/2011  . Allergic rhinitis 11/22/2011  . Renal artery stenosis 11/24/2011  . Cervical cancer screening 03/19/2012  . Tinea corporis 11/15/2012    Under both breasts R>L, left foot.     Past Surgical History  Procedure Laterality Date  . Cholecystectomy  1992  . Skin biopsy      multiple, all benign    Family History  Problem Relation Age of Onset  . Cancer Mother 12    breast- remission, late 28's lung cancer  . Hypertension Mother     ?  Marland Kitchen Angina Father   . Diabetes Father     type 2  . Heart disease Father   . Obesity Sister   . Diabetes Sister     type 2  . Hyperlipidemia Sister   . Heart attack  Sister   . Spina bifida Brother   . Constipation Daughter   . Hypertension Daughter   . Hyperlipidemia Daughter   . Obesity Son   . Hypertension Son   . Obesity Maternal Grandmother   . Heart attack Maternal Grandmother   . Stroke Paternal Grandfather     History   Social History  . Marital Status: Widowed    Spouse Name: N/A    Number of Children: N/A  . Years of Education: N/A   Occupational History  . Not on file.   Social History Main Topics  . Smoking status: Former Smoker    Types: Cigarettes  . Smokeless tobacco: Never Used     Comment: smoked for 3 years in 30's  . Alcohol Use: No  . Drug Use: No  . Sexual Activity: No   Other Topics Concern  . Not on file   Social History Narrative  . No narrative on file    Current Outpatient Prescriptions on File Prior to Visit  Medication Sig Dispense Refill  . Calcium Carbonate-Vit D-Min (CALCIUM 1200 PO) Take 1 tablet by mouth 2 (two) times daily.      . clobetasol cream (TEMOVATE) 0.05 % Apply topically 2 (two) times daily. Start at bed time can increase to twice  a day if still present  60 g  1  . co-enzyme Q-10 30 MG capsule Take 30 mg by mouth daily.      . diazepam (VALIUM) 5 MG tablet Take 1 tablet (5 mg total) by mouth 2 (two) times daily as needed for anxiety or sleep.  60 tablet  1  . fish oil-omega-3 fatty acids 1000 MG capsule Take 2 g by mouth daily.      Marland Kitchen GARLIC PO Take by mouth.      . IRON PO Take 18 mg by mouth daily.      Marland Kitchen itraconazole (SPORANOX) 100 MG capsule Take 2 capsules (200 mg total) by mouth daily.  14 capsule  0  . metoprolol succinate (TOPROL-XL) 25 MG 24 hr tablet Take 1 tablet (25 mg total) by mouth daily.  90 tablet  1  . Multiple Vitamin (MULTIVITAMIN) tablet Take 1 tablet by mouth daily.      . niacin 500 MG tablet Take 2 tablets (1,000 mg total) by mouth daily with breakfast.      . NON FORMULARY 2-3 cloves of raw garlic daily      . Probiotic Product (PHILLIPS COLON HEALTH PO) Take  by mouth.      . sodium chloride (AYR) 0.65 % nasal spray Place 2 sprays into the nose as needed for congestion (twice daily as needed).  30 mL  2  . Soya Lecithin 1200 MG CAPS Take 2,400 mg by mouth daily.      . vitamin E 400 UNIT capsule Take 400 Units by mouth daily.       No current facility-administered medications on file prior to visit.    Allergies  Allergen Reactions  . Lisinopril Cough  . Statins     Muscle cramps, rashes, loss of hair  . Keflex [Cephalexin] Rash  . Losartan     phlegm    Review of Systems  Review of Systems  Constitutional: Negative for fever and malaise/fatigue.  HENT: Negative for congestion.   Eyes: Negative for discharge.  Respiratory: Negative for shortness of breath.   Cardiovascular: Negative for chest pain, palpitations and leg swelling.  Gastrointestinal: Negative for nausea, abdominal pain and diarrhea.  Genitourinary: Negative for dysuria.  Musculoskeletal: Negative for falls.  Skin: Positive for itching and rash.  Neurological: Negative for loss of consciousness and headaches.  Endo/Heme/Allergies: Negative for polydipsia.  Psychiatric/Behavioral: Negative for depression and suicidal ideas. The patient is not nervous/anxious and does not have insomnia.     Objective  BP 120/84  Pulse 71  Temp(Src) 97.6 F (36.4 C) (Oral)  Ht 5' 3.25" (1.607 m)  Wt 167 lb (75.751 kg)  BMI 29.33 kg/m2  SpO2 96%  Physical Exam  Physical Exam  Constitutional: She is oriented to person, place, and time and well-developed, well-nourished, and in no distress. No distress.  HENT:  Head: Normocephalic and atraumatic.  Eyes: Conjunctivae are normal.  Neck: Neck supple. No thyromegaly present.  Cardiovascular: Normal rate, regular rhythm and normal heart sounds.   No murmur heard. Pulmonary/Chest: Effort normal and breath sounds normal. She has no wheezes.  Abdominal: She exhibits no distension and no mass.  Musculoskeletal: She exhibits no  edema.  Lymphadenopathy:    She has no cervical adenopathy.  Neurological: She is alert and oriented to person, place, and time.  Skin: Skin is warm and dry. Rash noted. She is not diaphoretic. There is erythema.  Rash under breasts still present but improved. Less erythematous and swollen  Psychiatric: Memory, affect and judgment normal.    Lab Results  Component Value Date   TSH 3.594 11/11/2012   Lab Results  Component Value Date   WBC 6.4 11/11/2012   HGB 14.3 11/11/2012   HCT 41.5 11/11/2012   MCV 94.3 11/11/2012   PLT 318 11/11/2012   Lab Results  Component Value Date   CREATININE 0.83 11/11/2012   BUN 21 11/11/2012   NA 140 11/11/2012   K 4.9 11/11/2012   CL 104 11/11/2012   CO2 31 11/11/2012   Lab Results  Component Value Date   ALT 13 11/11/2012   AST 16 11/11/2012   ALKPHOS 47 11/11/2012   BILITOT 0.6 11/11/2012   Lab Results  Component Value Date   CHOL 271* 11/11/2012   Lab Results  Component Value Date   HDL 46 11/11/2012   Lab Results  Component Value Date   LDLCALC 196* 11/11/2012   Lab Results  Component Value Date   TRIG 146 11/11/2012   Lab Results  Component Value Date   CHOLHDL 5.9 11/11/2012     Assessment & Plan  Hypertension Well controlled on current meds, no changes.  Tinea corporis Improved greatly, swelling in face sounds more like parotiditis than an allergic reaction. Resolved at today's visit. Stop Itraconazole as a precaution continue Nystatin and can use Diflucan if rash worsens

## 2012-11-18 NOTE — Patient Instructions (Addendum)
Parotid gland, (itis), consider, suck on sour candy  For now cleanse area with distilled white vinegar prior to bed then apply Nystatin, in am cleanse with Witch hazel and apply Nystatin. If rash worsens then start the Diflucan/Fluconazole once a week if swelling in face returns then stop the new med take a Benadryl or Zyrtec and call me    Body Ringworm Ringworm (tinea corporis) is a fungal infection of the skin on the body. This infection is not caused by worms, but is actually caused by a fungus. Fungus normally lives on the top of your skin and can be useful. However, in the case of ringworms, the fungus grows out of control and causes a skin infection. It can involve any area of skin on the body and can spread easily from one person to another (contagious). Ringworm is a common problem for children, but it can affect adults as well. Ringworm is also often found in athletes, especially wrestlers who share equipment and mats.  CAUSES  Ringworm of the body is caused by a fungus called dermatophyte. It can spread by:  Touchingother people who are infected.  Touchinginfected pets.  Touching or sharingobjects that have been in contact with the infected person or pet (hats, combs, towels, clothing, sports equipment). SYMPTOMS   Itchy, raised red spots and bumps on the skin.  Ring-shaped rash.  Redness near the border of the rash with a clear center.  Dry and scaly skin on or around the rash. Not every person develops a ring-shaped rash. Some develop only the red, scaly patches. DIAGNOSIS  Most often, ringworm can be diagnosed by performing a skin exam. Your caregiver may choose to take a skin scraping from the affected area. The sample will be examined under the microscope to see if the fungus is present.  TREATMENT  Body ringworm may be treated with a topical antifungal cream or ointment. Sometimes, an antifungal shampoo that can be used on your body is prescribed. You may be  prescribed antifungal medicines to take by mouth if your ringworm is severe, keeps coming back, or lasts a long time.  HOME CARE INSTRUCTIONS   Only take over-the-counter or prescription medicines as directed by your caregiver.  Wash the infected area and dry it completely before applying yourcream or ointment.  When using antifungal shampoo to treat the ringworm, leave the shampoo on the body for 3 5 minutes before rinsing.   Wear loose clothing to stop clothes from rubbing and irritating the rash.  Wash or change your bed sheets every night while you have the rash.  Have your pet treated by your veterinarian if it has the same infection. To prevent ringworm:   Practice good hygiene.  Wear sandals or shoes in public places and showers.  Do not share personal items with others.  Avoid touching red patches of skin on other people.  Avoid touching pets that have bald spots or wash your hands after doing so. SEEK MEDICAL CARE IF:   Your rash continues to spread after 7 days of treatment.  Your rash is not gone in 4 weeks.  The area around your rash becomes red, warm, tender, and swollen. Document Released: 02/16/2000 Document Revised: 11/13/2011 Document Reviewed: 09/02/2011 [facility] Patient Information 2014 [place], [place].

## 2012-11-21 NOTE — Assessment & Plan Note (Signed)
Well controlled on current meds, no changes 

## 2012-11-21 NOTE — Assessment & Plan Note (Signed)
Improved greatly, swelling in face sounds more like parotiditis than an allergic reaction. Resolved at today's visit. Stop Itraconazole as a precaution continue Nystatin and can use Diflucan if rash worsens

## 2012-12-07 ENCOUNTER — Encounter: Payer: Medicare Other | Admitting: Family Medicine

## 2013-01-04 ENCOUNTER — Encounter: Payer: Medicare Other | Admitting: Family Medicine

## 2013-01-08 ENCOUNTER — Ambulatory Visit (INDEPENDENT_AMBULATORY_CARE_PROVIDER_SITE_OTHER): Payer: Medicare Other | Admitting: Family Medicine

## 2013-01-08 ENCOUNTER — Telehealth: Payer: Self-pay | Admitting: Family Medicine

## 2013-01-08 ENCOUNTER — Encounter: Payer: Self-pay | Admitting: Family Medicine

## 2013-01-08 VITALS — BP 142/84 | HR 68 | Temp 97.8°F | Ht 63.25 in | Wt 168.1 lb

## 2013-01-08 DIAGNOSIS — B372 Candidiasis of skin and nail: Secondary | ICD-10-CM

## 2013-01-08 DIAGNOSIS — E785 Hyperlipidemia, unspecified: Secondary | ICD-10-CM

## 2013-01-08 DIAGNOSIS — I10 Essential (primary) hypertension: Secondary | ICD-10-CM

## 2013-01-08 DIAGNOSIS — B354 Tinea corporis: Secondary | ICD-10-CM

## 2013-01-08 DIAGNOSIS — Z0000 Encounter for general adult medical examination without abnormal findings: Secondary | ICD-10-CM

## 2013-01-08 DIAGNOSIS — H269 Unspecified cataract: Secondary | ICD-10-CM

## 2013-01-08 DIAGNOSIS — G4700 Insomnia, unspecified: Secondary | ICD-10-CM

## 2013-01-08 MED ORDER — METOPROLOL SUCCINATE ER 25 MG PO TB24: 25.0000 mg | ORAL_TABLET | Freq: Every day | ORAL | Status: DC

## 2013-01-08 MED ORDER — DIAZEPAM 5 MG PO TABS: 2.5000 mg | ORAL_TABLET | Freq: Two times a day (BID) | ORAL | Status: DC | PRN

## 2013-01-08 NOTE — Telephone Encounter (Signed)
Lab orders entered
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
## 2013-01-08 NOTE — Patient Instructions (Signed)
Aspercreme or Salon Pas patches or cream   Arthritis, Nonspecific Arthritis is inflammation of a joint. This usually means pain, redness, warmth or swelling are present. One or more joints may be involved. There are a number of types of arthritis. Your caregiver may not be able to tell what type of arthritis you have right away. CAUSES  The most common cause of arthritis is the wear and tear on the joint (osteoarthritis). This causes damage to the cartilage, which can break down over time. The knees, hips, back and neck are most often affected by this type of arthritis. Other types of arthritis and common causes of joint pain include:  Sprains and other injuries near the joint. Sometimes minor sprains and injuries cause pain and swelling that develop hours later.  Rheumatoid arthritis. This affects hands, feet and knees. It usually affects both sides of your body at the same time. It is often associated with chronic ailments, fever, weight loss and general weakness.  Crystal arthritis. Gout and pseudo gout can cause occasional acute severe pain, redness and swelling in the foot, ankle, or knee.  Infectious arthritis. Bacteria can get into a joint through a break in overlying skin. This can cause infection of the joint. Bacteria and viruses can also spread through the blood and affect your joints.  Drug, infectious and allergy reactions. Sometimes joints can become mildly painful and slightly swollen with these types of illnesses. SYMPTOMS   Pain is the main symptom.  Your joint or joints can also be red, swollen and warm or hot to the touch.  You may have a fever with certain types of arthritis, or even feel overall ill.  The joint with arthritis will hurt with movement. Stiffness is present with some types of arthritis. DIAGNOSIS  Your caregiver will suspect arthritis based on your description of your symptoms and on your exam. Testing may be needed to find the type of  arthritis:  Blood and sometimes urine tests.  X-ray tests and sometimes CT or MRI scans.  Removal of fluid from the joint (arthrocentesis) is done to check for bacteria, crystals or other causes. Your caregiver (or a specialist) will numb the area over the joint with a local anesthetic, and use a needle to remove joint fluid for examination. This procedure is only minimally uncomfortable.  Even with these tests, your caregiver may not be able to tell what kind of arthritis you have. Consultation with a specialist (rheumatologist) may be helpful. TREATMENT  Your caregiver will discuss with you treatment specific to your type of arthritis. If the specific type cannot be determined, then the following general recommendations may apply. Treatment of severe joint pain includes:  Rest.  Elevation.  Anti-inflammatory medication (for example, ibuprofen) may be prescribed. Avoiding activities that cause increased pain.  Only take over-the-counter or prescription medicines for pain and discomfort as recommended by your caregiver.  Cold packs over an inflamed joint may be used for 10 to 15 minutes every hour. Hot packs sometimes feel better, but do not use overnight. Do not use hot packs if you are diabetic without your caregiver's permission.  A cortisone shot into arthritic joints may help reduce pain and swelling.  Any acute arthritis that gets worse over the next 1 to 2 days needs to be looked at to be sure there is no joint infection. Long-term arthritis treatment involves modifying activities and lifestyle to reduce joint stress jarring. This can include weight loss. Also, exercise is needed to nourish the joint  cartilage and remove waste. This helps keep the muscles around the joint strong. HOME CARE INSTRUCTIONS   Do not take aspirin to relieve pain if gout is suspected. This elevates uric acid levels.  Only take over-the-counter or prescription medicines for pain, discomfort or fever as  directed by your caregiver.  Rest the joint as much as possible.  If your joint is swollen, keep it elevated.  Use crutches if the painful joint is in your leg.  Drinking plenty of fluids may help for certain types of arthritis.  Follow your caregiver's dietary instructions.  Try low-impact exercise such as:  Swimming.  Water aerobics.  Biking.  Walking.  Morning stiffness is often relieved by a warm shower.  Put your joints through regular range-of-motion. SEEK MEDICAL CARE IF:   You do not feel better in 24 hours or are getting worse.  You have side effects to medications, or are not getting better with treatment. SEEK IMMEDIATE MEDICAL CARE IF:   You have a fever.  You develop severe joint pain, swelling or redness.  Many joints are involved and become painful and swollen.  There is severe back pain and/or leg weakness.  You have loss of bowel or bladder control. Document Released: 03/28/2004 Document Revised: 05/13/2011 Document Reviewed: 04/13/2008 [facility] Patient Information 2014 [place].

## 2013-01-08 NOTE — Telephone Encounter (Signed)
Labs prior lipid, renal, cbc, tsh, hepatic   Patient has appointment in early march/2015 and will be going to [facility] lab.

## 2013-01-11 ENCOUNTER — Encounter: Payer: Self-pay | Admitting: Family Medicine

## 2013-01-11 DIAGNOSIS — H269 Unspecified cataract: Secondary | ICD-10-CM

## 2013-01-11 HISTORY — DX: Unspecified cataract: H26.9

## 2013-01-11 NOTE — Assessment & Plan Note (Signed)
Well controlled, no changes 

## 2013-01-11 NOTE — Assessment & Plan Note (Signed)
Acceptable response to current topical and oral medications.

## 2013-01-11 NOTE — Assessment & Plan Note (Signed)
Under breasts mostly may continue Nystatin and diflucan prn. Add a probiotic

## 2013-01-11 NOTE — Assessment & Plan Note (Signed)
Has surgery on November 25 to have left eye done first by Dr Elmer Picker

## 2013-01-11 NOTE — Assessment & Plan Note (Addendum)
Avoid trans fats, increase exercise as tolerated. Continue krill oil, discussed possible need for red yeast rice or statin at next blood draw, patient hesitant.

## 2013-04-09 ENCOUNTER — Telehealth (HOSPITAL_COMMUNITY): Payer: Self-pay | Admitting: *Deleted

## 2013-04-14 ENCOUNTER — Other Ambulatory Visit: Payer: Self-pay

## 2013-04-14 DIAGNOSIS — I10 Essential (primary) hypertension: Secondary | ICD-10-CM

## 2013-04-14 MED ORDER — METOPROLOL SUCCINATE ER 25 MG PO TB24: 25.0000 mg | ORAL_TABLET | Freq: Every day | ORAL | Status: DC

## 2013-04-14 NOTE — Telephone Encounter (Signed)
Patient called asking that we send her Metoprolol to [facility] mail order.  Pt informed RX sent

## 2013-04-15 ENCOUNTER — Telehealth: Payer: Self-pay | Admitting: Family Medicine

## 2013-04-15 DIAGNOSIS — I10 Essential (primary) hypertension: Secondary | ICD-10-CM

## 2013-04-15 MED ORDER — METOPROLOL SUCCINATE ER 25 MG PO TB24: 25.0000 mg | ORAL_TABLET | Freq: Every day | ORAL | Status: DC

## 2013-04-15 NOTE — Telephone Encounter (Signed)
Patient called Taylor Vaughn after speaking with you and they state they did not receive any prescription information.  She needs you to call them at [phone] and straighten this out or she will soon be out of meds.

## 2013-04-15 NOTE — Telephone Encounter (Signed)
Resent medication electronically and faxed and RX.  Sometimes it takes 24 hours for the RXs to show up in mail orders computers.

## 2013-05-05 LAB — RENAL FUNCTION PANEL
Albumin: 4.1 g/dL (ref 3.5–5.2)
BUN: 20 mg/dL (ref 6–23)
CO2: 28 mEq/L (ref 19–32)
Calcium: 10 mg/dL (ref 8.4–10.5)
Chloride: 106 mEq/L (ref 96–112)
Creat: 0.73 mg/dL (ref 0.50–1.10)
Glucose, Bld: 92 mg/dL (ref 70–99)
Phosphorus: 3 mg/dL (ref 2.3–4.6)
Potassium: 4.6 mEq/L (ref 3.5–5.3)
Sodium: 140 mEq/L (ref 135–145)

## 2013-05-05 LAB — LIPID PANEL
Cholesterol: 267 mg/dL — ABNORMAL HIGH (ref 0–200)
HDL: 48 mg/dL (ref >39)
LDL Cholesterol: 194 mg/dL — ABNORMAL HIGH (ref 0–99)
Total CHOL/HDL Ratio: 5.6 Ratio
Triglycerides: 126 mg/dL (ref <150)
VLDL: 25 mg/dL (ref 0–40)

## 2013-05-05 LAB — CBC
HCT: 43.3 % (ref 36.0–46.0)
Hemoglobin: 14.7 g/dL (ref 12.0–15.0)
MCH: 31.9 pg (ref 26.0–34.0)
MCHC: 33.9 g/dL (ref 30.0–36.0)
MCV: 93.9 fL (ref 78.0–100.0)
Platelets: 352 10*3/uL (ref 150–400)
RBC: 4.61 MIL/uL (ref 3.87–5.11)
RDW: 13.7 % (ref 11.5–15.5)
WBC: 6.5 10*3/uL (ref 4.0–10.5)

## 2013-05-05 LAB — HEPATIC FUNCTION PANEL
ALT: 16 U/L (ref 0–35)
AST: 15 U/L (ref 0–37)
Albumin: 4.1 g/dL (ref 3.5–5.2)
Alkaline Phosphatase: 43 U/L (ref 39–117)
Bilirubin, Direct: 0.1 mg/dL (ref 0.0–0.3)
Indirect Bilirubin: 0.5 mg/dL (ref 0.2–1.2)
Total Bilirubin: 0.6 mg/dL (ref 0.2–1.2)
Total Protein: 6 g/dL (ref 6.0–8.3)

## 2013-05-06 LAB — TSH: TSH: 4.113 u[IU]/mL (ref 0.350–4.500)

## 2013-05-13 ENCOUNTER — Encounter: Payer: Self-pay | Admitting: Family Medicine

## 2013-05-13 ENCOUNTER — Ambulatory Visit (INDEPENDENT_AMBULATORY_CARE_PROVIDER_SITE_OTHER): Payer: Medicare Other | Admitting: Family Medicine

## 2013-05-13 VITALS — BP 130/80 | HR 73 | Temp 97.9°F | Ht 63.25 in | Wt 169.0 lb

## 2013-05-13 DIAGNOSIS — K5730 Diverticulosis of large intestine without perforation or abscess without bleeding: Secondary | ICD-10-CM

## 2013-05-13 DIAGNOSIS — Z22322 Carrier or suspected carrier of Methicillin resistant Staphylococcus aureus: Secondary | ICD-10-CM

## 2013-05-13 DIAGNOSIS — B372 Candidiasis of skin and nail: Secondary | ICD-10-CM

## 2013-05-13 DIAGNOSIS — K5790 Diverticulosis of intestine, part unspecified, without perforation or abscess without bleeding: Secondary | ICD-10-CM

## 2013-05-13 DIAGNOSIS — E785 Hyperlipidemia, unspecified: Secondary | ICD-10-CM

## 2013-05-13 DIAGNOSIS — I10 Essential (primary) hypertension: Secondary | ICD-10-CM

## 2013-05-13 NOTE — Progress Notes (Signed)
Pre visit review using our clinic review tool, if applicable. No additional management support is needed unless otherwise documented below in the visit note. 

## 2013-05-13 NOTE — Progress Notes (Signed)
Patient ID: Taylor Vaughn, female   DOB: 1929-03-27, 80 y.o.   MRN: [id number] Shantese Raven [id number] 05-17-1929 05/13/2013      Progress Note-Follow Up  Subjective  Chief Complaint  Chief Complaint  Patient presents with  . Follow-up    4 month    HPI  Patient is a 80 year old female in today for routine medical care. Feeling well, managing her grief better at present. Staying very busy. No recent illness. Denies CP/palp/SOB/HA/congestion/fevers/GI or GU c/o. Taking meds as prescribed  Past Medical History  Diagnosis Date  . Chicken pox as a child  . Measles 17  . Mumps as a child  . Whooping cough 7 yrs old  . Hypertension   . Hyperlipidemia   . Insomnia   . Colon polyps     adenomatous, recurrrent, due  again 12/13  . Urinary frequency 07/22/2011  . Acute chest wall pain 07/22/2011  . Preventative health care 07/22/2011  . Right shoulder strain 10/03/2011  . MRSA (methicillin resistant staph aureus) culture positive 10/03/2011  . Grief reaction 10/03/2011  . Candidal skin infection 10/03/2011  . Allergic rhinitis 11/22/2011  . Renal artery stenosis 11/24/2011  . Cervical cancer screening 03/19/2012  . Tinea corporis 11/15/2012    Under both breasts R>L, left foot.   . Cataract 01/11/2013    Past Surgical History  Procedure Laterality Date  . Cholecystectomy  1992  . Skin biopsy      multiple, all benign    Family History  Problem Relation Age of Onset  . Cancer Mother 1    breast- remission, late 19's lung cancer  . Hypertension Mother     ?  Marland Kitchen Angina Father   . Diabetes Father     type 2  . Heart disease Father   . Obesity Sister   . Diabetes Sister     type 2  . Hyperlipidemia Sister   . Heart attack Sister   . Spina bifida Brother   . Constipation Daughter   . Hypertension Daughter   . Hyperlipidemia Daughter   . Obesity Son   . Hypertension Son   . Obesity Maternal Grandmother   . Heart attack Maternal Grandmother   . Stroke Paternal  Grandfather     History   Social History  . Marital Status: Widowed    Spouse Name: N/A    Number of Children: N/A  . Years of Education: N/A   Occupational History  . Not on file.   Social History Main Topics  . Smoking status: Former Smoker    Types: Cigarettes  . Smokeless tobacco: Never Used     Comment: smoked for 3 years in 30's  . Alcohol Use: No  . Drug Use: No  . Sexual Activity: No   Other Topics Concern  . Not on file   Social History Narrative  . No narrative on file    Current Outpatient Prescriptions on File Prior to Visit  Medication Sig Dispense Refill  . Calcium Carbonate-Vit D-Min (CALCIUM 1200 PO) Take 1 tablet by mouth 2 (two) times daily.      . clobetasol cream (TEMOVATE) 0.05 % Apply topically 2 (two) times daily. Start at bed time can increase to twice a day if still present  60 g  1  . co-enzyme Q-10 30 MG capsule Take 30 mg by mouth daily.      . diazepam (VALIUM) 5 MG tablet Take 0.5 tablets (2.5 mg total) by mouth 2 (two) times  daily as needed for anxiety (sleep).  30 tablet  2  . fish oil-omega-3 fatty acids 1000 MG capsule Take 2 g by mouth daily.      Marland Kitchen GARLIC PO Take by mouth.      . IRON PO Take 18 mg by mouth daily.      Marland Kitchen itraconazole (SPORANOX) 100 MG capsule Take 2 capsules (200 mg total) by mouth daily.  14 capsule  0  . metoprolol succinate (TOPROL-XL) 25 MG 24 hr tablet Take 1 tablet (25 mg total) by mouth daily.  90 tablet  1  . Multiple Vitamin (MULTIVITAMIN) tablet Take 1 tablet by mouth daily.      . niacin 500 MG tablet Take 2 tablets (1,000 mg total) by mouth daily with breakfast.      . NON FORMULARY 2-3 cloves of raw garlic daily      . Probiotic Product (Ismay) Take by mouth.      . sodium chloride (AYR) 0.65 % nasal spray Place 2 sprays into the nose as needed for congestion (twice daily as needed).  30 mL  2  . Soya Lecithin 1200 MG CAPS Take 2,400 mg by mouth daily.      . vitamin E 400 UNIT capsule  Take 400 Units by mouth daily.       No current facility-administered medications on file prior to visit.    Allergies  Allergen Reactions  . Lisinopril Cough  . Statins     Muscle cramps, rashes, loss of hair  . Keflex [Cephalexin] Rash  . Losartan     phlegm    Review of Systems  Review of Systems  Constitutional: Negative for fever and malaise/fatigue.  HENT: Negative for congestion.   Eyes: Negative for discharge.  Respiratory: Negative for shortness of breath.   Cardiovascular: Negative for chest pain, palpitations and leg swelling.  Gastrointestinal: Negative for nausea, abdominal pain and diarrhea.  Genitourinary: Negative for dysuria.  Musculoskeletal: Negative for falls.  Skin: Negative for rash.  Neurological: Negative for loss of consciousness and headaches.  Endo/Heme/Allergies: Negative for polydipsia.  Psychiatric/Behavioral: Negative for depression and suicidal ideas. The patient is not nervous/anxious and does not have insomnia.     Objective  BP 130/80  Pulse 73  Temp(Src) 97.9 F (36.6 C) (Oral)  Ht 5' 3.25" (1.607 m)  Wt 169 lb (76.658 kg)  BMI 29.68 kg/m2  SpO2 97%  Physical Exam  Physical Exam  Constitutional: She is oriented to person, place, and time and well-developed, well-nourished, and in no distress. No distress.  HENT:  Head: Normocephalic and atraumatic.  Eyes: Conjunctivae are normal.  Neck: Neck supple. No thyromegaly present.  Cardiovascular: Normal rate, regular rhythm and normal heart sounds.   No murmur heard. Pulmonary/Chest: Effort normal and breath sounds normal. She has no wheezes.  Abdominal: She exhibits no distension and no mass.  Musculoskeletal: She exhibits no edema.  Lymphadenopathy:    She has no cervical adenopathy.  Neurological: She is alert and oriented to person, place, and time.  Skin: Skin is warm and dry. No rash noted. She is not diaphoretic.  Psychiatric: Memory, affect and judgment normal.    Lab  Results  Component Value Date   TSH 4.113 05/05/2013   Lab Results  Component Value Date   WBC 6.5 05/05/2013   HGB 14.7 05/05/2013   HCT 43.3 05/05/2013   MCV 93.9 05/05/2013   PLT 352 05/05/2013   Lab Results  Component Value Date  CREATININE 0.73 05/05/2013   BUN 20 05/05/2013   NA 140 05/05/2013   K 4.6 05/05/2013   CL 106 05/05/2013   CO2 28 05/05/2013   Lab Results  Component Value Date   ALT 16 05/05/2013   AST 15 05/05/2013   ALKPHOS 43 05/05/2013   BILITOT 0.6 05/05/2013   Lab Results  Component Value Date   CHOL 267* 05/05/2013   Lab Results  Component Value Date   HDL 48 05/05/2013   Lab Results  Component Value Date   LDLCALC 194* 05/05/2013   Lab Results  Component Value Date   TRIG 126 05/05/2013   Lab Results  Component Value Date   CHOLHDL 5.6 05/05/2013     Assessment & Plan  Candidal skin infection Improved greatly at present time. May continue meds prn  Hypertension Well controlled, no changes to meds. Encouraged heart healthy diet such as the DASH diet and exercise as tolerated.   Hyperlipidemia Encouraged heart healthy diet, increase exercise, avoid trans fats, consider a krill oil cap daily. Does not tolerate statins well.  MRSA (methicillin resistant staph aureus) culture positive No recent outbreaks

## 2013-05-13 NOTE — Patient Instructions (Signed)
Restart the red yeast rice if you are willing   Cholesterol Cholesterol is a white, waxy, fat-like protein needed by your body in small amounts. The liver makes all the cholesterol you need. It is carried from the liver by the blood through the blood vessels. Deposits (plaque) may build up on blood vessel walls. This makes the arteries narrower and stiffer. Plaque increases the risk for heart attack and stroke. You cannot feel your cholesterol level even if it is very high. The only way to know is by a blood test to check your lipid (fats) levels. Once you know your cholesterol levels, you should keep a record of the test results. Work with your caregiver to to keep your levels in the desired range. WHAT THE RESULTS MEAN:  Total cholesterol is a rough measure of all the cholesterol in your blood.  LDL is the so-called bad cholesterol. This is the type that deposits cholesterol in the walls of the arteries. You want this level to be low.  HDL is the good cholesterol because it cleans the arteries and carries the LDL away. You want this level to be high.  Triglycerides are fat that the body can either burn for energy or store. High levels are closely linked to heart disease. DESIRED LEVELS:  Total cholesterol below 200.  LDL below 100 for people at risk, below 70 for very high risk.  HDL above 50 is good, above 60 is best.  Triglycerides below 150. HOW TO LOWER YOUR CHOLESTEROL:  Diet.  Choose fish or white meat chicken and [place], roasted or baked. Limit fatty cuts of red meat, fried foods, and processed meats, such as sausage and lunch meat.  Eat lots of fresh fruits and vegetables. Choose whole grains, beans, pasta, potatoes and cereals.  Use only small amounts of olive, corn or canola oils. Avoid butter, mayonnaise, shortening or palm kernel oils. Avoid foods with trans-fats.  Use skim/nonfat milk and low-fat/nonfat yogurt and cheeses. Avoid whole milk, cream, ice cream, egg yolks  and cheeses. Healthy desserts include angel food cake, ginger snaps, animal crackers, hard candy, popsicles, and low-fat/nonfat frozen yogurt. Avoid pastries, cakes, pies and cookies.  Exercise.  A regular program helps decrease LDL and raises HDL.  Helps with weight control.  Do things that increase your activity level like gardening, walking, or taking the stairs.  Medication.  May be prescribed by your caregiver to help lowering cholesterol and the risk for heart disease.  You may need medicine even if your levels are normal if you have several risk factors. HOME CARE INSTRUCTIONS   Follow your diet and exercise programs as suggested by your caregiver.  Take medications as directed.  Have blood work done when your caregiver feels it is necessary. MAKE SURE YOU:   Understand these instructions.  Will watch your condition.  Will get help right away if you are not doing well or get worse. Document Released: 11/13/2000 Document Revised: 05/13/2011 Document Reviewed: 12/02/2012 [facility] Patient Information 2014 [place], [place].

## 2013-05-16 ENCOUNTER — Encounter: Payer: Self-pay | Admitting: Family Medicine

## 2013-05-16 DIAGNOSIS — K5790 Diverticulosis of intestine, part unspecified, without perforation or abscess without bleeding: Secondary | ICD-10-CM

## 2013-05-16 HISTORY — DX: Diverticulosis of intestine, part unspecified, without perforation or abscess without bleeding: K57.90

## 2013-05-16 NOTE — Assessment & Plan Note (Signed)
Well controlled, no changes to meds. Encouraged heart healthy diet such as the DASH diet and exercise as tolerated.  °

## 2013-05-16 NOTE — Progress Notes (Signed)
Patient ID: Taylor Vaughn, female   DOB: 14-Jul-1929, 80 y.o.   MRN: [id number] Diagnosis of Diverticulosis entered in error

## 2013-05-16 NOTE — Assessment & Plan Note (Signed)
No recent outbreaks. 

## 2013-05-16 NOTE — Assessment & Plan Note (Signed)
Improved greatly at present time. May continue meds prn

## 2013-05-16 NOTE — Assessment & Plan Note (Signed)
Encouraged heart healthy diet, increase exercise, avoid trans fats, consider a krill oil cap daily. Does not tolerate statins well.

## 2013-10-12 ENCOUNTER — Other Ambulatory Visit: Payer: Self-pay

## 2013-10-12 DIAGNOSIS — I10 Essential (primary) hypertension: Secondary | ICD-10-CM

## 2013-10-12 MED ORDER — METOPROLOL SUCCINATE ER 25 MG PO TB24: 25.0000 mg | ORAL_TABLET | Freq: Every day | ORAL | Status: DC

## 2013-11-15 ENCOUNTER — Encounter: Payer: Self-pay | Admitting: Family Medicine

## 2013-11-15 ENCOUNTER — Ambulatory Visit (INDEPENDENT_AMBULATORY_CARE_PROVIDER_SITE_OTHER): Payer: Medicare Other | Admitting: Family Medicine

## 2013-11-15 VITALS — BP 134/78 | HR 63 | Temp 98.2°F | Ht 63.25 in | Wt 164.8 lb

## 2013-11-15 DIAGNOSIS — E785 Hyperlipidemia, unspecified: Secondary | ICD-10-CM

## 2013-11-15 DIAGNOSIS — L259 Unspecified contact dermatitis, unspecified cause: Secondary | ICD-10-CM | POA: Diagnosis not present

## 2013-11-15 DIAGNOSIS — B372 Candidiasis of skin and nail: Secondary | ICD-10-CM

## 2013-11-15 DIAGNOSIS — Z0000 Encounter for general adult medical examination without abnormal findings: Secondary | ICD-10-CM

## 2013-11-15 DIAGNOSIS — I10 Essential (primary) hypertension: Secondary | ICD-10-CM | POA: Diagnosis not present

## 2013-11-15 DIAGNOSIS — L309 Dermatitis, unspecified: Secondary | ICD-10-CM

## 2013-11-15 DIAGNOSIS — F4321 Adjustment disorder with depressed mood: Secondary | ICD-10-CM

## 2013-11-15 DIAGNOSIS — R350 Frequency of micturition: Secondary | ICD-10-CM

## 2013-11-15 DIAGNOSIS — R351 Nocturia: Secondary | ICD-10-CM

## 2013-11-15 LAB — CBC
HEMATOCRIT: 44.5 % (ref 36.0–46.0)
Hemoglobin: 14.7 g/dL (ref 12.0–15.0)
MCHC: 33 g/dL (ref 30.0–36.0)
MCV: 95.5 fl (ref 78.0–100.0)
Platelets: 296 10*3/uL (ref 150.0–400.0)
RBC: 4.66 Mil/uL (ref 3.87–5.11)
RDW: 13.4 % (ref 11.5–15.5)
WBC: 6.9 10*3/uL (ref 4.0–10.5)

## 2013-11-15 LAB — RENAL FUNCTION PANEL
Albumin: 3.9 g/dL (ref 3.5–5.2)
BUN: 18 mg/dL (ref 6–23)
CO2: 24 mEq/L (ref 19–32)
Calcium: 10.2 mg/dL (ref 8.4–10.5)
Chloride: 106 mEq/L (ref 96–112)
Creatinine, Ser: 0.8 mg/dL (ref 0.4–1.2)
GFR: 68.9 mL/min (ref >60.00)
GLUCOSE: 94 mg/dL (ref 70–99)
PHOSPHORUS: 2.7 mg/dL (ref 2.3–4.6)
POTASSIUM: 4 meq/L (ref 3.5–5.1)
Sodium: 139 mEq/L (ref 135–145)

## 2013-11-15 LAB — HEPATIC FUNCTION PANEL
ALT: 21 U/L (ref 0–35)
AST: 21 U/L (ref 0–37)
Albumin: 3.9 g/dL (ref 3.5–5.2)
Alkaline Phosphatase: 50 U/L (ref 39–117)
BILIRUBIN DIRECT: 0.1 mg/dL (ref 0.0–0.3)
BILIRUBIN TOTAL: 0.6 mg/dL (ref 0.2–1.2)
Total Protein: 6.6 g/dL (ref 6.0–8.3)

## 2013-11-15 LAB — TSH: TSH: 2.94 u[IU]/mL (ref 0.35–4.50)

## 2013-11-15 LAB — URINALYSIS
BILIRUBIN URINE: NEGATIVE
Hgb urine dipstick: NEGATIVE
Ketones, ur: NEGATIVE
LEUKOCYTES UA: NEGATIVE
NITRITE: NEGATIVE
Specific Gravity, Urine: 1.02 (ref 1.000–1.030)
Total Protein, Urine: NEGATIVE
UROBILINOGEN UA: 0.2 (ref 0.0–1.0)
Urine Glucose: NEGATIVE
pH: 5.5 (ref 5.0–8.0)

## 2013-11-15 MED ORDER — ITRACONAZOLE 100 MG PO CAPS: 200.0000 mg | ORAL_CAPSULE | Freq: Every day | ORAL | Status: DC

## 2013-11-15 MED ORDER — NYSTATIN-TRIAMCINOLONE 100000-0.1 UNIT/GM-% EX OINT: 1.0000 "application " | TOPICAL_OINTMENT | Freq: Two times a day (BID) | CUTANEOUS | Status: DC

## 2013-11-15 NOTE — Assessment & Plan Note (Addendum)
Recurrent and persistent under right breast for several months OTC meds helps the itching  But does not resolve, responded to Sporanox in past, given refill but will try Mycolog cream bid first.

## 2013-11-15 NOTE — Assessment & Plan Note (Signed)
Still grieves the loss of her husband but is keeping busy and doing better.

## 2013-11-15 NOTE — Assessment & Plan Note (Signed)
Declines statins, will check levels and she may try krill oil caps, just restarted garlic a couple weeks ago

## 2013-11-15 NOTE — Patient Instructions (Signed)
Jobst compression hose, knee highs, 10-20 mm hg on in am off in pm Digestive Advantage probiotics by Schiff Consider krill oil  Daily by Schiff, called MegaRed    Edema Edema is an abnormal buildup of fluids in your bodytissues. Edema is somewhatdependent on gravity to pull the fluid to the lowest place in your body. That makes the condition more common in the legs and thighs (lower extremities). Painless swelling of the feet and ankles is common and becomes more likely as you get older. It is also common in looser tissues, like around your eyes.  When the affected area is squeezed, the fluid may move out of that spot and leave a dent for a few moments. This dent is called pitting.  CAUSES  There are many possible causes of edema. Eating too much salt and being on your feet or sitting for a long time can cause edema in your legs and ankles. Hot weather may make edema worse. Common medical causes of edema include:  Heart failure.  Liver disease.  Kidney disease.  Weak blood vessels in your legs.  Cancer.  An injury.  Pregnancy.  Some medications.  Obesity. SYMPTOMS  Edema is usually painless.Your skin may look swollen or shiny.  DIAGNOSIS  Your health care provider may be able to diagnose edema by asking about your medical history and doing a physical exam. You may need to have tests such as X-rays, an electrocardiogram, or blood tests to check for medical conditions that may cause edema.  TREATMENT  Edema treatment depends on the cause. If you have heart, liver, or kidney disease, you need the treatment appropriate for these conditions. General treatment may include:  Elevation of the affected body part above the level of your heart.  Compression of the affected body part. Pressure from elastic bandages or support stockings squeezes the tissues and forces fluid back into the blood vessels. This keeps fluid from entering the tissues.  Restriction of fluid and salt  intake.  Use of a water pill (diuretic). These medications are appropriate only for some types of edema. They pull fluid out of your body and make you urinate more often. This gets rid of fluid and reduces swelling, but diuretics can have side effects. Only use diuretics as directed by your health care provider. HOME CARE INSTRUCTIONS   Keep the affected body part above the level of your heart when you are lying down.   Do not sit still or stand for prolonged periods.   Do not put anything directly under your knees when lying down.  Do not wear constricting clothing or garters on your upper legs.   Exercise your legs to work the fluid back into your blood vessels. This may help the swelling go down.   Wear elastic bandages or support stockings to reduce ankle swelling as directed by your health care provider.   Eat a low-salt diet to reduce fluid if your health care provider recommends it.   Only take medicines as directed by your health care provider. SEEK MEDICAL CARE IF:   Your edema is not responding to treatment.  You have heart, liver, or kidney disease and notice symptoms of edema.  You have edema in your legs that does not improve after elevating them.   You have sudden and unexplained weight gain. SEEK IMMEDIATE MEDICAL CARE IF:   You develop shortness of breath or chest pain.   You cannot breathe when you lie down.  You develop pain, redness, or warmth  in the swollen areas.   You have heart, liver, or kidney disease and suddenly get edema.  You have a fever and your symptoms suddenly get worse. MAKE SURE YOU:   Understand these instructions.  Will watch your condition.  Will get help right away if you are not doing well or get worse. Document Released: 02/18/2005 Document Revised: 07/05/2013 Document Reviewed: 12/11/2012 [facility] Patient Information 2015 [place], [place]. This information is not intended to replace advice given to you by your health  care provider. Make sure you discuss any questions you have with your health care provider.

## 2013-11-15 NOTE — Assessment & Plan Note (Signed)
Was seen by urology and has chosen not to try medications

## 2013-11-15 NOTE — Progress Notes (Signed)
Pre visit review using our clinic review tool, if applicable. No additional management support is needed unless otherwise documented below in the visit note. 

## 2013-11-15 NOTE — Assessment & Plan Note (Signed)
Improved on recheck and c/w what she sees at home was coughing during first check

## 2013-11-15 NOTE — Progress Notes (Signed)
Patient ID: Taylor Vaughn, female   DOB: Jun 23, 1929, 80 y.o.   MRN: [id number] Taylor Vaughn [id number] 09-30-1929 11/15/2013      Progress Note-Follow Up  Subjective  Chief Complaint  Chief Complaint  Patient presents with  . Annual Exam    medicare wellness    HPI  Patient is a 80 year old female in today for routine medical care. She reports to be doing well. Struggles with recurrent dermatitis under bilateral breasts and had been doing better to 2 months ago when it returned. Was under the left breast this has resolved but his persistent under the right breast. It is itchy and irritated at times. Uses over-the-counter medications for itching but the rash is not resolved. No other recent illness or concerns. She continues to struggle with nocturia getting up as often as every hour but has been seen by urology and declined medications. Reports blood pressures at home ranging from 132-440 systolic. Acknowledges at times feeling excessively fatigued and does believe she still struggles with low-grade depression ever since the loss of her husband. Finds it manageable and stays busy most days. Denies CP/palp/SOB/HA/congestion/fevers/GI or GU c/o. Taking meds as prescribed  Past Medical History  Diagnosis Date  . Chicken pox as a child  . Measles 17  . Mumps as a child  . Whooping cough 7 yrs old  . Hypertension   . Hyperlipidemia   . Insomnia   . Colon polyps     adenomatous, recurrrent, due  again 12/13  . Urinary frequency 07/22/2011  . Acute chest wall pain 07/22/2011  . Preventative health care 07/22/2011  . Right shoulder strain 10/03/2011  . MRSA (methicillin resistant staph aureus) culture positive 10/03/2011  . Grief reaction 10/03/2011  . Candidal skin infection 10/03/2011  . Allergic rhinitis 11/22/2011  . Renal artery stenosis 11/24/2011  . Cervical cancer screening 03/19/2012  . Tinea corporis 11/15/2012    Under both breasts R>L, left foot.   . Cataract 01/11/2013  .  Diverticulosis 05/16/2013    Past Surgical History  Procedure Laterality Date  . Cholecystectomy  1992  . Skin biopsy      multiple, all benign    Family History  Problem Relation Age of Onset  . Cancer Mother 82    breast- remission, late 79's lung cancer  . Hypertension Mother     ?  Marland Kitchen Angina Father   . Diabetes Father     type 2  . Heart disease Father   . Obesity Sister   . Diabetes Sister     type 2  . Hyperlipidemia Sister   . Heart attack Sister   . Spina bifida Brother   . Constipation Daughter   . Hypertension Daughter   . Hyperlipidemia Daughter   . Obesity Son   . Hypertension Son   . Obesity Maternal Grandmother   . Heart attack Maternal Grandmother   . Stroke Paternal Grandfather     History   Social History  . Marital Status: Widowed    Spouse Name: N/A    Number of Children: N/A  . Years of Education: N/A   Occupational History  . Not on file.   Social History Main Topics  . Smoking status: Former Smoker    Types: Cigarettes  . Smokeless tobacco: Never Used     Comment: smoked for 3 years in 30's  . Alcohol Use: No  . Drug Use: No  . Sexual Activity: No   Other Topics Concern  . Not  on file   Social History Narrative  . No narrative on file    Current Outpatient Prescriptions on File Prior to Visit  Medication Sig Dispense Refill  . Calcium Carbonate-Vit D-Min (CALCIUM 1200 PO) Take 1 tablet by mouth 2 (two) times daily.      . clobetasol cream (TEMOVATE) 0.05 % Apply topically 2 (two) times daily. Start at bed time can increase to twice a day if still present  60 g  1  . co-enzyme Q-10 30 MG capsule Take 30 mg by mouth daily.      . diazepam (VALIUM) 5 MG tablet Take 0.5 tablets (2.5 mg total) by mouth 2 (two) times daily as needed for anxiety (sleep).  30 tablet  2  . fish oil-omega-3 fatty acids 1000 MG capsule Take 2 g by mouth daily.      Marland Kitchen GARLIC PO Take by mouth.      . IRON PO Take 18 mg by mouth daily.      . metoprolol  succinate (TOPROL-XL) 25 MG 24 hr tablet Take 1 tablet (25 mg total) by mouth daily.  90 tablet  0  . Multiple Vitamin (MULTIVITAMIN) tablet Take 1 tablet by mouth daily.      . niacin 500 MG tablet Take 2 tablets (1,000 mg total) by mouth daily with breakfast.      . NON FORMULARY 2-3 cloves of raw garlic daily      . OVER THE COUNTER MEDICATION Dark Cherry Capsules- 1 tab daily      . Probiotic Product ([place]) Take by mouth.      . sodium chloride (AYR) 0.65 % nasal spray Place 2 sprays into the nose as needed for congestion (twice daily as needed).  30 mL  2  . Soya Lecithin 1200 MG CAPS Take 2,400 mg by mouth daily.      . vitamin E 400 UNIT capsule Take 400 Units by mouth daily.       No current facility-administered medications on file prior to visit.    Allergies  Allergen Reactions  . Lisinopril Cough  . Statins     Muscle cramps, rashes, loss of hair  . Keflex [Cephalexin] Rash  . Losartan     phlegm    Review of Systems  Review of Systems  Constitutional: Negative for fever, chills and malaise/fatigue.  HENT: Negative for congestion, hearing loss and nosebleeds.   Eyes: Negative for discharge.  Respiratory: Negative for cough, sputum production, shortness of breath and wheezing.   Cardiovascular: Negative for chest pain, palpitations and leg swelling.  Gastrointestinal: Negative for heartburn, nausea, vomiting, abdominal pain, diarrhea, constipation and blood in stool.  Genitourinary: Negative for dysuria, urgency, frequency and hematuria.  Musculoskeletal: Negative for back pain, falls and myalgias.  Skin: Positive for itching and rash.       Under right breast  Neurological: Negative for dizziness, tremors, sensory change, focal weakness, loss of consciousness, weakness and headaches.  Endo/Heme/Allergies: Negative for polydipsia. Does not bruise/bleed easily.  Psychiatric/Behavioral: Negative for depression and suicidal ideas. The patient is not  nervous/anxious and does not have insomnia.     Objective  BP 182/73  Pulse 63  Temp(Src) 98.2 F (36.8 C) (Oral)  Ht 5' 3.25" (1.607 m)  Wt 164 lb 12.8 oz (74.753 kg)  BMI 28.95 kg/m2  SpO2 98%  Physical Exam  Physical Exam  Constitutional: She is oriented to person, place, and time and well-developed, well-nourished, and in no distress. No  distress.  HENT:  Head: Normocephalic and atraumatic.  Eyes: Conjunctivae are normal.  Neck: Neck supple. No thyromegaly present.  Cardiovascular: Normal rate, regular rhythm and normal heart sounds.   No murmur heard. Pulmonary/Chest: Effort normal and breath sounds normal. She has no wheezes.  Abdominal: She exhibits no distension and no mass.  Musculoskeletal: She exhibits no edema.  Lymphadenopathy:    She has no cervical adenopathy.  Neurological: She is alert and oriented to person, place, and time.  Skin: Skin is warm and dry. No rash noted. She is not diaphoretic.  Psychiatric: Memory, affect and judgment normal.    Lab Results  Component Value Date   TSH 4.113 05/05/2013   Lab Results  Component Value Date   WBC 6.5 05/05/2013   HGB 14.7 05/05/2013   HCT 43.3 05/05/2013   MCV 93.9 05/05/2013   PLT 352 05/05/2013   Lab Results  Component Value Date   CREATININE 0.73 05/05/2013   BUN 20 05/05/2013   NA 140 05/05/2013   K 4.6 05/05/2013   CL 106 05/05/2013   CO2 28 05/05/2013   Lab Results  Component Value Date   ALT 16 05/05/2013   AST 15 05/05/2013   ALKPHOS 43 05/05/2013   BILITOT 0.6 05/05/2013   Lab Results  Component Value Date   CHOL 267* 05/05/2013   Lab Results  Component Value Date   HDL 48 05/05/2013   Lab Results  Component Value Date   LDLCALC 194* 05/05/2013   Lab Results  Component Value Date   TRIG 126 05/05/2013   Lab Results  Component Value Date   CHOLHDL 5.6 05/05/2013     Assessment & Plan  Candidal skin infection Recurrent and persistent under right breast for several months OTC meds helps the itching  But  does not resolve, responded to Sporanox in past, given refill but will try Mycolog cream bid first.   Hypertension Improved on recheck and c/w what she sees at home was coughing during first check  Urinary frequency Was seen by urology and has chosen not to try medications  Hyperlipidemia Declines statins, will check levels and she may try krill oil caps, just restarted garlic a couple weeks ago  Grief reaction Still grieves the loss of her husband but is keeping busy and doing better.   Medicare annual wellness visit, subsequent Patient denies any difficulties at home. No trouble with ADLs, depression or falls. No recent changes to vision or hearing. Is UTD with immunizations. Is UTD with screening. Discussed Advanced Directives, patient agrees to bring [place] copies of documents if can. Encouraged heart healthy diet, exercise as tolerated and adequate sleep. Declines flu and tdap and prevnar. Follows with Dr Erlene Quan of GI, UTD with colonoscopy Follow with Dr Sallyanne Kuster of Cardiology Sees Dr Janice Norrie of [facility] 2014

## 2013-11-15 NOTE — Assessment & Plan Note (Signed)
Patient denies any difficulties at home. No trouble with ADLs, depression or falls. No recent changes to vision or hearing. Is UTD with immunizations. Is UTD with screening. Discussed Advanced Directives, patient agrees to bring [place] copies of documents if can. Encouraged heart healthy diet, exercise as tolerated and adequate sleep. Declines flu and tdap and prevnar. Follows with Dr Erlene Quan of GI, UTD with colonoscopy Follow with Dr Sallyanne Kuster of Cardiology Sees Dr Janice Norrie of [facility] 2014

## 2013-11-16 ENCOUNTER — Telehealth: Payer: Self-pay | Admitting: Family Medicine

## 2013-11-16 ENCOUNTER — Telehealth: Payer: Self-pay

## 2013-11-16 DIAGNOSIS — L309 Dermatitis, unspecified: Secondary | ICD-10-CM

## 2013-11-16 LAB — URINE CULTURE
Colony Count: NO GROWTH
Organism ID, Bacteria: NO GROWTH

## 2013-11-16 MED ORDER — NYSTATIN-TRIAMCINOLONE 100000-0.1 UNIT/GM-% EX OINT: 1.0000 "application " | TOPICAL_OINTMENT | Freq: Two times a day (BID) | CUTANEOUS | Status: DC

## 2013-11-16 NOTE — Telephone Encounter (Signed)
Try triamcinolone 0.1% cream apply bid prn to affected area. Disp #1 tube, 2 rf

## 2013-11-16 NOTE — Telephone Encounter (Signed)
Caller name: Mercia Relation to XA:JOIN Call back number: [phone] Pharmacy[facility] [address] across from [address] Reason for call:   Patient states that nystatin through the mail order is too expensive for her. She would like a new rx of this medication [facility] to [facility].

## 2013-11-16 NOTE — Telephone Encounter (Signed)
Nerida self [phone] Domingo Sep called and said that the cream was 227.00 and she could not afford that, could you call in something less expensive that would work.

## 2013-11-17 ENCOUNTER — Telehealth: Payer: Self-pay | Admitting: Family Medicine

## 2013-11-17 DIAGNOSIS — L309 Dermatitis, unspecified: Secondary | ICD-10-CM

## 2013-11-17 MED ORDER — TRIAMCINOLONE ACETONIDE 0.1 % EX CREA: 1.0000 "application " | TOPICAL_CREAM | Freq: Two times a day (BID) | CUTANEOUS | Status: DC | PRN

## 2013-11-17 NOTE — Telephone Encounter (Signed)
rx sent

## 2013-11-17 NOTE — Telephone Encounter (Signed)
Pt informed that new rx was sent to [facility]

## 2013-11-17 NOTE — Telephone Encounter (Signed)
°  Caller name: Lennette Bihari  Relation to pt: other [place]  Call back number:  [phone]   Reason for call:     Pharmacy needs clarification did you mean to send this rx to the mail order or retail pharmacy and requesting the intials of the person who faxed rx itraconazole (SPORANOX) 100 MG capsule

## 2013-11-18 MED ORDER — ITRACONAZOLE 100 MG PO CAPS: 200.0000 mg | ORAL_CAPSULE | Freq: Every day | ORAL | Status: DC

## 2013-11-18 NOTE — Telephone Encounter (Signed)
Sent a fax stating md sent to mail order on accident

## 2013-11-22 ENCOUNTER — Telehealth: Payer: Self-pay | Admitting: Family Medicine

## 2013-11-22 NOTE — Telephone Encounter (Signed)
Results mailed per patient request.

## 2013-11-22 NOTE — Telephone Encounter (Signed)
Caller name: Liller  Relation to pt: self  Call back number: [phone]   Reason for call: pt would like blood work results taken on 11/15/2013. Please mail results to home address

## 2013-12-02 DIAGNOSIS — H9113 Presbycusis, bilateral: Secondary | ICD-10-CM

## 2013-12-02 HISTORY — DX: Presbycusis, bilateral: H91.13

## 2013-12-03 ENCOUNTER — Encounter: Payer: Self-pay | Admitting: Family Medicine

## 2013-12-03 ENCOUNTER — Ambulatory Visit (INDEPENDENT_AMBULATORY_CARE_PROVIDER_SITE_OTHER): Payer: Medicare Other | Admitting: Family Medicine

## 2013-12-03 VITALS — BP 141/82 | HR 62 | Temp 98.0°F | Resp 18 | Ht 63.25 in | Wt 161.0 lb

## 2013-12-03 DIAGNOSIS — Z87898 Personal history of other specified conditions: Secondary | ICD-10-CM

## 2013-12-03 DIAGNOSIS — H9312 Tinnitus, left ear: Secondary | ICD-10-CM

## 2013-12-03 DIAGNOSIS — R42 Dizziness and giddiness: Secondary | ICD-10-CM

## 2013-12-03 DIAGNOSIS — Z8669 Personal history of other diseases of the nervous system and sense organs: Secondary | ICD-10-CM

## 2013-12-03 NOTE — Progress Notes (Signed)
Pre visit review using our clinic review tool, if applicable. No additional management support is needed unless otherwise documented below in the visit note. 

## 2013-12-03 NOTE — Progress Notes (Signed)
OFFICE NOTE  12/03/2013  CC:  Chief Complaint  Patient presents with  . Tinnitus    yesterday am, Left sided only   HPI: Patient is a 80 y.o. Caucasian female who is here for "there's a bee in my ear". Sudden onset of very loud buzzing in left ear yesterday morning while standing up in her bathroom. No vertigo sensation but says she had to hold onto her sink to stay upright--some vague sense of disequilibrium.   Sometimes the buzzing lasts a 30-45 seconds, sometimes 2-3 minutes at max, sometimes intensity is very slight sometimes very loud.  Her sense of balance is only upset when it "hits me really loud".  She walks with a cane since yesterday as a precaution but says that as long as she doesn't have loud buzzing then she feels like she can walk just fine--no veering/ataxia.   Says she has hx of BPPV and has had buzzing with it but it was never this intense.  This was 07/2013, dx'd here at this office when seen by our FNP.  She went home and did Epley's maneuvers for about 1 day and says it all went away and she had no problems until yesterday morning.  Currently, in the exam room now she hears a tiny amount of buzzing in left ear but no balance symptoms.  Denies any sense of acute hearing deficit. No recent new OTC or Rx meds. She has not tried any meds for this condition.  Pertinent PMH:  Past medical, surgical, social, and family history reviewed and no changes are noted since last office visit.  MEDS:  Outpatient Prescriptions Prior to Visit  Medication Sig Dispense Refill  . Calcium Carbonate-Vit D-Min (CALCIUM 1200 PO) Take 1 tablet by mouth 2 (two) times daily.      Marland Kitchen co-enzyme Q-10 30 MG capsule Take 30 mg by mouth daily.      . diazepam (VALIUM) 5 MG tablet Take 0.5 tablets (2.5 mg total) by mouth 2 (two) times daily as needed for anxiety (sleep).  30 tablet  2  . fish oil-omega-3 fatty acids 1000 MG capsule Take 2 g by mouth daily.      Marland Kitchen GARLIC PO Take by mouth.      . IRON  PO Take 18 mg by mouth daily.      . metoprolol succinate (TOPROL-XL) 25 MG 24 hr tablet Take 1 tablet (25 mg total) by mouth daily.  90 tablet  0  . Multiple Vitamin (MULTIVITAMIN) tablet Take 1 tablet by mouth daily.      . niacin 500 MG tablet Take 2 tablets (1,000 mg total) by mouth daily with breakfast.      . NON FORMULARY 2-3 cloves of raw garlic daily      . OVER THE COUNTER MEDICATION Dark Cherry Capsules- 1 tab daily      . OVER THE COUNTER MEDICATION Bio X4- daily      . Probiotic Product ([place]) Take by mouth.      . sodium chloride (AYR) 0.65 % nasal spray Place 2 sprays into the nose as needed for congestion (twice daily as needed).  30 mL  2  . Soya Lecithin 1200 MG CAPS Take 2,400 mg by mouth daily.      Marland Kitchen triamcinolone cream (KENALOG) 0.1 % Apply 1 application topically 2 (two) times daily as needed.  30 g  2  . vitamin E 400 UNIT capsule Take 400 Units by mouth daily.      Marland Kitchen  clobetasol cream (TEMOVATE) 0.05 % Apply topically 2 (two) times daily. Start at bed time can increase to twice a day if still present  60 g  1  . itraconazole (SPORANOX) 100 MG capsule Take 2 capsules (200 mg total) by mouth daily.  14 capsule  0  . nystatin-triamcinolone ointment (MYCOLOG) Apply 1 application topically 2 (two) times daily.  60 g  2   No facility-administered medications prior to visit.    PE: Blood pressure 141/82, pulse 62, temperature 98 F (36.7 C), temperature source Temporal, resp. rate 18, height 5' 3.25" (1.607 m), weight 161 lb (73.029 kg), SpO2 98.00%. Gen: Alert, well appearing.  Patient is oriented to person, place, time, and situation. AFFECT: pleasant, lucid thought and speech. ENT: Ears: EACs clear, normal epithelium.  TMs with good light reflex and landmarks bilaterally.  Eyes: no injection, icteris, swelling, or exudate.  EOMI, PERRLA.  No nystagmus. Nose: no drainage or turbinate edema/swelling.  No injection or focal lesion.  Mouth: lips without  lesion/swelling.  Oral mucosa pink and moist.  Dentition intact and without obvious caries or gingival swelling.  Oropharynx without erythema, exudate, or swelling.  Neck: supple/nontender.  No LAD, mass, or TM.  Carotid pulses 2+ bilaterally, without bruits. CV: RRR, no m/r/g.   LUNGS: CTA bilat, nonlabored resps, good aeration in all lung fields. EXT: no clubbing, cyanosis, or edema.  Neuro: CN 2-12 intact bilaterally, strength 5/5 in proximal and distal upper extremities and lower extremities bilaterally.   No tremor.  FNF well coordinated bilaterally.  No ataxia.  Upper extremity DTRs symmetric.  LE DTRs: Patellar reflex 2+ on left and 1+ on right, achilles trace bilat.   No pronator drift. Dix-Halpike maneuver negative bilaterally.     Hearing Screening   125Hz  250Hz  500Hz  1000Hz  2000Hz  4000Hz  8000Hz   Right ear:   15 15 20 20    Left ear:   15 15 20 30     IMPRESSION AND PLAN:  Left sided buzzing-type tinnitus with disequilibrium. Relatively recent history of BPPV.  I told patient I don't see any red flags to indicate the need to search for intracranial pathology at this time. Reassured her.  She asked if she could try home Epley's positioning maneuvers to see if they help and I said that would be ok to try.  I offered ENT referral and she accepted, but wants to wait until after an upcoming vacation/trip. Referral to Dr. Erik Obey ordered today.  An After Visit Summary was printed and given to the patient.  FOLLOW UP: prn

## 2013-12-06 ENCOUNTER — Telehealth: Payer: Self-pay | Admitting: Family Medicine

## 2013-12-06 ENCOUNTER — Other Ambulatory Visit: Payer: Self-pay | Admitting: Family Medicine

## 2013-12-06 DIAGNOSIS — I10 Essential (primary) hypertension: Secondary | ICD-10-CM

## 2013-12-06 DIAGNOSIS — I701 Atherosclerosis of renal artery: Secondary | ICD-10-CM

## 2013-12-06 NOTE — Telephone Encounter (Signed)
Pt LMOM stating that she remembered years ago having the tinnitus in her ears and it was caused by right renal artery stenosis.  Patient states she would like to get a dopler u/s before she leaves for [place] on Thursday.  Please advise.

## 2013-12-06 NOTE — Telephone Encounter (Signed)
I recommended pt take 2 of her 25mg  Toprol XL qd as long as HR was >70. I also ordered a renal artery doppler U/s to be done ASAP to f/u her hx of bilat RAS. This information was communicated to pt by CMA Jacklynn Ganong today.  Pt was also instructed to continue home bp/hr monitoring.

## 2013-12-06 NOTE — Telephone Encounter (Signed)
Patient states that she called [facility] Saturday and reported that her BP was running over 200 and then at bedtime it was 163.  Please advise.

## 2013-12-08 ENCOUNTER — Telehealth: Payer: Self-pay | Admitting: Family Medicine

## 2013-12-08 ENCOUNTER — Ambulatory Visit (HOSPITAL_COMMUNITY)
Admission: RE | Admit: 2013-12-08 | Discharge: 2013-12-08 | Disposition: A | Discharge status: Home / Self Care | Payer: Medicare Other | Source: Ambulatory Visit | Attending: Family Medicine | Admitting: Family Medicine

## 2013-12-08 ENCOUNTER — Other Ambulatory Visit (HOSPITAL_COMMUNITY): Payer: Self-pay | Admitting: Family Medicine

## 2013-12-08 DIAGNOSIS — I159 Secondary hypertension, unspecified: Secondary | ICD-10-CM

## 2013-12-08 DIAGNOSIS — I701 Atherosclerosis of renal artery: Secondary | ICD-10-CM

## 2013-12-08 DIAGNOSIS — I10 Essential (primary) hypertension: Secondary | ICD-10-CM

## 2013-12-08 MED ORDER — AMLODIPINE BESYLATE 5 MG PO TABS: 5.0000 mg | ORAL_TABLET | Freq: Every day | ORAL | Status: DC

## 2013-12-08 NOTE — Progress Notes (Signed)
*  PRELIMINARY RESULTS* Vascular Ultrasound Renal Artery Duplex has been completed.  Findings suggest bilateral >60% renal artery stenosis.   Preliminary results discussed with Dr. Anitra Lauth.  12/08/2013 10:20 AM Maudry Mayhew, RVT, RDCS, RDMS

## 2013-12-08 NOTE — Telephone Encounter (Signed)
Discussed results of renal artery dopplers on phone with pt today: right RAS essentially unchanged (>60% proximal) and she now also has NEW left RAS >60% at the origin of the left renal artery (compared to September 2012 renal artery dopplers). Her home bps last couple of days have been mostly upper normal, some stage I HTN, occ Stage II systolic HTN, HR usually high 60s.  She is going to fly to [place] to see her sister tomorrow. We'll arrange cardiology referral for when she returns (her former cardiologist, Dr. Rollene Fare, retired) and I will keep her on 25mg  Toprol XL qd and add amlodipine 5mg  qd today. She'll call the morning of 12/15/13 to see when her cardiology appt has been set for.

## 2013-12-09 ENCOUNTER — Telehealth: Payer: Self-pay | Admitting: Family Medicine

## 2013-12-09 NOTE — Telephone Encounter (Signed)
emmi emailed °

## 2013-12-15 ENCOUNTER — Telehealth: Payer: Self-pay | Admitting: Family Medicine

## 2013-12-15 NOTE — Telephone Encounter (Signed)
Noted  

## 2013-12-15 NOTE — Telephone Encounter (Signed)
Taylor Vaughn stopped by the office to let Dr. Anitra Lauth know that her BP meds are doing ok and she is feeling fine.[place]

## 2013-12-15 NOTE — Telephone Encounter (Signed)
FYI

## 2013-12-20 ENCOUNTER — Encounter: Payer: Self-pay | Admitting: Family Medicine

## 2013-12-22 ENCOUNTER — Other Ambulatory Visit: Payer: Self-pay | Admitting: *Deleted

## 2013-12-22 DIAGNOSIS — I10 Essential (primary) hypertension: Secondary | ICD-10-CM

## 2013-12-22 MED ORDER — METOPROLOL SUCCINATE ER 25 MG PO TB24: 25.0000 mg | ORAL_TABLET | Freq: Every day | ORAL | Status: DC

## 2013-12-22 MED ORDER — AMLODIPINE BESYLATE 5 MG PO TABS: 5.0000 mg | ORAL_TABLET | Freq: Every day | ORAL | Status: DC

## 2013-12-22 NOTE — Telephone Encounter (Signed)
Patient left vm requesting amlodipine and metoprolol be sent to [facility] mail order with 90 days supply.

## 2014-01-04 ENCOUNTER — Encounter: Payer: Self-pay | Admitting: *Deleted

## 2014-01-06 ENCOUNTER — Encounter: Payer: Self-pay | Admitting: Cardiology

## 2014-01-06 ENCOUNTER — Ambulatory Visit (INDEPENDENT_AMBULATORY_CARE_PROVIDER_SITE_OTHER): Payer: Medicare Other | Admitting: Cardiology

## 2014-01-06 VITALS — BP 144/90 | HR 63 | Ht 63.25 in | Wt 163.0 lb

## 2014-01-06 DIAGNOSIS — R0789 Other chest pain: Secondary | ICD-10-CM

## 2014-01-06 DIAGNOSIS — I701 Atherosclerosis of renal artery: Secondary | ICD-10-CM

## 2014-01-06 NOTE — Progress Notes (Signed)
Patient ID: Taylor Vaughn, female   DOB: [birth year], 80 y.o.   MRN: [id number]     Patient Name: Taylor Vaughn Date of Encounter: 01/06/2014  Primary Care Provider:  Penni Homans, MD Primary Cardiologist:  Dorothy Spark   Problem List   Past Medical History  Diagnosis Date  . Chicken pox as a child  . Measles 17  . Mumps as a child  . Whooping cough 7 yrs old  . Hypertension   . Hyperlipidemia   . Insomnia   . Colon polyps     adenomatous, recurrrent, due  again 12/13  . Urinary frequency 07/22/2011  . Acute chest wall pain 07/22/2011  . Preventative health care 07/22/2011  . Right shoulder strain 10/03/2011  . MRSA (methicillin resistant staph aureus) culture positive 10/03/2011  . Grief reaction 10/03/2011  . Candidal skin infection 10/03/2011  . Allergic rhinitis 11/22/2011  . Renal artery stenosis 11/24/2011    bilat, worse 12/2013  . Cervical cancer screening 03/19/2012  . Tinea corporis 11/15/2012    Under both breasts R>L, left foot.   . Cataract 01/11/2013  . Diverticulosis 05/16/2013  . Medicare annual wellness visit, subsequent 07/22/2011    Follows with Dr Erlene Quan of GI Follow with Dr Sallyanne Kuster of Cardiology   . Presbycusis of both ears 12/2013    with tinnitus of both ears (Dr. Erik Obey)   Past Surgical History  Procedure Laterality Date  . Cholecystectomy  1992  . Skin biopsy      multiple, all benign    Allergies  Allergies  Allergen Reactions  . Lisinopril Cough  . Statins     Muscle cramps, rashes, loss of hair  . Keflex [Cephalexin] Rash  . 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 Few years ago she developed headaches and pressure like in her head when she was first time diagnosed with HTN. At that time only right renal artery had significant stenosis. Over few years she now has B/L renal artery stenosis and BP that  can be low or > 200. No h/o TIA or stroke. No orthopnea, PND. No significant DOE, no chest pain or syncope.   Home Medications  Prior to Admission medications   Medication Sig Start Date End Date Taking? Authorizing Provider  amLODipine (NORVASC) 5 MG tablet Take 1 tablet (5 mg total) by mouth daily. 12/22/13  Yes Mosie Lukes, MD  Biotin 2500 MCG CAPS Take 1 tablet by mouth daily.   Yes Historical Provider, MD  Fish Oil-Coenzyme Q10 (FISH OIL PLUS CO Q-10) 1000-30 MG CAPS Take 1 tablet by mouth daily.   Yes Historical Provider, MD  metoprolol succinate (TOPROL-XL) 25 MG 24 hr tablet Take 1 tablet (25 mg total) by mouth daily. 12/22/13  Yes Mosie Lukes, MD  Misc Natural Products (TART CHERRY ADVANCED PO) Take 1,000 mg by mouth daily.   Yes Historical Provider, MD  Multiple Vitamins-Minerals (CENTRUM SILVER ADULT 50+ PO) Take 1 tablet by mouth daily.   Yes Historical Provider, MD  niacin 500 MG tablet Take 2 tablets (1,000 mg total) by mouth daily with breakfast. 07/08/12  Yes Mosie Lukes, MD  OVER THE COUNTER MEDICATION Take 1 tablet by mouth 3 (three) times daily. Bio X4   Yes Historical Provider, MD  Soya Lecithin 1200 MG CAPS Take 1,200 mg by mouth daily.  Yes Historical Provider, MD  vitamin E 400 UNIT capsule Take 400 Units by mouth daily.   Yes Historical Provider, MD    Family History  Family History  Problem Relation Age of Onset  . Breast cancer Mother 90    remission, late 60's lung cancer  . Hypertension Mother     ?  Marland Kitchen Angina Father   . Diabetes type II Father   . Heart disease Father   . Obesity Sister   . Diabetes type II Sister   . Hyperlipidemia Sister   . Heart attack Sister   . Spina bifida Brother   . Constipation Daughter   . Hypertension Daughter   . Hyperlipidemia Daughter   . Obesity Son   . Hypertension Son   . Obesity Maternal Grandmother   . Heart attack Maternal Grandmother   . Stroke Paternal Grandfather   . Lung cancer Mother      Social History  History   Social History  . Marital Status: Widowed    Spouse Name: N/A    Number of Children: N/A  . Years of Education: N/A   Occupational History  . Not on file.   Social History Main Topics  . Smoking status: Former Smoker    Types: Cigarettes  . Smokeless tobacco: Never Used     Comment: smoked for 3 years in 30's  . Alcohol Use: No  . Drug Use: No  . Sexual Activity: No   Other Topics Concern  . Not on file   Social History Narrative     Review of Systems, as per HPI, otherwise negative General:  No chills, fever, night sweats or weight changes.  Cardiovascular:  No chest pain, dyspnea on exertion, edema, orthopnea, palpitations, paroxysmal nocturnal dyspnea. Dermatological: No rash, lesions/masses Respiratory: No cough, dyspnea Urologic: No hematuria, dysuria Abdominal:   No nausea, vomiting, diarrhea, bright red blood per rectum, melena, or hematemesis Neurologic:  No visual changes, wkns, changes in mental status. All other systems reviewed and are otherwise negative except as noted above.  Physical Exam  Blood pressure 144/90, pulse 63, height 5' 3.25" (1.607 m), weight 163 lb (73.936 kg).  General: Pleasant, NAD Psych: Normal affect. Neuro: Alert and oriented X 3. Moves all extremities spontaneously. HEENT: Normal  Neck: Supple without bruits or JVD. Lungs:  Resp regular and unlabored, CTA. Heart: RRR no s3, s4, or murmurs. Abdomen: Soft, non-tender, non-distended, BS + x 4.  Extremities: No clubbing, cyanosis or edema. DP/PT/Radials 2+ and equal bilaterally.  Labs:  No results for input(s): CKTOTAL, CKMB, TROPONINI in the last 72 hours. Lab Results  Component Value Date   WBC 6.9 11/15/2013   HGB 14.7 11/15/2013   HCT 44.5 11/15/2013   MCV 95.5 11/15/2013   PLT 296.0 11/15/2013    No results found for: DDIMER Invalid input(s): POCBNP    Component Value Date/Time   NA 139 11/15/2013 1106   K 4.0 11/15/2013 1106   CL  106 11/15/2013 1106   CO2 24 11/15/2013 1106   GLUCOSE 94 11/15/2013 1106   BUN 18 11/15/2013 1106   CREATININE 0.8 11/15/2013 1106   CREATININE 0.73 05/05/2013 1135   CALCIUM 10.2 11/15/2013 1106   PROT 6.6 11/15/2013 1106   ALBUMIN 3.9 11/15/2013 1106   ALBUMIN 3.9 11/15/2013 1106   AST 21 11/15/2013 1106   ALT 21 11/15/2013 1106   ALKPHOS 50 11/15/2013 1106   BILITOT 0.6 11/15/2013 1106   GFRNONAA >60 04/13/2009 1847   GFRAA  04/13/2009 1847    >60        The eGFR has been calculated using the MDRD equation. This calculation has not been validated in all clinical situations. eGFR's persistently <60 mL/min signify possible Chronic Kidney Disease.   Lab Results  Component Value Date   CHOL 267* 05/05/2013   HDL 48 05/05/2013   LDLCALC 194* 05/05/2013   TRIG 126 05/05/2013    Accessory Clinical Findings  Echocardiogram - none  ECG - SR< normal ECG    Assessment & Plan  80 year old female with   1. B/L renal artery stenosis > 60% and labile blood pressure.  We will refer to Dr Gwenlyn Found.  2. HTN - We won't add any antihypertensives as her BP can be low at times.   3. Hyperlipidemia - the patient refuses to take any statins as she had significant side effects to multiple of them, also to red yeast rice. On fish oil and niacin.  4. Preventive cardiology - she is active, no significant DOE or chest pain on exertion. Normal ECG.   Dorothy Spark, MD, [facility] 01/06/2014, 12:16 PM

## 2014-01-06 NOTE — Patient Instructions (Addendum)
Your physician recommends that you continue on your current medications as directed. Please refer to the Current Medication list given to you today.   You have been referred to DR BERRY AT [place]

## 2014-01-11 ENCOUNTER — Telehealth: Payer: Self-pay | Admitting: Family Medicine

## 2014-01-11 NOTE — Telephone Encounter (Signed)
So we can resubmit, please confirm that it is correct that her visit was too soon then let coding decide but it looks like a [id number] otherwise. Please let patient know

## 2014-01-11 NOTE — Telephone Encounter (Signed)
Caller name: Alisson Relation to pt: self Call back number: [phone] Pharmacy:  Reason for call:   Patient states that medicare is telling her that the visit in September will not be covered because it was too early. She wants to know if we could file this with a different code so insurance will cover.

## 2014-01-11 NOTE — Telephone Encounter (Signed)
Please advise 

## 2014-03-09 ENCOUNTER — Ambulatory Visit (INDEPENDENT_AMBULATORY_CARE_PROVIDER_SITE_OTHER): Payer: Medicare Other | Admitting: Cardiovascular Disease

## 2014-03-09 ENCOUNTER — Encounter: Payer: Self-pay | Admitting: Cardiovascular Disease

## 2014-03-09 VITALS — BP 130/72 | HR 72 | Ht 63.75 in | Wt 165.1 lb

## 2014-03-09 DIAGNOSIS — I150 Renovascular hypertension: Secondary | ICD-10-CM

## 2014-03-09 DIAGNOSIS — E785 Hyperlipidemia, unspecified: Secondary | ICD-10-CM

## 2014-03-09 DIAGNOSIS — I701 Atherosclerosis of renal artery: Secondary | ICD-10-CM

## 2014-03-09 NOTE — Assessment & Plan Note (Signed)
Patient has a long history of hypertension on 2 antihypertensive medications well controlled. Her blood pressure today is 130/72. She does have moderate right renal artery stenosis dating back at least 5 years by duplex ultrasound however this has not changed. At this point, there is no clinical indication to perform renal artery stenting.

## 2014-03-09 NOTE — Assessment & Plan Note (Signed)
Taylor Vaughn was referred to me by Dr. Meda Coffee for evaluation treatment of right renal artery stenosis. She has had this documented by duplex ultrasound since August 2011 when her right renal aortic ratio was 4.4. She is on minimal medications including low-dose amlodipine and metoprolol with controlled blood pressures. At this point, there is no indication to perform renal vascular stenting. We will continue to follow her noninvasively.

## 2014-03-09 NOTE — Patient Instructions (Signed)
Renal Artery Doppler (in 12 months) - During this test, an ultrasound is used to evaluate blood flow to the kidneys. Allow one hour for this exam. Do not eat after midnight the day before and avoid carbonated beverages. Take your medications as you usually do.  Your physician wants you to follow-up in 1 year with Dr. Gwenlyn Found. You will receive a reminder letter in the mail 2 months in advance. If you do not receive a letter, please call our office to schedule the follow-up appointment.

## 2014-03-09 NOTE — Assessment & Plan Note (Signed)
History of hyperlipidemia though she is statin intolerant. She refuses to go on a statin drug.

## 2014-03-09 NOTE — Progress Notes (Signed)
03/09/2014 [name]   Jul 30, 1929  [id number]  Primary Physician Tammi Sou, MD Primary Cardiologist: Lorretta Harp MD Renae Gloss   HPI:  Taylor Vaughn is a 81 year old moderately overweight widowed Caucasian female mother of 3 children, grandmother and 6 grandchildren who has a PhD in [place] and works as a Investment banker, operational. Her primary care physician is Dr. Ruby Cola. She was referred by Dr. Ena Dawley, cardiologist, for peripheral vascular evaluation. 2 evaluate her renal artery for potential intervention. Her cardiovascular risk factor profile is notable for hypertension and hyperlipidemia. She is not diabetic. She's never had a heart attack or stroke. She denies chest pain or shortness of breath. She had renal Dopplers done in 2011 here in our practice that showed moderate right renal artery stenosis with a renal aortic aortic ratio of 4.4. This had not changed on serial Dopplers after that. Her blood pressure seems to be well-controlled on 2 medications.   Current Outpatient Prescriptions  Medication Sig Dispense Refill  . amLODipine (NORVASC) 5 MG tablet Take 1 tablet (5 mg total) by mouth daily. 90 tablet 1  . Biotin 2500 MCG CAPS Take 1 tablet by mouth daily.    . Fish Oil-Coenzyme Q10 (FISH OIL PLUS CO Q-10) 1000-30 MG CAPS Take 1 tablet by mouth daily.    . metoprolol succinate (TOPROL-XL) 25 MG 24 hr tablet Take 1 tablet (25 mg total) by mouth daily. 90 tablet 1  . Misc Natural Products (TART CHERRY ADVANCED PO) Take 1,000 mg by mouth daily.    . Multiple Vitamins-Minerals (CENTRUM SILVER ADULT 50+ PO) Take 1 tablet by mouth daily.    . niacin 500 MG tablet Take 2 tablets (1,000 mg total) by mouth daily with breakfast.    . OVER THE COUNTER MEDICATION Take 1 tablet by mouth 3 (three) times daily. Bio X4    . Soya Lecithin 1200 MG CAPS Take 1,200 mg by mouth daily.     . vitamin E 400 UNIT capsule Take 400 Units by mouth  daily.     No current facility-administered medications for this visit.    Allergies  Allergen Reactions  . Lisinopril Cough  . Statins     Muscle cramps, rashes, loss of hair  . Keflex [Cephalexin] Rash  . Losartan     phlegm    History   Social History  . Marital Status: Widowed    Spouse Name: N/A    Number of Children: N/A  . Years of Education: N/A   Occupational History  . Not on file.   Social History Main Topics  . Smoking status: Former Smoker    Types: Cigarettes  . Smokeless tobacco: Never Used     Comment: smoked for 3 years in 30's  . Alcohol Use: No  . Drug Use: No  . Sexual Activity: No   Other Topics Concern  . Not on file   Social History Narrative     Review of Systems: General: negative for chills, fever, night sweats or weight changes.  Cardiovascular: negative for chest pain, dyspnea on exertion, edema, orthopnea, palpitations, paroxysmal nocturnal dyspnea or shortness of breath Dermatological: negative for rash Respiratory: negative for cough or wheezing Urologic: negative for hematuria Abdominal: negative for nausea, vomiting, diarrhea, bright red blood per rectum, melena, or hematemesis Neurologic: negative for visual changes, syncope, or dizziness All other systems reviewed and are otherwise negative except as noted above.    Blood pressure 130/72, pulse 72, height 5' 3.75" (  1.619 m), weight 165 lb 1.6 oz (74.889 kg).  General appearance: alert and no distress Neck: no adenopathy, no carotid bruit, no JVD, supple, symmetrical, trachea midline and thyroid not enlarged, symmetric, no tenderness/mass/nodules Lungs: clear to auscultation bilaterally Heart: regular rate and rhythm, S1, S2 normal, no murmur, click, rub or gallop Extremities: extremities normal, atraumatic, no cyanosis or edema and 2+ pedal pulses bilaterally  EKG not performed today  ASSESSMENT AND PLAN:   Renal artery stenosis Taylor Vaughn was referred to me by  Dr. Meda Coffee for evaluation treatment of right renal artery stenosis. She has had this documented by duplex ultrasound since August 2011 when her right renal aortic ratio was 4.4. She is on minimal medications including low-dose amlodipine and metoprolol with controlled blood pressures. At this point, there is no indication to perform renal vascular stenting. We will continue to follow her noninvasively.  Hypertension Patient has a long history of hypertension on 2 antihypertensive medications well controlled. Her blood pressure today is 130/72. She does have moderate right renal artery stenosis dating back at least 5 years by duplex ultrasound however this has not changed. At this point, there is no clinical indication to perform renal artery stenting.  Hyperlipidemia History of hyperlipidemia though she is statin intolerant. She refuses to go on a statin drug.      Lorretta Harp MD FACP,FACC,FAHA, [facility] 03/09/2014 12:13 PM

## 2014-03-11 ENCOUNTER — Encounter: Payer: Self-pay | Admitting: Family Medicine

## 2014-04-05 ENCOUNTER — Encounter: Payer: Self-pay | Admitting: Family Medicine

## 2014-04-29 ENCOUNTER — Encounter: Payer: Self-pay | Admitting: Family Medicine

## 2014-05-12 ENCOUNTER — Encounter: Payer: Self-pay | Admitting: Family Medicine

## 2014-05-23 ENCOUNTER — Ambulatory Visit: Payer: Medicare Other | Admitting: Family Medicine

## 2014-07-07 ENCOUNTER — Other Ambulatory Visit: Payer: Self-pay | Admitting: *Deleted

## 2014-07-07 DIAGNOSIS — I10 Essential (primary) hypertension: Secondary | ICD-10-CM

## 2014-07-07 MED ORDER — AMLODIPINE BESYLATE 5 MG PO TABS: 5.0000 mg | ORAL_TABLET | Freq: Every day | ORAL | Status: DC

## 2014-07-07 MED ORDER — METOPROLOL SUCCINATE ER 25 MG PO TB24: 25.0000 mg | ORAL_TABLET | Freq: Every day | ORAL | Status: DC

## 2014-07-07 NOTE — Telephone Encounter (Signed)
Pt LMOM requesting refill for 90 day supply to be sent to [facility] order. LOV was 12/03/13, last refilled was 12/22/13. Pt transferred from Dr. Charlett Blake. Has no up coming apt. Please advise. Thanks.

## 2014-08-25 ENCOUNTER — Encounter: Payer: Self-pay | Admitting: *Deleted

## 2014-09-21 ENCOUNTER — Encounter: Payer: Self-pay | Admitting: Cardiovascular Disease

## 2014-10-10 ENCOUNTER — Telehealth: Payer: Self-pay | Admitting: Family Medicine

## 2014-10-10 NOTE — Telephone Encounter (Signed)
OK to change billing if appropriate to cover problems vs. Physical. Please help

## 2014-10-10 NOTE — Telephone Encounter (Signed)
Caller name:Sabas, Barnett Applebaum Relation to LP:FXTK Call back [place]:  Reason for call: pt was a previous pt of dr. Charlett Blake, pt had a CPE sometime in March of 2015, and dr. Charlett Blake informed her to come back 6 months later, pt states her medicare paid for the first CPE back in march however they denied the claim for the September visit, pt states she spoke with Alyse Low who informed her that dr. b would correct the dx code and processed again. Medicare denied it again, pt states she just received a bill for $275, pt states she has spoken with Aron Baba in billing at [phone] who informed her he would try to get the problem corrected for her as well. Would like a call back regarding this

## 2014-10-13 NOTE — Telephone Encounter (Signed)
Spoke with Dawn in coding. This visit will be changed to Level 4 OV and re-filed with insurance. Spoke with patient and informed her of this, she will contact me when any further issues

## 2014-10-21 ENCOUNTER — Ambulatory Visit (INDEPENDENT_AMBULATORY_CARE_PROVIDER_SITE_OTHER): Payer: Medicare Other | Admitting: Family Medicine

## 2014-10-21 ENCOUNTER — Encounter: Payer: Self-pay | Admitting: Family Medicine

## 2014-10-21 VITALS — BP 116/70 | HR 61 | Temp 98.0°F | Resp 18 | Ht 63.25 in | Wt 163.0 lb

## 2014-10-21 DIAGNOSIS — S8992XA Unspecified injury of left lower leg, initial encounter: Secondary | ICD-10-CM

## 2014-10-21 DIAGNOSIS — I701 Atherosclerosis of renal artery: Secondary | ICD-10-CM | POA: Diagnosis not present

## 2014-10-21 NOTE — Patient Instructions (Signed)
Knee, Cartilage (Meniscus) Injury It is suspected that you have a torn cartilage (meniscus) in your knee. The menisci are made of tough cartilage and fit between the surfaces of the thigh and leg bones. The menisci are C-shaped and have a wedged profile. The wedged profile helps the stability of the joint by keeping the rounded femur surface from sliding off the flat tibial surface. The menisci are fed (nourished) by small blood vessels, but there is also a large area at the inner edge of the meniscus that does not have a good blood supply (avascular). This presents a problem when there is an injury to the meniscus because areas without good blood supply heal poorly. As a result when there is a torn cartilage in the knee, surgery is often required to fix it. This is usually done with a surgical procedure less invasive than open surgery (arthroscopy). Some times open surgery of the knee is required if there is other damage. PURPOSE OF THE MENISCUS The medial meniscus rests on the medial tibial plateau. The tibia is the large bone in your lower leg (the shin bone). The medial tibial plateau is the upper end of the bone making up the inner part of your knee. The lateral meniscus serves the same purpose and is located on the outside of the knee. The menisci help to distribute your body weight across the knee joint; they act as shock absorbers. Without the meniscus present, the weight of your body would be unevenly applied to the bones in your legs (the femur and tibia). The femur is the large bone in your thigh. This uneven weight distribution would cause increased wear and tear on the cartilage lining the joint surfaces, leading to early damage (arthritis) of these areas. The presence of the menisci cartilage is necessary for a healthy knee. PURPOSE OF THE KNEE CARTILAGE The knee joint is made up of three bones: the thigh bone (femur), the shin bone (tibia), and the knee cap (patella). The surfaces of these bones  at the knee joint are covered with cartilage called articular cartilage. This smooth, slippery surface allows the bones to slide against each other without causing bone damage. The meniscus sits between these cartilaginous surfaces of the bones. It distributes the weight evenly in the joints and helps with the stability of the joint (keeps the joint steady). HOME CARE INSTRUCTIONS  Use crutches and external braces as instructed.  Once home, an ice pack applied to your injured knee may help with discomfort and keep the swelling down. An ice pack can be used for the first couple of days or as instructed.  Only take over-the-counter or prescription medicines for pain, discomfort, or fever as directed by your caregiver.  Call if you do not have relief of pain with medications or if there is increasing in pain.  Call if your foot becomes cold or blue.  You may resume normal diet and activities as directed.  Make sure to keep your appointments with your follow-up caregiver. This injury may require further evaluation and treatment beyond the temporary treatment given today. Document Released: 05/11/2002 Document Revised: 07/05/2013 Document Reviewed: 09/02/2008 [facility] Patient Information 2015 [place], [place]. This information is not intended to replace advice given to you by your health care provider. Make sure you discuss any questions you have with your health care provider.  - Rest, ice, take aleve 2x a day. Elevate your leg, wear compression sleeve at all times.  If not better in 1-2 week or if continues  to hurt/swell--> please return to office, would want to try a steroid joint injection.

## 2014-10-21 NOTE — Progress Notes (Signed)
Subjective:    Patient ID: Taylor Vaughn, female    DOB: 11-24-1929, 81 y.o.   MRN: [id number]  HPI  Left knee pain: Patient presents for acute office visit for left knee pain that started 1 week ago. Patient states she was trying to get some exercise, and was climbing stairs. She denies any acute pain while walking, popping or clicking of the knee. The following morning she woke up and she cannot move her knee secondary to pain. She points to the medial aspect of her left knee as location of pain. Patient states that she initially iced it, and try to rest, and she felt improvement for a few days. However 4 days ago she tried to go watch a ball game, and after sitting for a while, and then climbing stairs she noticed the next morning she could not walk. Patient has a history of arthritis in her hands, and a history of prior low meniscal injury in her left knee. She has never had surgery on her knee. She's never had any direct trauma to her knee. She has no history of gout. She denies any fever or erythema over her knee. She states she noticed this morning she had some soft tissue swelling on the medial side of her knee. She is not on any blood thinners. She is not immunocompromised.   Former smoker  Past Medical History  Diagnosis Date  . Chicken pox as a child  . Measles 17  . Mumps as a child  . Whooping cough 7 yrs old  . Hypertension   . Hyperlipidemia   . Insomnia   . Adenomatous colon polyp     3 more polyps on colonoscopy 04/27/14 [facility]; repeat colonoscopy 1 yr  . Urinary frequency 07/22/2011  . Acute chest wall pain 07/22/2011  . Preventative health care 07/22/2011  . Right shoulder strain 10/03/2011  . MRSA (methicillin resistant staph aureus) culture positive 10/03/2011  . Grief reaction 10/03/2011  . Candidal skin infection 10/03/2011  . Allergic rhinitis 11/22/2011  . Renal artery stenosis 11/24/2011    bilat, worse 12/2013, but no stenting of renal arteries is  indicated as of 03/2014 per Dr. Gwenlyn Found.  . Cervical cancer screening 03/19/2012  . Tinea corporis 11/15/2012    Under both breasts R>L, left foot.   . Cataract 01/11/2013  . Diverticulosis 05/16/2013  . Medicare annual wellness visit, subsequent 07/22/2011    Follows with Dr Erlene Quan of GI Follow with Dr Sallyanne Kuster of Cardiology   . Presbycusis of both ears 12/2013    with tinnitus of both ears (Dr. Erik Obey)   Allergies  Allergen Reactions  . Lisinopril Cough  . Statins     Muscle cramps, rashes, loss of hair  . Keflex [Cephalexin] Rash  . Losartan     phlegm   Past Surgical History  Procedure Laterality Date  . Cholecystectomy  1992  . Skin biopsy      multiple, all benign  . Colonoscopy with polypectomy  multiple; most recent 04/27/14    adenomas (many) +high grade dysplasia on most recent scope 03/2012; repeat 04/27/14 showed 3 more polyps; needs repeat colonoscopy 1 yr  . Nm myocar multiple w/spect  05/2009    EF 74%. NORMAL MYOCARDIAL PERFUSION STUDY.  . Renal duplex  05/2011    RRA- 60-99% REDUCTION. LEFT MID + DISTAL RA- 1-59% DIAMETER REDUCTION BY VELOCITIES VS FIBROMUSCULAR DYSPLASIA WHICH CAN NOT BE EXCLUDED. R & L KIDNEYS- NORMAL.   Family History  Problem Relation Age of Onset  . Breast cancer Mother 2    remission, late 78's lung cancer  . Hypertension Mother     ?  Marland Kitchen Angina Father   . Diabetes type II Father   . Heart disease Father   . Obesity Sister   . Diabetes type II Sister   . Hyperlipidemia Sister   . Heart attack Sister   . Spina bifida Brother   . Constipation Daughter   . Hypertension Daughter   . Hyperlipidemia Daughter   . Obesity Son   . Hypertension Son   . Obesity Maternal Grandmother   . Heart attack Maternal Grandmother   . Stroke Paternal Grandfather   . Lung cancer Mother    Social History   Social History  . Marital Status: Widowed    Spouse Name: N/A  . Number of Children: N/A  . Years of Education: N/A   Occupational History  . Not  on file.   Social History Main Topics  . Smoking status: Former Smoker    Types: Cigarettes  . Smokeless tobacco: Never Used     Comment: smoked for 3 years in 30's  . Alcohol Use: No  . Drug Use: No  . Sexual Activity: No   Other Topics Concern  . Not on file   Social History Narrative   Review of Systems Negative, with the exception of above mentioned in HPI     Objective:   Physical Exam BP 116/70 mmHg  Pulse 61  Temp(Src) 98 F (36.7 C) (Temporal)  Resp 18  Ht 5' 3.25" (1.607 m)  Wt 163 lb (73.936 kg)  BMI 28.63 kg/m2  SpO2 98% Gen: Afebrile. No acute distress. Nontoxic in appearance, well-developed, well-nourished Caucasian female. Left knee: No erythema, skin intact, soft tissue swelling present over medial joint line. Small effusion present. Tender to palpation medial joint line. Decreased extension with mild discomfort, decreased flexion with moderate to severe discomfort. No ligament laxity. Negative Lachman's. Neurovascularly intact distally.     Assessment & Plan:  Taylor Vaughn is a 81 y.o. female present for acute left knee pain and swelling, likely secondary to meniscal injury. - Exam positive for soft tissue swelling and tenderness over medial joint line. Compression sleeve supplied for left knee. Patient instructed to wear this as much as possible. Patient encouraged to rest, elevate, and use ice a few times a day, no more than 15 minutes at a time. Use Aleve 2 times a day for the next few days to help as an anti-inflammatory and comfort. If patient does not see improvement in one week, her knee continues to swell, she is to return at that time we would consider a steroid  joint injection.

## 2014-10-21 NOTE — Progress Notes (Signed)
Pre visit review using our clinic review tool, if applicable. No additional management support is needed unless otherwise documented below in the visit note. 

## 2015-02-14 ENCOUNTER — Other Ambulatory Visit: Payer: Self-pay | Admitting: Cardiovascular Disease

## 2015-02-14 DIAGNOSIS — I701 Atherosclerosis of renal artery: Secondary | ICD-10-CM

## 2015-02-21 ENCOUNTER — Ambulatory Visit (HOSPITAL_COMMUNITY)
Admission: RE | Admit: 2015-02-21 | Discharge: 2015-02-21 | Disposition: A | Discharge status: Home / Self Care | Payer: Medicare Other | Source: Ambulatory Visit | Attending: Cardiology | Admitting: Cardiology

## 2015-02-21 DIAGNOSIS — I700 Atherosclerosis of aorta: Secondary | ICD-10-CM | POA: Insufficient documentation

## 2015-02-21 DIAGNOSIS — I701 Atherosclerosis of renal artery: Secondary | ICD-10-CM | POA: Diagnosis present

## 2015-02-21 DIAGNOSIS — I10 Essential (primary) hypertension: Secondary | ICD-10-CM | POA: Insufficient documentation

## 2015-02-21 DIAGNOSIS — E785 Hyperlipidemia, unspecified: Secondary | ICD-10-CM | POA: Diagnosis not present

## 2015-03-01 ENCOUNTER — Encounter: Payer: Self-pay | Admitting: *Deleted

## 2015-03-10 ENCOUNTER — Ambulatory Visit (INDEPENDENT_AMBULATORY_CARE_PROVIDER_SITE_OTHER): Payer: Medicare Other | Admitting: Cardiovascular Disease

## 2015-03-10 ENCOUNTER — Encounter: Payer: Self-pay | Admitting: Cardiovascular Disease

## 2015-03-10 VITALS — BP 112/70 | HR 80 | Ht 63.0 in | Wt 168.3 lb

## 2015-03-10 DIAGNOSIS — E785 Hyperlipidemia, unspecified: Secondary | ICD-10-CM | POA: Diagnosis not present

## 2015-03-10 DIAGNOSIS — I701 Atherosclerosis of renal artery: Secondary | ICD-10-CM | POA: Diagnosis not present

## 2015-03-10 DIAGNOSIS — I10 Essential (primary) hypertension: Secondary | ICD-10-CM | POA: Diagnosis not present

## 2015-03-10 NOTE — Assessment & Plan Note (Signed)
History of hypertension blood pressure measured at 112/70. She is on amlodipine and metoprolol. Continue current meds and current dosing

## 2015-03-10 NOTE — Patient Instructions (Signed)
Medication Instructions:  CRT or cardiac resynchronization therapy is a treatment used to correct heart failure. When you have heart failure your heart is weakened and doesn't pump as well as it should. This therapy may help reduce symptoms and improve the quality of life.  Please see the handout/brochure given to you today to get more information of the different options of therapy.    Labwork: none  Testing/Procedures: none  Follow-Up: Your physician wants you to follow-up in: 12 months with Dr. Gwenlyn Found. You will receive a reminder letter in the mail two months in advance. If you don't receive a letter, please call our office to schedule the follow-up appointment.   Any Other Special Instructions Will Be Listed Below (If Applicable).     If you need a refill on your cardiac medications before your next appointment, please call your pharmacy.

## 2015-03-10 NOTE — Assessment & Plan Note (Signed)
History of moderate right renal artery stenosis which has not changed in severity by recent duplex performed 02/21/15.

## 2015-03-10 NOTE — Assessment & Plan Note (Signed)
History of hyperlipidemia intolerant to statin drugs 

## 2015-03-10 NOTE — Progress Notes (Signed)
03/10/2015 Taylor Vaughn   10-17-1929  [id number]  Primary Physician Tammi Sou, MD Primary Cardiologist: Lorretta Harp MD Renae Gloss   HPI:  [name] is a 82 year old moderately overweight widowed Caucasian female mother of 3 children, grandmother and 6 grandchildren who has a PhD in [place] and works as a Investment banker, operational. Her primary care physician is Dr. Ruby Cola. I last saw her in the office 03/09/14.She was referred by Dr. Ena Dawley, cardiologist, for peripheral vascular evaluation to  evaluate her renal artery for potential intervention. Her cardiovascular risk factor profile is notable for hypertension and hyperlipidemia. She is not diabetic. She's never had a heart attack or stroke. She denies chest pain or shortness of breath. She had renal Dopplers done in 2011 here in our practice that showed moderate right renal artery stenosis with a renal aortic aortic ratio of 4.4. This had not changed on serial Dopplers after that. Her blood pressure seems to be well-controlled on 2 medications.   Current Outpatient Prescriptions  Medication Sig Dispense Refill  . Menaquinone-7 (VITAMIN K2 PO) Take by mouth. 1 tablet by mouth once daily    . Multiple Vitamin (MULTIVITAMIN) capsule Take 1 capsule by mouth daily.    Marland Kitchen amLODipine (NORVASC) 5 MG tablet Take 1 tablet (5 mg total) by mouth daily. 90 tablet 3  . Biotin 2500 MCG CAPS Take 1 tablet by mouth daily.    . Fish Oil-Coenzyme Q10 (FISH OIL PLUS CO Q-10) 1000-30 MG CAPS Take 1 tablet by mouth daily.    . metoprolol succinate (TOPROL-XL) 25 MG 24 hr tablet Take 1 tablet (25 mg total) by mouth daily. 90 tablet 3  . Misc Natural Products (TART CHERRY ADVANCED PO) Take 1,000 mg by mouth daily.    . Multiple Vitamins-Minerals (CENTRUM SILVER ADULT 50+ PO) Take 1 tablet by mouth daily.    . niacin 500 MG tablet Take 2 tablets (1,000 mg total) by mouth daily with breakfast.    . Soya  Lecithin 1200 MG CAPS Take 1,200 mg by mouth daily.     . vitamin E 400 UNIT capsule Take 400 Units by mouth daily.     No current facility-administered medications for this visit.    Allergies  Allergen Reactions  . Lisinopril Cough  . Statins     Muscle cramps, rashes, loss of hair  . Keflex [Cephalexin] Rash  . Losartan     phlegm    Social History   Social History  . Marital Status: Widowed    Spouse Name: N/A  . Number of Children: N/A  . Years of Education: N/A   Occupational History  . Not on file.   Social History Main Topics  . Smoking status: Former Smoker    Types: Cigarettes  . Smokeless tobacco: Never Used     Comment: smoked for 3 years in 30's  . Alcohol Use: No  . Drug Use: No  . Sexual Activity: No   Other Topics Concern  . Not on file   Social History Narrative     Review of Systems: General: negative for chills, fever, night sweats or weight changes.  Cardiovascular: negative for chest pain, dyspnea on exertion, edema, orthopnea, palpitations, paroxysmal nocturnal dyspnea or shortness of breath Dermatological: negative for rash Respiratory: negative for cough or wheezing Urologic: negative for hematuria Abdominal: negative for nausea, vomiting, diarrhea, bright red blood per rectum, melena, or hematemesis Neurologic: negative for visual changes, syncope, or dizziness All other systems  reviewed and are otherwise negative except as noted above.    Blood pressure 112/70, pulse 80, height 5\' 3"  (1.6 m), weight 168 lb 4.8 oz (76.34 kg).  General appearance: alert and no distress Neck: no adenopathy, no carotid bruit, no JVD, supple, symmetrical, trachea midline and thyroid not enlarged, symmetric, no tenderness/mass/nodules Lungs: clear to auscultation bilaterally Heart: regular rate and rhythm, S1, S2 normal, no murmur, click, rub or gallop Extremities: extremities normal, atraumatic, no cyanosis or edema  EKG sinus rhythm at 80 with a Q in  lead 3 otherwise no ST or T-wave changes. I personally reviewed this EKG  ASSESSMENT AND PLAN:   Hypertension History of hypertension blood pressure measured at 112/70. She is on amlodipine and metoprolol. Continue current meds and current dosing  Hyperlipidemia History of hyperlipidemia intolerant to statin drugs  Renal artery stenosis History of moderate right renal artery stenosis which has not changed in severity by recent duplex performed 02/21/15.      Lorretta Harp MD FACP,FACC,FAHA, [facility] 03/10/2015 1:50 PM

## 2015-03-14 ENCOUNTER — Telehealth: Payer: Self-pay | Admitting: Family Medicine

## 2015-03-14 ENCOUNTER — Ambulatory Visit (INDEPENDENT_AMBULATORY_CARE_PROVIDER_SITE_OTHER): Payer: Medicare Other | Admitting: Family Medicine

## 2015-03-14 ENCOUNTER — Ambulatory Visit (HOSPITAL_BASED_OUTPATIENT_CLINIC_OR_DEPARTMENT_OTHER)
Admission: RE | Admit: 2015-03-14 | Discharge: 2015-03-14 | Disposition: A | Discharge status: Home / Self Care | Payer: Medicare Other | Source: Ambulatory Visit | Attending: Family Medicine | Admitting: Family Medicine

## 2015-03-14 ENCOUNTER — Encounter: Payer: Self-pay | Admitting: Family Medicine

## 2015-03-14 VITALS — BP 136/85 | HR 66 | Temp 98.0°F | Resp 20 | Wt 170.5 lb

## 2015-03-14 DIAGNOSIS — R059 Cough, unspecified: Secondary | ICD-10-CM

## 2015-03-14 DIAGNOSIS — J0100 Acute maxillary sinusitis, unspecified: Secondary | ICD-10-CM

## 2015-03-14 DIAGNOSIS — R05 Cough: Secondary | ICD-10-CM | POA: Insufficient documentation

## 2015-03-14 DIAGNOSIS — M4184 Other forms of scoliosis, thoracic region: Secondary | ICD-10-CM | POA: Insufficient documentation

## 2015-03-14 DIAGNOSIS — I701 Atherosclerosis of renal artery: Secondary | ICD-10-CM | POA: Diagnosis not present

## 2015-03-14 DIAGNOSIS — R509 Fever, unspecified: Secondary | ICD-10-CM | POA: Diagnosis not present

## 2015-03-14 MED ORDER — FLUTICASONE PROPIONATE 50 MCG/ACT NA SUSP: 2.0000 | Freq: Every day | NASAL | Status: DC

## 2015-03-14 MED ORDER — GUAIFENESIN ER 600 MG PO TB12: 600.0000 mg | ORAL_TABLET | Freq: Two times a day (BID) | ORAL | Status: DC | PRN

## 2015-03-14 MED ORDER — DOXYCYCLINE HYCLATE 100 MG PO TABS: 100.0000 mg | ORAL_TABLET | Freq: Two times a day (BID) | ORAL | Status: DC

## 2015-03-14 NOTE — Progress Notes (Signed)
Patient ID: Taylor Vaughn, female   DOB: 09-01-1929, 82 y.o.   MRN: [id number]   Subjective:    Patient ID: Taylor Vaughn   DOB: 22-Oct-1929, 82 y.o.    MRN: [id number]  HPI  Cough: pt presents with a 2 week history of cough, that is sometimes productive, but mostly dry. Night time is worse for her cough. She endorses nasal congestion, rhinorrhea, sinus pressure left side, ear fullness, mid sore throat. Pt endorses  "Squeaks" in her lungs with deep inspiration. She denies shortness of breath. At the beginning of the onset of symptoms she had a low grade fever and chills. Her daughter and son-and-law are  Sick. She is eating and drinking ok.  No asthma or COPD history. No flu shot, does not believe in it, makes her sick.  OTC: Sudafed use only.   Past Medical History  Diagnosis Date  . Chicken pox as a child  . Measles 17  . Mumps as a child  . Whooping cough 7 yrs old  . Hypertension   . Hyperlipidemia     statin intolerant  . Insomnia   . Adenomatous colon polyp     3 more polyps on colonoscopy 04/27/14 by [facility]; repeat colonoscopy 1 yr  . Urinary frequency 07/22/2011  . Acute chest wall pain 07/22/2011  . Preventative health care 07/22/2011  . Right shoulder strain 10/03/2011  . MRSA (methicillin resistant staph aureus) culture positive 10/03/2011  . Grief reaction 10/03/2011  . Candidal skin infection 10/03/2011  . Allergic rhinitis 11/22/2011  . Renal artery stenosis ([place]) 11/24/2011    bilat, worse 12/2013, but no stenting of renal arteries is indicated as of 03/2014 per Dr. Gwenlyn Found.  Stable on f/u ultrasound 02/2015 per Dr. Kennon Holter f/u.  Marland Kitchen Cervical cancer screening 03/19/2012  . Tinea corporis 11/15/2012    Under both breasts R>L, left foot.   . Cataract 01/11/2013  . Diverticulosis 05/16/2013  . Medicare annual wellness visit, subsequent 07/22/2011    Follows with Dr Erlene Quan of GI Follow with Dr Sallyanne Kuster of Cardiology   . Presbycusis of both ears 12/2013    with  tinnitus of both ears (Dr. Erik Obey)   Allergies  Allergen Reactions  . Lisinopril Cough  . Statins     Muscle cramps, rashes, loss of hair  . Keflex [Cephalexin] Rash  . Losartan     phlegm   Past Surgical History  Procedure Laterality Date  . Cholecystectomy  1992  . Skin biopsy      multiple, all benign  . Colonoscopy with polypectomy  multiple; most recent 04/27/14    adenomas (many) +high grade dysplasia on most recent scope 03/2012; repeat 04/27/14 showed 3 more polyps; needs repeat colonoscopy 1 yr  . Nm myocar multiple w/spect  05/2009    EF 74%. NORMAL MYOCARDIAL PERFUSION STUDY.  . Renal duplex  05/2011    RRA- 60-99% REDUCTION. LEFT MID + DISTAL RA- 1-59% DIAMETER REDUCTION BY VELOCITIES VS FIBROMUSCULAR DYSPLASIA WHICH CAN NOT BE EXCLUDED. R & L KIDNEYS- NORMAL.   Social History  Substance Use Topics  . Smoking status: Former Smoker    Types: Cigarettes  . Smokeless tobacco: Never Used     Comment: smoked for 3 years in 30's  . Alcohol Use: No   Review of Systems Negative, with the exception of above mentioned in HPI    Objective:   Physical Exam BP 136/85 mmHg  Pulse 66  Temp(Src) 98 F (36.7 C)  Resp 20  Wt 170 lb 8 oz (77.338 kg)  SpO2 97% Body mass index is 30.21 kg/(m^2). Gen: Afebrile. No acute distress. Nontoxic in appearance. Well developed, well nourished, pleasant female.  HENT: AT. [place]. Bilateral TM visualized, shiny, fluid filled. MMM, no oral lesions. Bilateral nares with erythema and swelling. Throat no erythema, no exudates. Cough present, Hoarseness present, TTP max sinus Eyes:Pupils Equal Round Reactive to light, Extraocular movements intact,  Conjunctiva without redness, discharge or icterus. Neck/lymp/endocrine: Supple,shotty ant cervical lymphadenopathy CV: RRR  Chest: RLL crackles. Cough with deep inspiration, rhonchi present. No wheeze. Good air movement. Normal resp effort.  Abd: Soft. NTND. BS present Skin: No rashes, purpura or  petechiae.    Assessment & Plan:  Taylor Vaughn is a 82 y.o. present for acute OV with  1. Acute maxillary sinusitis, recurrence not specified -  Mucinex, flonase, Humidifier - Rest, hydrate  - doxycycline (VIBRA-TABS) 100 MG tablet; Take 1 tablet (100 mg total) by mouth 2 (two) times daily.  Dispense: 20 tablet; Refill: 0 - fluticasone (FLONASE) 50 MCG/ACT nasal spray; Place 2 sprays into both nostrils daily.  Dispense: 16 g; Refill: 0  2. Cough - R/O PNA considering time line and RLL crackles.  - DG Chest 2 View; Future - guaiFENesin (MUCINEX) 600 MG 12 hr tablet; Take 1 tablet (600 mg total) by mouth 2 (two) times daily as needed.  Dispense: 60 tablet; Refill: 0  F/U dependent on CXR, if + will need to follow up 2-4 weeks with rpt CXR.

## 2015-03-14 NOTE — Telephone Encounter (Signed)
Please call pt: - her CXR is clear. No signs of Pneumonia. Continue with treatment discussed today and follow up if no improvement or worsening sypmtoms in 1-2 weeks.

## 2015-03-14 NOTE — Patient Instructions (Signed)
Sinusitis, Adult Sinusitis is redness, soreness, and inflammation of the paranasal sinuses. Paranasal sinuses are air pockets within the bones of your face. They are located beneath your eyes, in the middle of your forehead, and above your eyes. In healthy paranasal sinuses, mucus is able to drain out, and air is able to circulate through them by way of your nose. However, when your paranasal sinuses are inflamed, mucus and air can become trapped. This can allow bacteria and other germs to grow and cause infection. Sinusitis can develop quickly and last only a short time (acute) or continue over a long period (chronic). Sinusitis that lasts for more than 12 weeks is considered chronic. CAUSES Causes of sinusitis include:  Allergies.  Structural abnormalities, such as displacement of the cartilage that separates your nostrils (deviated septum), which can decrease the air flow through your nose and sinuses and affect sinus drainage.  Functional abnormalities, such as when the small hairs (cilia) that line your sinuses and help remove mucus do not work properly or are not present. SIGNS AND SYMPTOMS Symptoms of acute and chronic sinusitis are the same. The primary symptoms are pain and pressure around the affected sinuses. Other symptoms include:  Upper toothache.  Earache.  Headache.  Bad breath.  Decreased sense of smell and taste.  A cough, which worsens when you are lying flat.  Fatigue.  Fever.  Thick drainage from your nose, which often is green and may contain pus (purulent).  Swelling and warmth over the affected sinuses. DIAGNOSIS Your health care provider will perform a physical exam. During your exam, your health care provider may perform any of the following to help determine if you have acute sinusitis or chronic sinusitis:  Look in your nose for signs of abnormal growths in your nostrils (nasal polyps).  Tap over the affected sinus to check for signs of  infection.  View the inside of your sinuses using an imaging device that has a light attached (endoscope). If your health care provider suspects that you have chronic sinusitis, one or more of the following tests may be recommended:  Allergy tests.  Nasal culture. A sample of mucus is taken from your nose, sent to a lab, and screened for bacteria.  Nasal cytology. A sample of mucus is taken from your nose and examined by your health care provider to determine if your sinusitis is related to an allergy. TREATMENT Most cases of acute sinusitis are related to a viral infection and will resolve on their own within 10 days. Sometimes, medicines are prescribed to help relieve symptoms of both acute and chronic sinusitis. These may include pain medicines, decongestants, nasal steroid sprays, or saline sprays. However, for sinusitis related to a bacterial infection, your health care provider will prescribe antibiotic medicines. These are medicines that will help kill the bacteria causing the infection. Rarely, sinusitis is caused by a fungal infection. In these cases, your health care provider will prescribe antifungal medicine. For some cases of chronic sinusitis, surgery is needed. Generally, these are cases in which sinusitis recurs more than 3 times per year, despite other treatments. HOME CARE INSTRUCTIONS  Drink plenty of water. Water helps thin the mucus so your sinuses can drain more easily.  Use a humidifier.  Inhale steam 3-4 times a day (for example, sit in the bathroom with the shower running).  Apply a warm, moist washcloth to your face 3-4 times a day, or as directed by your health care provider.  Use saline nasal sprays to help   moisten and clean your sinuses.  Take medicines only as directed by your health care provider.  If you were prescribed either an antibiotic or antifungal medicine, finish it all even if you start to feel better. SEEK IMMEDIATE MEDICAL CARE IF:  You have  increasing pain or severe headaches.  You have nausea, vomiting, or drowsiness.  You have swelling around your face.  You have vision problems.  You have a stiff neck.  You have difficulty breathing.   This information is not intended to replace advice given to you by your health care provider. Make sure you discuss any questions you have with your health care provider.   Document Released: 02/18/2005 Document Revised: 03/11/2014 Document Reviewed: 03/05/2011 Elsevier Interactive Patient Education 2016 [place].  - doxycyline antibiotic, Mucinex, flonase prescribed - Rest, hydrated.  - Use Humidifier.  - Chest xray at [facility] off of [address]

## 2015-03-14 NOTE — Telephone Encounter (Signed)
Spoke with patient reviewed xray results and instructions with patient. 

## 2015-03-15 NOTE — Addendum Note (Signed)
Addended by: Therisa Doyne on: 03/15/2015 01:36 PM   Modules accepted: Orders

## 2015-03-22 ENCOUNTER — Telehealth: Payer: Self-pay | Admitting: Cardiovascular Disease

## 2015-03-22 NOTE — Telephone Encounter (Signed)
Pt says there are some things on my chart that she does not understand about her EKG.She had this on 03-10-15,when she came in to see Dr Gwenlyn Found.   Patient was concerned that the computer reading printed "Abnormal" and it indicated an inferior MI Explained to patient the reasons it would show abnormal and how the computer interprets and that all readings are over read by her cardiologist Understands and his grateful with explanation

## 2015-03-22 NOTE — Telephone Encounter (Signed)
Pt says there are some things on my chart that she does not understand about her EKG.She had this on 03-10-15,when she came in to see Dr Gwenlyn Found.

## 2015-03-29 ENCOUNTER — Encounter (HOSPITAL_COMMUNITY): Payer: Self-pay

## 2015-03-29 ENCOUNTER — Emergency Department (HOSPITAL_COMMUNITY)
Admission: EM | Admit: 2015-03-29 | Discharge: 2015-03-29 | Disposition: A | Discharge status: Home / Self Care | Payer: No Typology Code available for payment source | Attending: Emergency Medicine | Admitting: Emergency Medicine

## 2015-03-29 DIAGNOSIS — Z86010 Personal history of colonic polyps: Secondary | ICD-10-CM | POA: Diagnosis not present

## 2015-03-29 DIAGNOSIS — Y9389 Activity, other specified: Secondary | ICD-10-CM | POA: Diagnosis not present

## 2015-03-29 DIAGNOSIS — Z87891 Personal history of nicotine dependence: Secondary | ICD-10-CM | POA: Diagnosis not present

## 2015-03-29 DIAGNOSIS — Y998 Other external cause status: Secondary | ICD-10-CM | POA: Insufficient documentation

## 2015-03-29 DIAGNOSIS — E785 Hyperlipidemia, unspecified: Secondary | ICD-10-CM | POA: Diagnosis not present

## 2015-03-29 DIAGNOSIS — Y92481 Parking lot as the place of occurrence of the external cause: Secondary | ICD-10-CM | POA: Diagnosis not present

## 2015-03-29 DIAGNOSIS — Z8619 Personal history of other infectious and parasitic diseases: Secondary | ICD-10-CM | POA: Diagnosis not present

## 2015-03-29 DIAGNOSIS — Z8679 Personal history of other diseases of the circulatory system: Secondary | ICD-10-CM | POA: Insufficient documentation

## 2015-03-29 DIAGNOSIS — Z8614 Personal history of Methicillin resistant Staphylococcus aureus infection: Secondary | ICD-10-CM | POA: Diagnosis not present

## 2015-03-29 DIAGNOSIS — Z79899 Other long term (current) drug therapy: Secondary | ICD-10-CM | POA: Diagnosis not present

## 2015-03-29 DIAGNOSIS — Z041 Encounter for examination and observation following transport accident: Secondary | ICD-10-CM | POA: Diagnosis present

## 2015-03-29 DIAGNOSIS — H269 Unspecified cataract: Secondary | ICD-10-CM | POA: Diagnosis not present

## 2015-03-29 DIAGNOSIS — Z8719 Personal history of other diseases of the digestive system: Secondary | ICD-10-CM | POA: Insufficient documentation
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
## 2015-05-02 ENCOUNTER — Ambulatory Visit (HOSPITAL_BASED_OUTPATIENT_CLINIC_OR_DEPARTMENT_OTHER)
Admission: RE | Admit: 2015-05-02 | Discharge: 2015-05-02 | Disposition: A | Discharge status: Home / Self Care | Payer: Medicare Other | Source: Ambulatory Visit | Attending: Family Medicine | Admitting: Family Medicine

## 2015-05-02 ENCOUNTER — Encounter: Payer: Self-pay | Admitting: Family Medicine

## 2015-05-02 ENCOUNTER — Ambulatory Visit (INDEPENDENT_AMBULATORY_CARE_PROVIDER_SITE_OTHER): Payer: Medicare Other | Admitting: Family Medicine

## 2015-05-02 VITALS — BP 124/69 | HR 59 | Temp 98.1°F | Resp 20 | Wt 171.8 lb

## 2015-05-02 DIAGNOSIS — M79604 Pain in right leg: Secondary | ICD-10-CM

## 2015-05-02 DIAGNOSIS — M25561 Pain in right knee: Secondary | ICD-10-CM

## 2015-05-02 DIAGNOSIS — M5136 Other intervertebral disc degeneration, lumbar region: Secondary | ICD-10-CM | POA: Diagnosis not present

## 2015-05-02 DIAGNOSIS — M8938 Hypertrophy of bone, other site: Secondary | ICD-10-CM | POA: Insufficient documentation

## 2015-05-02 DIAGNOSIS — I739 Peripheral vascular disease, unspecified: Secondary | ICD-10-CM | POA: Insufficient documentation

## 2015-05-02 DIAGNOSIS — I701 Atherosclerosis of renal artery: Secondary | ICD-10-CM

## 2015-05-02 DIAGNOSIS — L989 Disorder of the skin and subcutaneous tissue, unspecified: Secondary | ICD-10-CM | POA: Diagnosis not present
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
## 2015-05-02 NOTE — Patient Instructions (Signed)
Obtain xray of back and knee at [facility] point.  I will call you with results once available.  [address] [place], [place] [address]   Use naproxen every 12 hours for 7 days. Use ice (20 minutes only, never directly on skin)  If not resolved or greatly improved in 2 weeks, I would want to see you again and consider different therapy.   Sciatica Sciatica is pain, weakness, numbness, or tingling along the path of the sciatic nerve. The nerve starts in the lower back and runs down the back of each leg. The nerve controls the muscles in the lower leg and in the back of the knee, while also providing sensation to the back of the thigh, lower leg, and the sole of your foot. Sciatica is a symptom of another medical condition. For instance, nerve damage or certain conditions, such as a herniated disk or bone spur on the spine, pinch or put pressure on the sciatic nerve. This causes the pain, weakness, or other sensations normally associated with sciatica. Generally, sciatica only affects one side of the body. CAUSES   Herniated or slipped disc.  Degenerative disk disease.  A pain disorder involving the narrow muscle in the buttocks (piriformis syndrome).  Pelvic injury or fracture.  Pregnancy.  Tumor (rare). SYMPTOMS  Symptoms can vary from mild to very severe. The symptoms usually travel from the low back to the buttocks and down the back of the leg. Symptoms can include:  Mild tingling or dull aches in the lower back, leg, or hip.  Numbness in the back of the calf or sole of the foot.  Burning sensations in the lower back, leg, or hip.  Sharp pains in the lower back, leg, or hip.  Leg weakness.  Severe back pain inhibiting movement. These symptoms may get worse with coughing, sneezing, laughing, or prolonged sitting or standing. Also, being overweight may worsen symptoms. DIAGNOSIS  Your caregiver will perform a physical exam to look for common symptoms of sciatica. He  or she may ask you to do certain movements or activities that would trigger sciatic nerve pain. Other tests may be performed to find the cause of the sciatica. These may include:  Blood tests.  X-rays.  Imaging tests, such as an MRI or CT scan. TREATMENT  Treatment is directed at the cause of the sciatic pain. Sometimes, treatment is not necessary and the pain and discomfort goes away on its own. If treatment is needed, your caregiver may suggest:  Over-the-counter medicines to relieve pain.  Prescription medicines, such as anti-inflammatory medicine, muscle relaxants, or narcotics.  Applying heat or ice to the painful area.  Steroid injections to lessen pain, irritation, and inflammation around the nerve.  Reducing activity during periods of pain.  Exercising and stretching to strengthen your abdomen and improve flexibility of your spine. Your caregiver may suggest losing weight if the extra weight makes the back pain worse.  Physical therapy.  Surgery to eliminate what is pressing or pinching the nerve, such as a bone spur or part of a herniated disk. HOME CARE INSTRUCTIONS   Only take over-the-counter or prescription medicines for pain or discomfort as directed by your caregiver.  Apply ice to the affected area for 20 minutes, 3-4 times a day for the first 48-72 hours. Then try heat in the same way.  Exercise, stretch, or perform your usual activities if these do not aggravate your pain.  Attend physical therapy sessions as directed by your caregiver.  Keep all follow-up appointments as directed by your caregiver.  Do not wear high heels or shoes that do not provide proper support.  Check your mattress to see if it is too soft. A firm mattress may lessen your pain and discomfort. SEEK IMMEDIATE MEDICAL CARE IF:   You lose control of your bowel or bladder (incontinence).  You have increasing weakness in the lower back, pelvis, buttocks, or legs.  You have redness or  swelling of your back.  You have a burning sensation when you urinate.  You have pain that gets worse when you lie down or awakens you at night.  Your pain is worse than you have experienced in the past.  Your pain is lasting longer than 4 weeks.  You are suddenly losing weight without reason. MAKE SURE YOU:  Understand these instructions.  Will watch your condition.  Will get help right away if you are not doing well or get worse.   This information is not intended to replace advice given to you by your health care provider. Make sure you discuss any questions you have with your health care provider.   Document Released: 02/12/2001 Document Revised: 11/09/2014 Document Reviewed: 06/30/2011 Elsevier Interactive Patient Education [employer].

## 2015-05-03 ENCOUNTER — Telehealth: Payer: Self-pay | Admitting: Family Medicine

## 2015-05-03 NOTE — Telephone Encounter (Signed)
Spoke with patient reviewed xray results and instructions.

## 2015-05-03 NOTE — Telephone Encounter (Signed)
Please call pt: - Her xrays of her spine resulted with moderate arthritis changes, and disk space narrowing. This can cause low back pain and occasional radiation of sensations (all types) down legs.  - With the results of her xrays, if her discomfort does not resolve with discussed therpay in 2 weeks, or if she is worsening (sooner), then I would want to either refer her to specialist or get an MRI and then refer her to specialist.  - her knee xray is normal.  - f/u in 2 weeks if not better, sooner if worsening.    - pt needed derm referral, please see if she found a dermatologist she would like to be referred to.Marland KitchenMarland Kitchen

## 2015-05-11 ENCOUNTER — Encounter: Payer: Self-pay | Admitting: Family Medicine

## 2015-05-29 ENCOUNTER — Ambulatory Visit (INDEPENDENT_AMBULATORY_CARE_PROVIDER_SITE_OTHER): Payer: Medicare Other | Admitting: Family Medicine

## 2015-05-29 ENCOUNTER — Encounter: Payer: Self-pay | Admitting: Family Medicine

## 2015-05-29 VITALS — BP 100/64 | HR 72 | Temp 97.9°F | Resp 20 | Wt 169.5 lb

## 2015-05-29 DIAGNOSIS — I701 Atherosclerosis of renal artery: Secondary | ICD-10-CM

## 2015-05-29 DIAGNOSIS — M5431 Sciatica, right side: Secondary | ICD-10-CM

## 2015-05-29 DIAGNOSIS — M5386 Other specified dorsopathies, lumbar region: Secondary | ICD-10-CM | POA: Insufficient documentation

## 2015-05-29 MED ORDER — NAPROXEN 500 MG PO TABS: 500.0000 mg | ORAL_TABLET | Freq: Two times a day (BID) | ORAL | Status: DC
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
## 2015-05-29 NOTE — Patient Instructions (Signed)
Naproxen two times a day for seven days then as needed. With a meal. The medication we discussed is called tramadol 50 mg, only of needed. physical  therapy refilled placed today, if not improved or worsening with PT, will need to see you and likely order MRI.

## 2015-06-26 ENCOUNTER — Telehealth: Payer: Self-pay | Admitting: Family Medicine

## 2015-06-26 DIAGNOSIS — I10 Essential (primary) hypertension: Secondary | ICD-10-CM

## 2015-06-26 MED ORDER — AMLODIPINE BESYLATE 5 MG PO TABS: 5.0000 mg | ORAL_TABLET | Freq: Every day | ORAL | Status: DC

## 2015-06-26 MED ORDER — METOPROLOL SUCCINATE ER 25 MG PO TB24: 25.0000 mg | ORAL_TABLET | Freq: Every day | ORAL | Status: DC

## 2015-06-26 NOTE — Telephone Encounter (Signed)
Refills sent

## 2015-06-26 NOTE — Telephone Encounter (Signed)
Needs refills for Metoprolol ER 25mg  #90 and Amlodipine 5mg  #90 sent to [facility] order please. They were sent in a few weeks ago but she cancelled them because she was not due to have them yet. Now she will be out on about a week.

## 2015-12-14 ENCOUNTER — Telehealth: Payer: Self-pay | Admitting: Cardiovascular Disease

## 2015-12-14 NOTE — Telephone Encounter (Signed)
SPOKE TO PATIENT.  RN ASKED IF PATIENT CONTACTED PRIMARY- PATIENT STATES THIS DOES NOT UNCONCERNED PRIMARY , DR BERRY "TOLD ME TO CALL HIM IF I HAVE ANY ISSUSES AND I AM HAVING AN ISSUE.I WANT AN APPOINTMENT WITH DR BERRY." APPOINTMENT MADE FOR 12/26/15 WITH DR BERRY.

## 2015-12-14 NOTE — Telephone Encounter (Signed)
Pt calling to make appt with Gwenlyn Found for kidney fu appt, I'm not familiar with this for a cardiologist and didn't feel comfortable scheduling, pt had symptoms of fatigue, trouble walking, bubbles in urine, left ear ringing, for "awhile", symptoms went away a few days ago but she looked up these symptoms online and it showed it was all kidney related-pls advise

## 2015-12-21 ENCOUNTER — Telehealth: Payer: Self-pay | Admitting: Cardiovascular Disease

## 2015-12-21 NOTE — Telephone Encounter (Signed)
Routed to Dr. Kennon Holter nurse to advise. My understanding is we do not order these kinds of labs.

## 2015-12-21 NOTE — Telephone Encounter (Signed)
Returned call to patient. She states she is concerned with her kidneys. She stated seeing bubbles in her urine in the monring and some throughout the day. She researched on the internet and it said that could indicate problem with kidneys. She feels it would be good to have a urine test. Patient denies frequency or pain/burning with urination. I advised if she feels pain/burning or frequency with urination she should see her PCP. I advised for her to keep her appt with Dr Gwenlyn Found and discuss these things with him there and then he will make a determination. She stated she has changed her diet by eating a diet better for her kidneys and she feels a whole lot better.

## 2015-12-21 NOTE — Telephone Encounter (Signed)
Pt would like an order so she can do an urine analysis and blood work before her next Oct 24 next Tuesday.

## 2015-12-26 ENCOUNTER — Ambulatory Visit (INDEPENDENT_AMBULATORY_CARE_PROVIDER_SITE_OTHER): Payer: Medicare Other | Admitting: Cardiovascular Disease

## 2015-12-26 ENCOUNTER — Encounter: Payer: Self-pay | Admitting: Cardiovascular Disease

## 2015-12-26 VITALS — BP 156/76 | HR 66 | Ht 63.0 in | Wt 167.0 lb

## 2015-12-26 DIAGNOSIS — I10 Essential (primary) hypertension: Secondary | ICD-10-CM | POA: Diagnosis not present

## 2015-12-26 DIAGNOSIS — I701 Atherosclerosis of renal artery: Secondary | ICD-10-CM | POA: Diagnosis not present

## 2015-12-26 DIAGNOSIS — E785 Hyperlipidemia, unspecified: Secondary | ICD-10-CM | POA: Diagnosis not present

## 2015-12-26 NOTE — Assessment & Plan Note (Signed)
History of hyperlipidemia intolerant to statin drugs 

## 2015-12-26 NOTE — Assessment & Plan Note (Signed)
History of renal artery stenosis by duplex ultrasound last checked 02/21/15 with a right renal aortic ratio of 4.7. We will repeat renal Doppler studies.

## 2015-12-26 NOTE — Patient Instructions (Signed)
Medication Instructions:  Your physician recommends that you continue on your current medications as directed. Please refer to the Current Medication list given to you today.   Labwork: Your physician recommends that you return for labs today: BMET, CBC, TSH, Free T4   Testing/Procedures: Your physician has requested that you have a renal artery duplex. During this test, an ultrasound is used to evaluate blood flow to the kidneys. Allow one hour for this exam. Do not eat after midnight the day before and avoid carbonated beverages. Take your medications as you usually do.--in December    Follow-Up: Your physician wants you to follow-up in: 12 months with Dr. Gwenlyn Found. You will receive a reminder letter in the mail two months in advance. If you don't receive a letter, please call our office to schedule the follow-up appointment.  If you need a refill on your cardiac medications before your next appointment, please call your pharmacy.

## 2015-12-26 NOTE — Progress Notes (Signed)
12/26/2015 Taylor Vaughn   [birth year]  [id number]  Primary Physician Howard Pouch, DO Primary Cardiologist: Lorretta Harp MD Renae Gloss  HPI:  [name] is a 82 year old moderately overweight widowed Caucasian female mother of 3 children, grandmother and 6 grandchildren who has a PhD in [place] and works as a Investment banker, operational. Her primary care physician is Dr. Ruby Cola. I last saw her in the office 03/10/15 I apparently did a STEMI on her late husband Taylor Vaughn in Jun 30, 2003. He since passed away..She was referred by Dr. Ena Dawley, cardiologist, for peripheral vascular evaluation to  evaluate her renal artery for potential intervention. Her cardiovascular risk factor profile is notable for hypertension and hyperlipidemia. She is not diabetic. She's never had a heart attack or stroke. She denies chest pain or shortness of breath. She had renal Dopplers done in 2009/06/29 here in our practice that showed moderate right renal artery stenosis with a renal aortic aortic ratio of 4.4. This had not changed on serial Dopplers after that. Her blood pressure seems to be well-controlled on 2 medications.    Current Outpatient Prescriptions  Medication Sig Dispense Refill  . amLODipine (NORVASC) 5 MG tablet Take 1 tablet (5 mg total) by mouth daily. 90 tablet 3  . Biotin 2500 MCG CAPS Take 1 tablet by mouth daily.    . Fish Oil-Coenzyme Q10 (FISH OIL PLUS CO Q-10) 1000-30 MG CAPS Take 1 tablet by mouth daily.    . metoprolol succinate (TOPROL-XL) 25 MG 24 hr tablet Take 1 tablet (25 mg total) by mouth daily. 90 tablet 3  . Misc Natural Products (TART CHERRY ADVANCED PO) Take 1,000 mg by mouth daily.    . Multiple Vitamin (MULTIVITAMIN) capsule Take 1 capsule by mouth daily.    . Multiple Vitamins-Minerals (CENTRUM SILVER ADULT 50+ PO) Take 1 tablet by mouth daily.    Edythe Lynn Lecithin 1200 MG CAPS Take 1,200 mg by mouth daily.     . vitamin E 400 UNIT capsule Take 400  Units by mouth daily.     No current facility-administered medications for this visit.     Allergies  Allergen Reactions  . Lisinopril Cough  . Statins     Muscle cramps, rashes, loss of hair  . Keflex [Cephalexin] Rash  . Losartan     phlegm    Social History   Social History  . Marital status: Widowed    Spouse name: N/A  . Number of children: N/A  . Years of education: N/A   Occupational History  . Not on file.   Social History Main Topics  . Smoking status: Former Smoker    Types: Cigarettes  . Smokeless tobacco: Never Used     Comment: smoked for 3 years in 06/30/22  . Alcohol use No  . Drug use: No  . Sexual activity: No   Other Topics Concern  . Not on file   Social History Narrative  . No narrative on file     Review of Systems: General: negative for chills, fever, night sweats or weight changes.  Cardiovascular: negative for chest pain, dyspnea on exertion, edema, orthopnea, palpitations, paroxysmal nocturnal dyspnea or shortness of breath Dermatological: negative for rash Respiratory: negative for cough or wheezing Urologic: negative for hematuria Abdominal: negative for nausea, vomiting, diarrhea, bright red blood per rectum, melena, or hematemesis Neurologic: negative for visual changes, syncope, or dizziness All other systems reviewed and are otherwise negative except as noted above.  Blood pressure (!) 156/76, pulse 66, height 5\' 3"  (1.6 m), weight 167 lb (75.8 kg).  General appearance: alert and no distress Neck: no adenopathy, no carotid bruit, no JVD, supple, symmetrical, trachea midline and thyroid not enlarged, symmetric, no tenderness/mass/nodules Lungs: clear to auscultation bilaterally Heart: regular rate and rhythm, S1, S2 normal, no murmur, click, rub or gallop Extremities: extremities normal, atraumatic, no cyanosis or edema  EKG normal sinus rhythm at 66 with reverse R-wave progression. I personally reviewed this EKG.  ASSESSMENT  AND PLAN:   Hyperlipidemia History of hyperlipidemia intolerant to statin drugs  Renal artery stenosis History of renal artery stenosis by duplex ultrasound last checked 02/21/15 with a right renal aortic ratio of 4.7. We will repeat renal Doppler studies.      Lorretta Harp MD FACP,FACC,FAHA, [facility] 12/26/2015 2:51 PM

## 2015-12-27 LAB — BASIC METABOLIC PANEL
BUN: 20 mg/dL (ref 7–25)
CHLORIDE: 105 mmol/L (ref 98–110)
CO2: 24 mmol/L (ref 20–31)
Calcium: 10.8 mg/dL — ABNORMAL HIGH (ref 8.6–10.4)
Creat: 0.85 mg/dL (ref 0.60–0.88)
GLUCOSE: 89 mg/dL (ref 65–99)
POTASSIUM: 4.6 mmol/L (ref 3.5–5.3)
SODIUM: 139 mmol/L (ref 135–146)

## 2015-12-27 LAB — CBC WITH DIFFERENTIAL/PLATELET
BASOS ABS: 90 {cells}/uL (ref 0–200)
Basophils Relative: 1 %
EOS ABS: 180 {cells}/uL (ref 15–500)
Eosinophils Relative: 2 %
HCT: 44.5 % (ref 35.0–45.0)
HEMOGLOBIN: 14.6 g/dL (ref 11.7–15.5)
LYMPHS ABS: 2070 {cells}/uL (ref 850–3900)
LYMPHS PCT: 23 %
MCH: 31.1 pg (ref 27.0–33.0)
MCHC: 32.8 g/dL (ref 32.0–36.0)
MCV: 94.7 fL (ref 80.0–100.0)
MONO ABS: 810 {cells}/uL (ref 200–950)
MPV: 10.7 fL (ref 7.5–12.5)
Monocytes Relative: 9 %
Neutro Abs: 5850 cells/uL (ref 1500–7800)
Neutrophils Relative %: 65 %
Platelets: 332 10*3/uL (ref 140–400)
RBC: 4.7 MIL/uL (ref 3.80–5.10)
RDW: 13.7 % (ref 11.0–15.0)
WBC: 9 10*3/uL (ref 3.8–10.8)

## 2015-12-27 LAB — TSH: TSH: 3.19 m[IU]/L

## 2015-12-27 LAB — T4, FREE: FREE T4: 1.1 ng/dL (ref 0.8–1.8)

## 2016-01-02 ENCOUNTER — Telehealth: Payer: Self-pay | Admitting: Cardiovascular Disease

## 2016-01-02 NOTE — Telephone Encounter (Signed)
New message      Pt is calling to get lab results.  She also want a copy of the labs and ekg mailed to her home.  OK to leave message on vm

## 2016-01-02 NOTE — Telephone Encounter (Signed)
12-26-15 lab results mailed to the patients home address.

## 2016-01-08 ENCOUNTER — Telehealth: Payer: Self-pay | Admitting: Cardiovascular Disease

## 2016-01-08 NOTE — Telephone Encounter (Signed)
New message  Pt received results of echo, shows she had a heart attack  Please return her call and discuss

## 2016-01-08 NOTE — Telephone Encounter (Signed)
Returned call to patient.She stated she received a copy of her recent lab work and ekg in mail this past Saturday.Stated she is very concerned ekg said she had had a anterolateral infarct.Stated no one ever told her she had a heart attack.Stated she feels fine. No chest pain.No sob.Stated she wanted to ask Dr.Berry if she needed to have a echo or stress test.Stated this is upsetting to her.Advised Dr.Berry out of office.I will send message to him for advice.

## 2016-01-09 NOTE — Telephone Encounter (Signed)
Spoke with patient, she is very upset and wants to know if she had a heart attack.  Explained that sometimes the EKG machine may interpret one thing but it is the doctor who will use his clinical judgement and expertise to interpret as well. She wants to know what Dr Gwenlyn Found thinks and if she needs further testing.  Reassured patient I will let her know as soon as Dr Gwenlyn Found responds.

## 2016-01-09 NOTE — Telephone Encounter (Signed)
I saw her for evaluation of renal artery stenosis. Her primary cardiologist Drs. Dr. Jolly Mango. Her EKG did show reversed R wave progression but this does not necessarily mean that she's had a myocardial infarction. Given the fact that she's had no symptoms of ischemic heart disease I do not think this needs further evaluation.

## 2016-01-09 NOTE — Telephone Encounter (Signed)
Returned call to patient Dr.Berry's recommendations given.She stated she told Dr.Berry at last office visit that she wanted him to be her primary cardiologist and he said ok.Advised I will let Dr.Berry know.

## 2016-01-10 NOTE — Telephone Encounter (Signed)
That’s fine with me

## 2016-02-08 ENCOUNTER — Encounter (HOSPITAL_COMMUNITY): Payer: Medicare Other

## 2016-02-09 ENCOUNTER — Ambulatory Visit (HOSPITAL_COMMUNITY)
Admission: RE | Admit: 2016-02-09 | Discharge: 2016-02-09 | Disposition: A | Discharge status: Home / Self Care | Payer: Medicare Other | Source: Ambulatory Visit | Attending: Cardiology | Admitting: Cardiology

## 2016-02-09 ENCOUNTER — Encounter (HOSPITAL_COMMUNITY): Payer: Medicare Other

## 2016-02-09 DIAGNOSIS — I701 Atherosclerosis of renal artery: Secondary | ICD-10-CM | POA: Insufficient documentation

## 2016-08-20 IMAGING — DX DG CHEST 2V
2 series · 2 of 2 positions shown · non-contrast
Comparison: 07/26/2011

CLINICAL DATA: Cough for 2 weeks.  Fever

EXAM:
CHEST  2 VIEW

[chest pa]
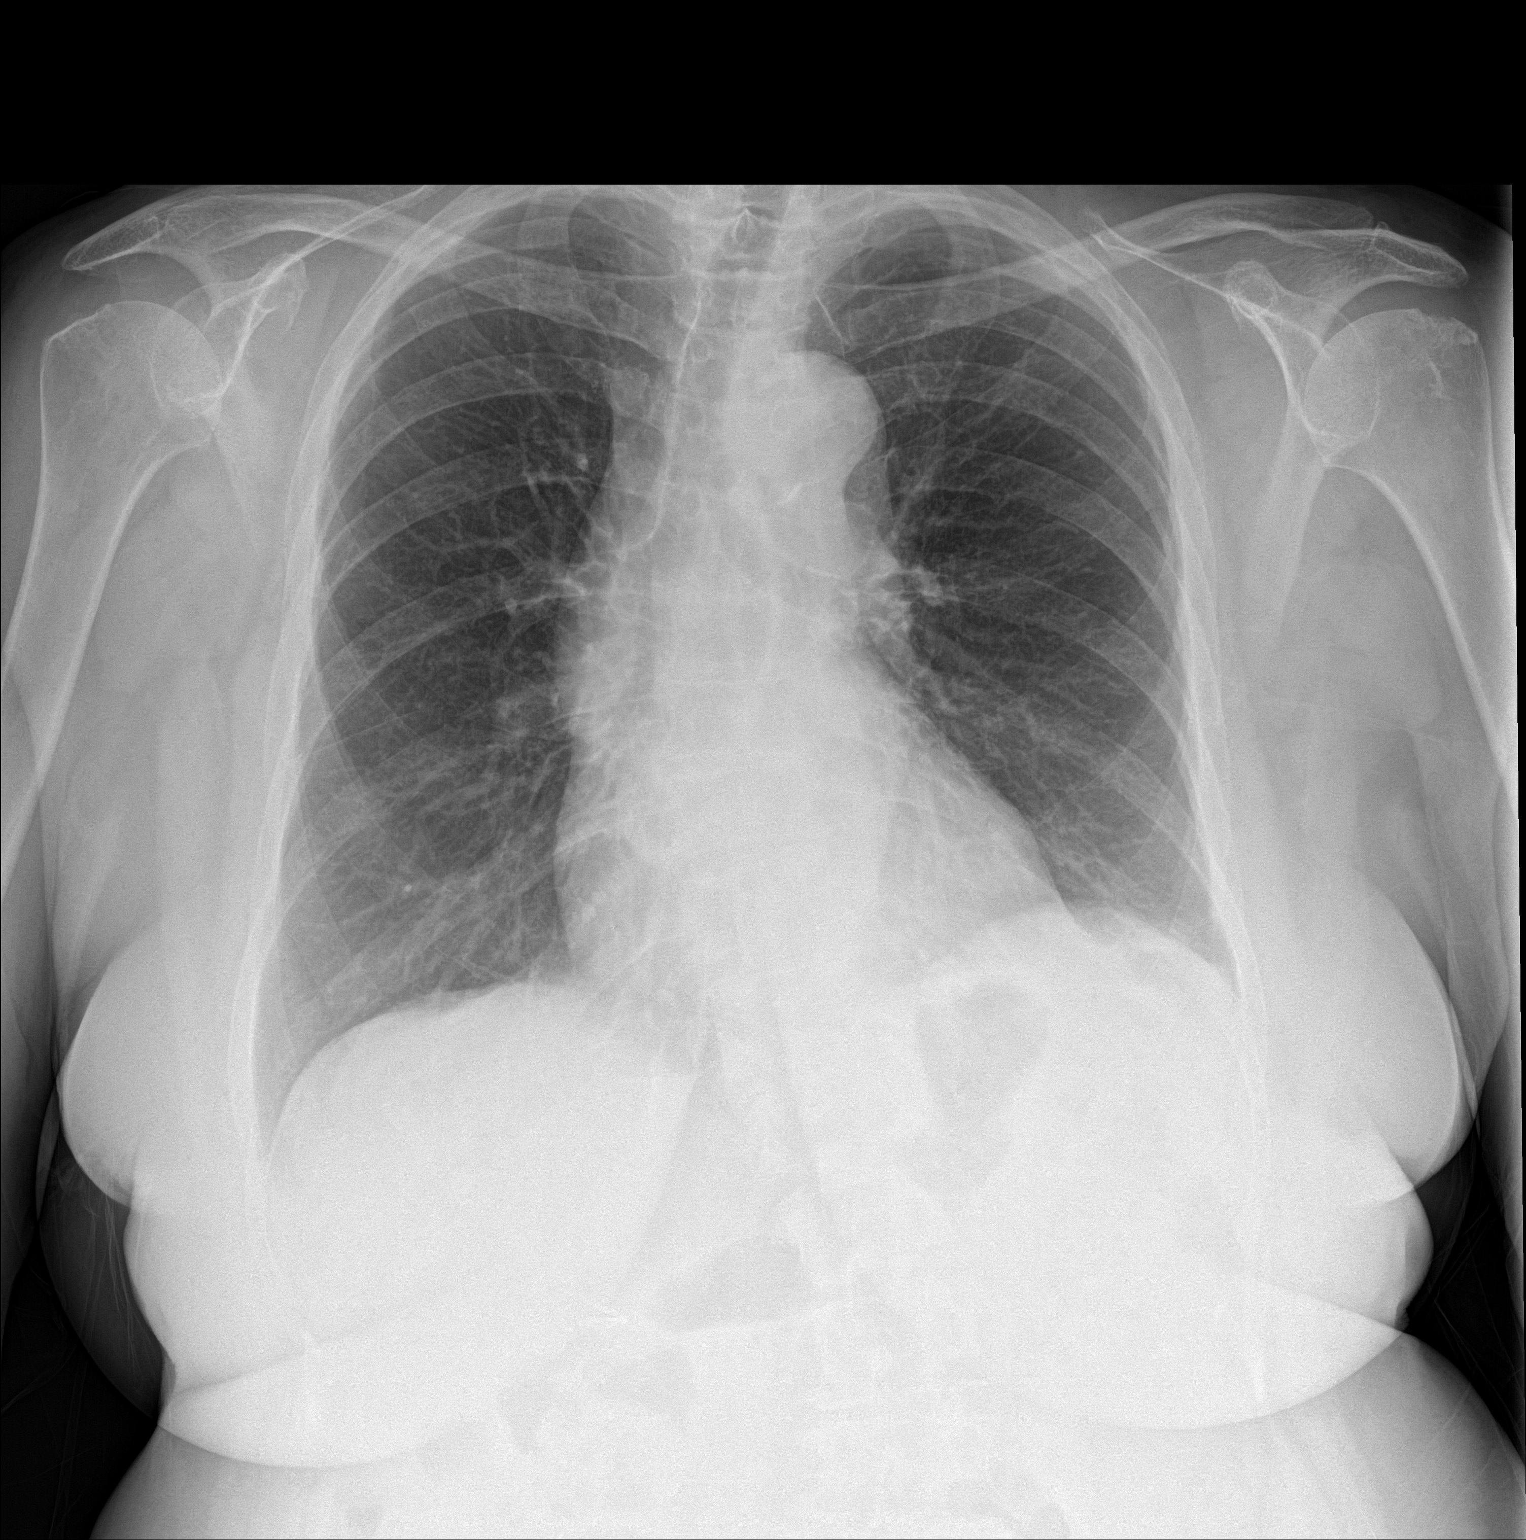

[chest lat]
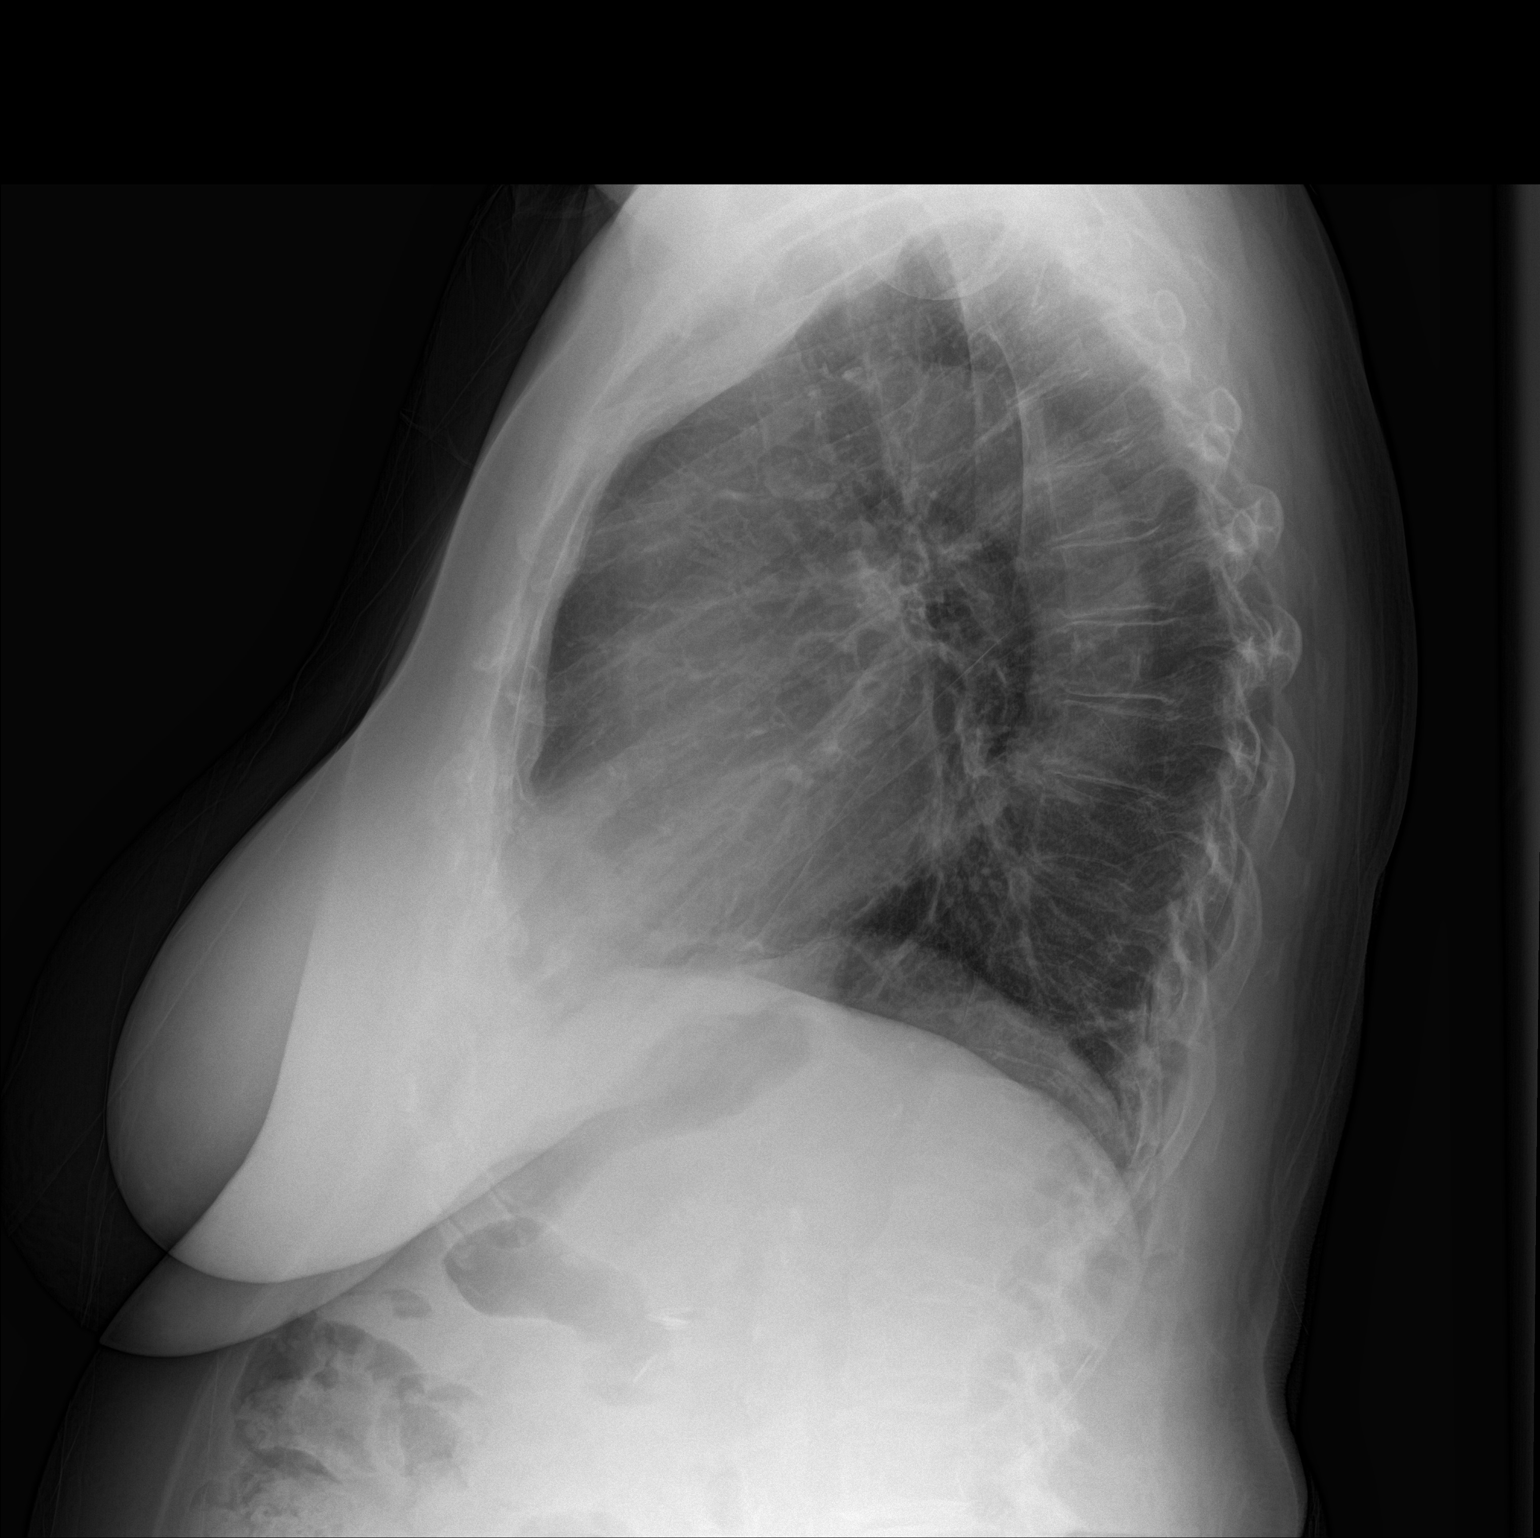

[2 of 2 positions shown; findings below may reference images not displayed]

FINDINGS: Normal heart size. There is no pleural effusion or edema. No
airspace consolidation. There is a scoliosis deformity involving the
thoracic spine.
IMPRESSION: 1. No acute cardiopulmonary abnormalities.
2. Scoliosis.

## 2016-11-06 ENCOUNTER — Ambulatory Visit (INDEPENDENT_AMBULATORY_CARE_PROVIDER_SITE_OTHER): Payer: Medicare Other | Admitting: Neurology

## 2016-11-06 ENCOUNTER — Encounter: Payer: Self-pay | Admitting: Neurology

## 2016-11-06 VITALS — BP 178/76 | HR 65 | Ht 63.0 in | Wt 169.8 lb

## 2016-11-06 DIAGNOSIS — R42 Dizziness and giddiness: Secondary | ICD-10-CM | POA: Diagnosis not present

## 2016-11-06 DIAGNOSIS — H8149 Vertigo of central origin, unspecified ear: Secondary | ICD-10-CM | POA: Diagnosis not present

## 2016-11-06 DIAGNOSIS — R269 Unspecified abnormalities of gait and mobility: Secondary | ICD-10-CM | POA: Diagnosis not present

## 2016-11-06 DIAGNOSIS — H814 Vertigo of central origin: Secondary | ICD-10-CM

## 2016-11-06 NOTE — Progress Notes (Signed)
PATIENT: Taylor Vaughn DOB: October 11, 1929  Chief Complaint  Patient presents with  . Dizziness    Orthostatic Vitals:  Lying: 178/76, 65, Sitting: 135/76, 69, Standing: 132/83, 68, Standing x 3 minutes: 158/84, 70.  History of vertigo. Reports two recent episodes of dizziness.  The first lasting two weeks and then one month later another event for three days.  She has had past success with the Epley Maneuver but her symptoms lasted longer with these two recent events.     HISTORICAL  Taylor Vaughn is a 83 years old right-handed female, seen in refer by her primary care doctor London Pepper for evaluation of dizziness, initial evaluation was on November 06 2016.  I reviewed and summarized the referring note, she has past medical history of hypertension, renal artery stenosis, hyperlipidemia, chronic insomnia,  She reported history of intermittent vertigo since sixties, intermittent, transient dizziness, lying on her right side, improved after holding still for a while, she has not has recurrent spell for a while,  She had sudden onset vertigo on September 14 2016, when she lie on her right side, she has transient dizziness, she has to close her eyes, holding still for few minutes symptoms last about 2 weeks, she has second recurrent spells of October 15 2016, similar episode, lasting for 4 days,   She denies lateralized motor or sensory deficit, but she noticed mild unsteady gait, today she has mild trunk ataxia to the right side.    we have personally reviewed MRI of the brain 2011, mild generalized atrophy supratentorium small vessel disease.   REVIEW OF SYSTEMS: Full 14 system review of systems performed and notable only for hearing loss, ringing ears, spinning sensation   ALLERGIES: Allergies  Allergen Reactions  . Lisinopril Cough  . Latex     She does not like the feel of Latex (or nail polish, make-up or perfume).  . Statins     Muscle cramps, rashes, loss of hair  . Keflex  [Cephalexin] Rash  . Losartan     phlegm    HOME MEDICATIONS: Current Outpatient Prescriptions  Medication Sig Dispense Refill  . amLODipine (NORVASC) 2.5 MG tablet Take 2.5 mg by mouth daily.    . Biotin 2500 MCG CAPS Take 1 tablet by mouth daily.    . Fish Oil-Coenzyme Q10 (FISH OIL PLUS CO Q-10) 1000-30 MG CAPS Take 1 tablet by mouth daily.    . metoprolol succinate (TOPROL-XL) 25 MG 24 hr tablet Take 1 tablet (25 mg total) by mouth daily. 90 tablet 3  . Misc Natural Products (TART CHERRY ADVANCED PO) Take 1,000 mg by mouth daily.    . Multiple Vitamin (MULTIVITAMIN) capsule Take 1 capsule by mouth daily.    . Multiple Vitamins-Minerals (CENTRUM SILVER ADULT 50+ PO) Take 1 tablet by mouth daily.    Edythe Lynn Lecithin 1200 MG CAPS Take 1,200 mg by mouth daily.      No current facility-administered medications for this visit.     PAST MEDICAL HISTORY: Past Medical History:  Diagnosis Date  . Acute chest wall pain 07/22/2011  . Adenomatous colon polyp    3 more polyps on colonoscopy 04/27/14 by [facility]; repeat colonoscopy 1 yr  . Allergic rhinitis 11/22/2011  . Candidal skin infection 10/03/2011  . Cataract 01/11/2013  . Cervical cancer screening 03/19/2012  . Chicken pox as a child  . Depression   . Diverticulosis 05/16/2013  . Grief reaction 10/03/2011  . Hyperlipidemia    statin intolerant  .  Hypertension   . Insomnia   . Measles 15  . Medicare annual wellness visit, subsequent 07/22/2011   Follows with Dr Erlene Quan of GI Follow with Dr Sallyanne Kuster of Cardiology   . MRSA (methicillin resistant staph aureus) culture positive 10/03/2011  . Mumps as a child  . Presbycusis of both ears 12/2013   with tinnitus of both ears (Dr. Erik Obey)  . Preventative health care 07/22/2011  . Renal artery stenosis ([place]) 11/24/2011   bilat, worse 12/2013, but no stenting of renal arteries is indicated as of 03/2014 per Dr. Gwenlyn Found.  Stable on f/u ultrasound 02/2015 per Dr. Kennon Holter f/u.  . Right  shoulder strain 10/03/2011  . Tinea corporis 11/15/2012   Under both breasts R>L, left foot.   . Urinary frequency 07/22/2011  . Vertigo   . Whooping cough 83 yrs old    PAST SURGICAL HISTORY: Past Surgical History:  Procedure Laterality Date  . CHOLECYSTECTOMY  1992  . colonoscopy with polypectomy  multiple; most recent 04/27/14   adenomas (many) +high grade dysplasia on most recent scope 03/2012; repeat 04/27/14 showed 3 more polyps; needs repeat colonoscopy 1 yr  . NM MYOCAR MULTIPLE W/SPECT  05/2009   EF 74%. NORMAL MYOCARDIAL PERFUSION STUDY.  Marland Kitchen RENAL DUPLEX  05/2011   RRA- 60-99% REDUCTION. LEFT MID + DISTAL RA- 1-59% DIAMETER REDUCTION BY VELOCITIES VS FIBROMUSCULAR DYSPLASIA WHICH CAN NOT BE EXCLUDED. R & L KIDNEYS- NORMAL.  Marland Kitchen SKIN BIOPSY     multiple, all benign    FAMILY HISTORY: Family History  Problem Relation Age of Onset  . Breast cancer Mother 80       remission, late 74's lung cancer  . Hypertension Mother        ?  . Lung cancer Mother   . Angina Father   . Diabetes type II Father   . Heart disease Father   . Obesity Sister   . Diabetes type II Sister   . Hyperlipidemia Sister   . Heart attack Sister   . Spina bifida Brother   . Constipation Daughter   . Hypertension Daughter   . Hyperlipidemia Daughter   . Obesity Son   . Hypertension Son   . Obesity Maternal Grandmother   . Heart attack Maternal Grandmother   . Stroke Paternal Grandfather     SOCIAL HISTORY:  Social History   Social History  . Marital status: Widowed    Spouse name: N/A  . Number of children: 3  . Years of education: College   Occupational History  . Retired    Social History Main Topics  . Smoking status: Former Smoker    Types: Cigarettes  . Smokeless tobacco: Never Used     Comment: smoked for 3 years in 30's  . Alcohol use No  . Drug use: No  . Sexual activity: No   Other Topics Concern  . Not on file   Social History Narrative   Lives at home alone.    Right-handed.   2 cups caffeine per day.     PHYSICAL EXAM   Vitals:   11/06/16 1425  BP: (!) 178/76  Pulse: 65  Weight: 169 lb 12 oz (77 kg)  Height: 5\' 3"  (1.6 m)    Not recorded      Body mass index is 30.07 kg/m.  PHYSICAL EXAMNIATION:  Gen: NAD, conversant, well nourised, obese, well groomed  Cardiovascular: Regular rate rhythm, no peripheral edema, warm, nontender. Eyes: Conjunctivae clear without exudates or hemorrhage Neck: Supple, no carotid bruits. Pulmonary: Clear to auscultation bilaterally   NEUROLOGICAL EXAM:  MENTAL STATUS: Speech:    Speech is normal; fluent and spontaneous with normal comprehension.  Cognition:     Orientation to time, place and person     Normal recent and remote memory     Normal Attention span and concentration     Normal Language, naming, repeating,spontaneous speech     Fund of knowledge   CRANIAL NERVES: CN II: Visual fields are full to confrontation. Fundoscopic exam is normal with sharp discs and no vascular changes. Pupils are round equal and briskly reactive to light. CN III, IV, VI: extraocular movement are normal. No ptosis. CN V: Facial sensation is intact to pinprick in all 3 divisions bilaterally. Corneal responses are intact.  CN VII: Face is symmetric with normal eye closure and smile. CN VIII: Hearing is normal to rubbing fingers CN IX, X: Palate elevates symmetrically. Phonation is normal. CN XI: Head turning and shoulder shrug are intact CN XII: Tongue is midline with normal movements and no atrophy.  MOTOR: There is no pronator drift of out-stretched arms. Muscle bulk and tone are normal. Muscle strength is normal.  REFLEXES: Reflexes are 2+ and symmetric at the biceps, triceps, knees, and ankles. Plantar responses are flexor.  SENSORY: Intact to light touch, pinprick, positional sensation and vibratory sensation are intact in fingers and toes.  COORDINATION: Mild trunk ataxia  leaning towards the right side,  There is no dysmetria on finger-to-nose and heel-knee-shin.    GAIT/STANCE: Posture is normal. Gait is steady with normal steps, base, arm swing, and turning. Heel and toe walking are normal. difficulty with tandem walking  Romberg is absent.   DIAGNOSTIC DATA (LABS, IMAGING, TESTING) - I reviewed patient records, labs, notes, testing and imaging myself where available.   ASSESSMENT AND PLAN  Taylor Vaughn is a 83 y.o. female   Trunk ataxia acute onset of vertigo   need to rule out brainstem/cerebellar stroke  MRI of the brain without contrast  Ultrasound of carotid artery  Aspirin daily  Increased watering take   Marcial Pacas, M.D. Ph.D.  [facility] [address], [place], [place] [address] Ph: [phone] Fax: [phone]  CC: London Pepper, MD

## 2016-11-17 ENCOUNTER — Other Ambulatory Visit: Payer: Medicare Other

## 2016-11-19 ENCOUNTER — Ambulatory Visit
Admission: RE | Admit: 2016-11-19 | Discharge: 2016-11-19 | Disposition: A | Discharge status: Home / Self Care | Payer: Medicare Other | Source: Ambulatory Visit | Attending: Neurology | Admitting: Neurology

## 2016-11-19 DIAGNOSIS — H814 Vertigo of central origin: Secondary | ICD-10-CM

## 2016-11-19 DIAGNOSIS — H8149 Vertigo of central origin, unspecified ear: Secondary | ICD-10-CM | POA: Diagnosis not present

## 2016-12-25 ENCOUNTER — Ambulatory Visit (INDEPENDENT_AMBULATORY_CARE_PROVIDER_SITE_OTHER): Payer: Medicare Other | Admitting: Cardiovascular Disease

## 2016-12-25 ENCOUNTER — Encounter: Payer: Self-pay | Admitting: Cardiovascular Disease

## 2016-12-25 VITALS — BP 162/85 | HR 65 | Ht 63.0 in | Wt 174.2 lb

## 2016-12-25 DIAGNOSIS — I10 Essential (primary) hypertension: Secondary | ICD-10-CM

## 2016-12-25 NOTE — Patient Instructions (Signed)
Medication Instructions: Your physician recommends that you continue on your current medications as directed. Please refer to the Current Medication list given to you today.   Follow-Up: Your physician recommends that you schedule a follow-up appointment as needed with Dr. Berry.    

## 2016-12-25 NOTE — Progress Notes (Signed)
12/25/2016 Taylor Vaughn   06-02-1929  [id number]  Primary Physician Koirala, Dibas, MD Primary Cardiologist: Lorretta Harp MD Lupe Carney, [place]  HPI:  Taylor Vaughn is a 83 y.o. female moderately overweight widowed Caucasian female mother of 3 children, grandmother and 6 grandchildren who has a PhD in [place] and works as a Investment banker, operational. Her primary care physician is Dr. Lujean Amel I last saw her in the office 12/26/15 I apparently did a STEMI on her late husband [name] in 06/02/2003. He since passed away..She was referred by Dr. Ena Dawley, cardiologist, for peripheral vascular evaluation to evaluate her renal artery for potential intervention. Her cardiovascular risk factor profile is notable for hypertension and hyperlipidemia. She is not diabetic. She's never had a heart attack or stroke. She denies chest pain or shortness of breath. She had renal Dopplers done in 01-Jun-2009 here in our practice that showed moderate right renal artery stenosis with a renal aortic aortic ratio of 4.4. Subsequently renal Dopplers have shown this to be significant only better than that. Her blood pressure has been moderately difficult to control although she is only on low-dose amlodipine and metoprolol.   Current Meds  Medication Sig  . amLODipine (NORVASC) 2.5 MG tablet Take 2.5 mg by mouth daily.  . Biotin 2500 MCG CAPS Take 1 tablet by mouth daily.  . Fish Oil-Coenzyme Q10 (FISH OIL PLUS CO Q-10) 1000-30 MG CAPS Take 1 tablet by mouth daily.  . metoprolol succinate (TOPROL-XL) 25 MG 24 hr tablet Take 1 tablet (25 mg total) by mouth daily.  . Misc Natural Products (TART CHERRY ADVANCED PO) Take 1,000 mg by mouth daily.  . Multiple Vitamin (MULTIVITAMIN) capsule Take 1 capsule by mouth daily.  . Multiple Vitamins-Minerals (CENTRUM SILVER ADULT 50+ PO) Take 1 tablet by mouth daily.  Edythe Lynn Lecithin 1200 MG CAPS Take 1,200 mg by mouth daily.      Allergies  Allergen  Reactions  . Lisinopril Cough  . Latex     She does not like the feel of Latex (or nail polish, make-up or perfume).  . Statins     Muscle cramps, rashes, loss of hair  . Keflex [Cephalexin] Rash  . Losartan     phlegm    Social History   Social History  . Marital status: Widowed    Spouse name: N/A  . Number of children: 3  . Years of education: College   Occupational History  . Retired    Social History Main Topics  . Smoking status: Former Smoker    Types: Cigarettes  . Smokeless tobacco: Never Used     Comment: smoked for 3 years in 2022/06/02  . Alcohol use No  . Drug use: No  . Sexual activity: No   Other Topics Concern  . Not on file   Social History Narrative   Lives at home alone.   Right-handed.   2 cups caffeine per day.     Review of Systems: General: negative for chills, fever, night sweats or weight changes.  Cardiovascular: negative for chest pain, dyspnea on exertion, edema, orthopnea, palpitations, paroxysmal nocturnal dyspnea or shortness of breath Dermatological: negative for rash Respiratory: negative for cough or wheezing Urologic: negative for hematuria Abdominal: negative for nausea, vomiting, diarrhea, bright red blood per rectum, melena, or hematemesis Neurologic: negative for visual changes, syncope, or dizziness All other systems reviewed and are otherwise negative except as noted above.    Blood pressure (!) 162/85, pulse  65, height 5\' 3"  (1.6 m), weight 174 lb 3.2 oz (79 kg).  General appearance: alert and no distress Neck: no adenopathy, no carotid bruit, no JVD, supple, symmetrical, trachea midline and thyroid not enlarged, symmetric, no tenderness/mass/nodules Lungs: clear to auscultation bilaterally Heart: regular rate and rhythm, S1, S2 normal, no murmur, click, rub or gallop Extremities: extremities normal, atraumatic, no cyanosis or edema Pulses: 2+ and symmetric Skin: Skin color, texture, turgor normal. No rashes or  lesions Neurologic: Alert and oriented X 3, normal strength and tone. Normal symmetric reflexes. Normal coordination and gait  EKG sinus rhythm at 65 with low limb voltage and left atrial enlargement. I personally reviewed this EKG.  ASSESSMENT AND PLAN:   Hypertension History of hypertension on low-dose amlodipine and metoprolol. Blood pressure today is 162/85. She checks it at home at least daily. Renal Dopplers performed 02/09/16 did not suggest physiologically significant renal artery stenosis. I see no reason to repeat these in the future. I will see her back when necessary.      Lorretta Harp MD FACP,FACC,FAHA, [facility] 12/25/2016 1:51 PM

## 2016-12-25 NOTE — Assessment & Plan Note (Signed)
History of hypertension on low-dose amlodipine and metoprolol. Blood pressure today is 162/85. She checks it at home at least daily. Renal Dopplers performed 02/09/16 did not suggest physiologically significant renal artery stenosis. I see no reason to repeat these in the future. I will see her back when necessary.

## 2017-01-07 ENCOUNTER — Ambulatory Visit: Payer: Medicare Other | Admitting: Neurology

## 2018-09-22 ENCOUNTER — Other Ambulatory Visit: Payer: Self-pay | Admitting: Family Medicine

## 2018-09-22 ENCOUNTER — Ambulatory Visit
Admission: RE | Admit: 2018-09-22 | Discharge: 2018-09-22 | Disposition: A | Discharge status: Home / Self Care | Payer: Medicare Other | Source: Ambulatory Visit | Attending: Family Medicine | Admitting: Family Medicine

## 2018-09-22 DIAGNOSIS — M25551 Pain in right hip: Secondary | ICD-10-CM

## 2018-09-22 DIAGNOSIS — W19XXXA Unspecified fall, initial encounter: Secondary | ICD-10-CM

## 2018-10-06 ENCOUNTER — Other Ambulatory Visit: Payer: Self-pay

## 2018-10-06 ENCOUNTER — Other Ambulatory Visit: Payer: Self-pay | Admitting: Family Medicine

## 2018-10-06 ENCOUNTER — Ambulatory Visit
Admission: RE | Admit: 2018-10-06 | Discharge: 2018-10-06 | Disposition: A | Discharge status: Home / Self Care | Payer: Medicare Other | Source: Ambulatory Visit | Attending: Family Medicine | Admitting: Family Medicine

## 2018-10-06 DIAGNOSIS — M545 Low back pain, unspecified: Secondary | ICD-10-CM

## 2018-11-25 ENCOUNTER — Encounter: Payer: Self-pay | Admitting: *Deleted

## 2018-11-26 ENCOUNTER — Telehealth: Payer: Self-pay | Admitting: *Deleted

## 2018-11-26 ENCOUNTER — Ambulatory Visit: Payer: PRIVATE HEALTH INSURANCE | Admitting: Neurology

## 2018-11-26 NOTE — Telephone Encounter (Signed)
The patient called to cancel her consultation appt same day.  States her daughter, who would be driving her to the appt, is sick.

## 2018-12-30 ENCOUNTER — Telehealth: Payer: Self-pay | Admitting: Neurology

## 2018-12-30 NOTE — Telephone Encounter (Signed)
lvm to r/s 11/24 appt

## 2019-01-04 ENCOUNTER — Encounter: Payer: Self-pay | Admitting: Neurology

## 2019-01-26 ENCOUNTER — Ambulatory Visit: Payer: Medicare Other | Admitting: Neurology

## 2019-01-31 ENCOUNTER — Emergency Department (HOSPITAL_COMMUNITY): Payer: Medicare Other

## 2019-01-31 ENCOUNTER — Other Ambulatory Visit: Payer: Self-pay

## 2019-01-31 ENCOUNTER — Inpatient Hospital Stay (HOSPITAL_COMMUNITY)
Admission: EM | Admit: 2019-01-31 | Discharge: 2019-02-22 | DRG: 177 | Disposition: A | Discharge status: Home Health | Payer: Medicare Other | Source: Skilled Nursing Facility | Attending: Internal Medicine | Admitting: Internal Medicine

## 2019-01-31 ENCOUNTER — Encounter (HOSPITAL_COMMUNITY): Payer: Self-pay

## 2019-01-31 DIAGNOSIS — R531 Weakness: Secondary | ICD-10-CM | POA: Diagnosis not present

## 2019-01-31 DIAGNOSIS — Z8614 Personal history of Methicillin resistant Staphylococcus aureus infection: Secondary | ICD-10-CM | POA: Diagnosis not present

## 2019-01-31 DIAGNOSIS — R05 Cough: Secondary | ICD-10-CM | POA: Diagnosis not present

## 2019-01-31 DIAGNOSIS — U071 COVID-19: Secondary | ICD-10-CM | POA: Diagnosis present

## 2019-01-31 DIAGNOSIS — Z9119 Patient's noncompliance with other medical treatment and regimen: Secondary | ICD-10-CM

## 2019-01-31 DIAGNOSIS — Z801 Family history of malignant neoplasm of trachea, bronchus and lung: Secondary | ICD-10-CM | POA: Diagnosis not present

## 2019-01-31 DIAGNOSIS — J1289 Other viral pneumonia: Secondary | ICD-10-CM | POA: Diagnosis not present

## 2019-01-31 DIAGNOSIS — M25561 Pain in right knee: Secondary | ICD-10-CM | POA: Diagnosis not present

## 2019-01-31 DIAGNOSIS — Z91040 Latex allergy status: Secondary | ICD-10-CM

## 2019-01-31 DIAGNOSIS — Z87891 Personal history of nicotine dependence: Secondary | ICD-10-CM

## 2019-01-31 DIAGNOSIS — R609 Edema, unspecified: Secondary | ICD-10-CM | POA: Diagnosis not present

## 2019-01-31 DIAGNOSIS — F321 Major depressive disorder, single episode, moderate: Secondary | ICD-10-CM | POA: Diagnosis not present

## 2019-01-31 DIAGNOSIS — R451 Restlessness and agitation: Secondary | ICD-10-CM | POA: Diagnosis not present

## 2019-01-31 DIAGNOSIS — Z7189 Other specified counseling: Secondary | ICD-10-CM | POA: Diagnosis not present

## 2019-01-31 DIAGNOSIS — I491 Atrial premature depolarization: Secondary | ICD-10-CM | POA: Diagnosis not present

## 2019-01-31 DIAGNOSIS — R6510 Systemic inflammatory response syndrome (SIRS) of non-infectious origin without acute organ dysfunction: Secondary | ICD-10-CM | POA: Diagnosis present

## 2019-01-31 DIAGNOSIS — R5381 Other malaise: Secondary | ICD-10-CM | POA: Diagnosis present

## 2019-01-31 DIAGNOSIS — I480 Paroxysmal atrial fibrillation: Secondary | ICD-10-CM | POA: Diagnosis present

## 2019-01-31 DIAGNOSIS — R627 Adult failure to thrive: Secondary | ICD-10-CM | POA: Diagnosis present

## 2019-01-31 DIAGNOSIS — M25461 Effusion, right knee: Secondary | ICD-10-CM | POA: Diagnosis present

## 2019-01-31 DIAGNOSIS — Z8349 Family history of other endocrine, nutritional and metabolic diseases: Secondary | ICD-10-CM

## 2019-01-31 DIAGNOSIS — E8809 Other disorders of plasma-protein metabolism, not elsewhere classified: Secondary | ICD-10-CM | POA: Diagnosis not present

## 2019-01-31 DIAGNOSIS — Z5329 Procedure and treatment not carried out because of patient's decision for other reasons: Secondary | ICD-10-CM | POA: Diagnosis not present

## 2019-01-31 DIAGNOSIS — Z823 Family history of stroke: Secondary | ICD-10-CM | POA: Diagnosis not present

## 2019-01-31 DIAGNOSIS — N179 Acute kidney failure, unspecified: Secondary | ICD-10-CM | POA: Diagnosis present

## 2019-01-31 DIAGNOSIS — R600 Localized edema: Secondary | ICD-10-CM | POA: Diagnosis not present

## 2019-01-31 DIAGNOSIS — F0391 Unspecified dementia with behavioral disturbance: Secondary | ICD-10-CM | POA: Diagnosis present

## 2019-01-31 DIAGNOSIS — I10 Essential (primary) hypertension: Secondary | ICD-10-CM | POA: Diagnosis present

## 2019-01-31 DIAGNOSIS — Z888 Allergy status to other drugs, medicaments and biological substances status: Secondary | ICD-10-CM

## 2019-01-31 DIAGNOSIS — Z8249 Family history of ischemic heart disease and other diseases of the circulatory system: Secondary | ICD-10-CM

## 2019-01-31 DIAGNOSIS — Z9049 Acquired absence of other specified parts of digestive tract: Secondary | ICD-10-CM | POA: Diagnosis not present

## 2019-01-31 DIAGNOSIS — R4589 Other symptoms and signs involving emotional state: Secondary | ICD-10-CM | POA: Diagnosis not present

## 2019-01-31 DIAGNOSIS — Z833 Family history of diabetes mellitus: Secondary | ICD-10-CM | POA: Diagnosis not present

## 2019-01-31 DIAGNOSIS — I440 Atrioventricular block, first degree: Secondary | ICD-10-CM | POA: Diagnosis present

## 2019-01-31 DIAGNOSIS — Z66 Do not resuscitate: Secondary | ICD-10-CM | POA: Diagnosis present

## 2019-01-31 DIAGNOSIS — J9601 Acute respiratory failure with hypoxia: Secondary | ICD-10-CM | POA: Diagnosis present

## 2019-01-31 DIAGNOSIS — R112 Nausea with vomiting, unspecified: Secondary | ICD-10-CM | POA: Diagnosis not present

## 2019-01-31 DIAGNOSIS — F03918 Unspecified dementia, unspecified severity, with other behavioral disturbance: Secondary | ICD-10-CM

## 2019-01-31 DIAGNOSIS — E876 Hypokalemia: Secondary | ICD-10-CM | POA: Diagnosis not present

## 2019-01-31 DIAGNOSIS — R9431 Abnormal electrocardiogram [ECG] [EKG]: Secondary | ICD-10-CM | POA: Diagnosis not present

## 2019-01-31 DIAGNOSIS — Z79899 Other long term (current) drug therapy: Secondary | ICD-10-CM

## 2019-01-31 DIAGNOSIS — R0602 Shortness of breath: Secondary | ICD-10-CM

## 2019-01-31 DIAGNOSIS — Z86010 Personal history of colonic polyps: Secondary | ICD-10-CM | POA: Diagnosis not present

## 2019-01-31 DIAGNOSIS — E875 Hyperkalemia: Secondary | ICD-10-CM | POA: Diagnosis not present

## 2019-01-31 DIAGNOSIS — D72825 Bandemia: Secondary | ICD-10-CM | POA: Diagnosis not present

## 2019-01-31 DIAGNOSIS — H9113 Presbycusis, bilateral: Secondary | ICD-10-CM | POA: Diagnosis present

## 2019-01-31 DIAGNOSIS — Z6829 Body mass index (BMI) 29.0-29.9, adult: Secondary | ICD-10-CM

## 2019-01-31 DIAGNOSIS — F329 Major depressive disorder, single episode, unspecified: Secondary | ICD-10-CM | POA: Diagnosis present

## 2019-01-31 DIAGNOSIS — E785 Hyperlipidemia, unspecified: Secondary | ICD-10-CM | POA: Diagnosis present

## 2019-01-31 DIAGNOSIS — R059 Cough, unspecified: Secondary | ICD-10-CM

## 2019-01-31 DIAGNOSIS — R7989 Other specified abnormal findings of blood chemistry: Secondary | ICD-10-CM | POA: Diagnosis not present

## 2019-01-31 DIAGNOSIS — Z803 Family history of malignant neoplasm of breast: Secondary | ICD-10-CM

## 2019-01-31 DIAGNOSIS — E669 Obesity, unspecified: Secondary | ICD-10-CM | POA: Diagnosis present

## 2019-01-31 DIAGNOSIS — Z515 Encounter for palliative care: Secondary | ICD-10-CM | POA: Diagnosis not present

## 2019-01-31 LAB — CBC WITH DIFFERENTIAL/PLATELET
Abs Immature Granulocytes: 0.03 10*3/uL (ref 0.00–0.07)
Basophils Absolute: 0 10*3/uL (ref 0.0–0.1)
Basophils Relative: 1 %
Eosinophils Absolute: 0 10*3/uL (ref 0.0–0.5)
Eosinophils Relative: 0 %
HCT: 47.9 % — ABNORMAL HIGH (ref 36.0–46.0)
Hemoglobin: 15.7 g/dL — ABNORMAL HIGH (ref 12.0–15.0)
Immature Granulocytes: 1 %
Lymphocytes Relative: 10 %
Lymphs Abs: 0.6 10*3/uL — ABNORMAL LOW (ref 0.7–4.0)
MCH: 31.4 pg (ref 26.0–34.0)
MCHC: 32.8 g/dL (ref 30.0–36.0)
MCV: 95.8 fL (ref 80.0–100.0)
Monocytes Absolute: 0.6 10*3/uL (ref 0.1–1.0)
Monocytes Relative: 10 %
Neutro Abs: 5 10*3/uL (ref 1.7–7.7)
Neutrophils Relative %: 78 %
Platelets: 263 10*3/uL (ref 150–400)
RBC: 5 MIL/uL (ref 3.87–5.11)
RDW: 13.4 % (ref 11.5–15.5)
WBC: 6.3 10*3/uL (ref 4.0–10.5)
nRBC: 0 % (ref 0.0–0.2)

## 2019-01-31 LAB — COMPREHENSIVE METABOLIC PANEL
ALT: 34 U/L (ref 0–44)
AST: 29 U/L (ref 15–41)
Albumin: 3.7 g/dL (ref 3.5–5.0)
Alkaline Phosphatase: 59 U/L (ref 38–126)
Anion gap: 9 (ref 5–15)
BUN: 19 mg/dL (ref 8–23)
CO2: 24 mmol/L (ref 22–32)
Calcium: 9.5 mg/dL (ref 8.9–10.3)
Chloride: 106 mmol/L (ref 98–111)
Creatinine, Ser: 0.72 mg/dL (ref 0.44–1.00)
GFR calc Af Amer: >60 mL/min (ref >60)
GFR calc non Af Amer: >60 mL/min (ref >60)
Glucose, Bld: 131 mg/dL — ABNORMAL HIGH (ref 70–99)
Potassium: 3.6 mmol/L (ref 3.5–5.1)
Sodium: 139 mmol/L (ref 135–145)
Total Bilirubin: 0.8 mg/dL (ref 0.3–1.2)
Total Protein: 6.9 g/dL (ref 6.5–8.1)

## 2019-01-31 LAB — POC SARS CORONAVIRUS 2 AG -  ED: SARS Coronavirus 2 Ag: POSITIVE — AB

## 2019-01-31 LAB — TROPONIN I (HIGH SENSITIVITY): Troponin I (High Sensitivity): 12 ng/L (ref <18)

## 2019-01-31 LAB — LACTIC ACID, PLASMA: Lactic Acid, Venous: 0.9 mmol/L (ref 0.5–1.9)

## 2019-01-31 NOTE — ED Notes (Signed)
Asked pt if she could give [place] a urine specimen but she said she does not need to go right now but when she does she will call us

## 2019-01-31 NOTE — ED Notes (Signed)
Pt not willing to attempt ambulation at this time.

## 2019-01-31 NOTE — ED Notes (Signed)
Attempted to ambulate pt but she said she was not getting up right now maybe she will try in the morning but she is too tired right now

## 2019-01-31 NOTE — ED Triage Notes (Signed)
BIB EMS from [facility]. Complaints of N/V/D fever, cough x 2 days.  Family and care giver reports lethargic and irritability. 90% on RA. Lung sounds are clear with EMS. Facility reports Covid outbreak. A/0 X3  180/93 HR 60 RR 36 97.9   20g IV  4mg  Zofran   tylenol given at 2000 with facility

## 2019-01-31 NOTE — ED Provider Notes (Signed)
[facility] DEPT Provider Note   CSN: [id number] Arrival date & time: 01/31/19  2030     History   Chief Complaint Chief Complaint  Patient presents with  . Nausea  . Emesis  . Diarrhea  . Cough    HPI Taylor Vaughn is a 85 y.o. female.  Presents emergency department for multiple complaints.  Comes from [facility].  Reports has had intermittent nausea, vomiting, diarrhea, fever and cough for the past 2 days.  States that time of interview she has no complaints, specifically denies any chest pain or abdominal pain.  Has had occasional cough, nonproductive, non bloody.  Had noted some shortness of breath but currently not feeling short of breath.  States has had difficulty getting around due to decreased energy, does not want to try ambulating currently.  Family reported patient frequently forgetful.   Facility reported Covid outbreak.  Patient has not been tested for COVID-19.       HPI  Past Medical History:  Diagnosis Date  . Acute chest wall pain 07/22/2011  . Adenomatous colon polyp    3 more polyps on colonoscopy 04/27/14 by [facility]; repeat colonoscopy 1 yr  . Allergic rhinitis 11/22/2011  . Candidal skin infection 10/03/2011  . Cataract 01/11/2013  . Cervical cancer screening 03/19/2012  . Chicken pox as a child  . Depression   . Diverticulosis 05/16/2013  . Grief reaction 10/03/2011  . Hyperlipidemia    statin intolerant  . Hypertension   . Insomnia   . Measles 15  . Medicare annual wellness visit, subsequent 07/22/2011   Follows with Dr Erlene Quan of GI Follow with Dr Sallyanne Kuster of Cardiology   . Memory loss   . MRSA (methicillin resistant staph aureus) culture positive 10/03/2011  . Mumps as a child  . Presbycusis of both ears 12/2013   with tinnitus of both ears (Dr. Erik Obey)  . Preventative health care 07/22/2011  . RAS (renal artery stenosis) ([place])   . Renal artery stenosis ([place]) 11/24/2011   bilat, worse 12/2013,  but no stenting of renal arteries is indicated as of 03/2014 per Dr. Gwenlyn Found.  Stable on f/u ultrasound 02/2015 per Dr. Kennon Holter f/u.  . Right shoulder strain 10/03/2011  . Tinea corporis 11/15/2012   Under both breasts R>L, left foot.   . Urinary frequency 07/22/2011  . Vertigo   . Whooping cough 85 yrs old    Patient Active Problem List   Diagnosis Date Noted  . Vertigo 11/06/2016  . Gait abnormality 11/06/2016  . Sciatica associated with disorder of lumbar spine 05/29/2015  . Right leg pain 05/02/2015  . Right knee pain 05/02/2015  . Lesion of skin of face 05/02/2015  . Maxillary sinusitis, acute 03/14/2015  . Cough 03/14/2015  . Left knee injury 10/21/2014  . Cataract 01/11/2013  . Tinea corporis 11/15/2012  . Cervical cancer screening 03/19/2012  . Renal artery stenosis ([place]) 11/24/2011  . Allergic rhinitis 11/22/2011  . Right shoulder strain 10/03/2011  . MRSA (methicillin resistant staph aureus) culture positive 10/03/2011  . Grief reaction 10/03/2011  . Candidal skin infection 10/03/2011  . Urinary frequency 07/22/2011  . Acute chest wall pain 07/22/2011  . Medicare annual wellness visit, subsequent 07/22/2011  . Hypertension   . Hyperlipidemia   . Insomnia   . Colon polyps     Past Surgical History:  Procedure Laterality Date  . CHOLECYSTECTOMY  1992  . colonoscopy with polypectomy  multiple; most recent 04/27/14   adenomas (many) +  high grade dysplasia on most recent scope 03/2012; repeat 04/27/14 showed 3 more polyps; needs repeat colonoscopy 1 yr  . NM MYOCAR MULTIPLE W/SPECT  05/2009   EF 74%. NORMAL MYOCARDIAL PERFUSION STUDY.  Marland Kitchen RENAL DUPLEX  05/2011   RRA- 60-99% REDUCTION. LEFT MID + DISTAL RA- 1-59% DIAMETER REDUCTION BY VELOCITIES VS FIBROMUSCULAR DYSPLASIA WHICH CAN NOT BE EXCLUDED. R & L KIDNEYS- NORMAL.  Marland Kitchen SKIN BIOPSY     multiple, all benign     OB History   No obstetric history on file.      Home Medications    Prior to Admission medications    Medication Sig Start Date End Date Taking? Authorizing Provider  Fish Oil-Coenzyme Q10 (FISH OIL PLUS CO Q-10) 1000-30 MG CAPS Take 1 tablet by mouth daily.    [provider]  metoprolol succinate (TOPROL-XL) 25 MG 24 hr tablet Take 75 mg by mouth daily.    [provider]  Multiple Vitamins-Minerals (CENTRUM SILVER ADULT 50+ PO) Take 1 tablet by mouth daily.    [provider]    Family History Family History  Problem Relation Age of Onset  . Breast cancer Mother 52       remission, late 78's lung cancer  . Hypertension Mother        ?  . Lung cancer Mother   . Angina Father   . Diabetes type II Father   . Heart disease Father   . Obesity Sister   . Diabetes type II Sister   . Hyperlipidemia Sister   . Heart attack Sister   . Spina bifida Brother   . Constipation Daughter   . Hypertension Daughter   . Hyperlipidemia Daughter   . Obesity Son   . Hypertension Son   . Obesity Maternal Grandmother   . Heart attack Maternal Grandmother   . Stroke Paternal Grandfather     Social History Social History   Tobacco Use  . Smoking status: Former Smoker    Types: Cigarettes  . Smokeless tobacco: Never Used  . Tobacco comment: smoked for 3 years in 30's  Substance Use Topics  . Alcohol use: No  . Drug use: No     Allergies   Lisinopril, Latex, Statins, Keflex [cephalexin], and Losartan   Review of Systems Review of Systems  Constitutional: Positive for fever. Negative for chills.  HENT: Negative for ear pain and sore throat.   Eyes: Negative for pain and visual disturbance.  Respiratory: Positive for cough. Negative for shortness of breath.   Cardiovascular: Negative for chest pain and palpitations.  Gastrointestinal: Positive for abdominal pain, diarrhea and vomiting.  Genitourinary: Negative for dysuria and hematuria.  Musculoskeletal: Negative for arthralgias and back pain.  Skin: Negative for color change and rash.  Neurological:  Negative for seizures and syncope.  All other systems reviewed and are negative.    Physical Exam Updated Vital Signs BP (!) 154/69   Pulse 67   Temp (!) 101.5 F (38.6 C) (Oral)   Resp (!) 30   Ht 5\' 6"  (1.676 m)   Wt 81.6 kg   SpO2 93%   BMI 29.05 kg/m   Physical Exam Vitals signs and nursing note reviewed.  Constitutional:      General: She is not in acute distress.    Appearance: She is well-developed.  HENT:     Head: Normocephalic and atraumatic.  Eyes:     Conjunctiva/sclera: Conjunctivae normal.  Neck:     Musculoskeletal: Neck supple.  Cardiovascular:     Rate and Rhythm: Normal rate and regular rhythm.     Heart sounds: No murmur.  Pulmonary:     Effort: No respiratory distress.     Comments: Mild tachypnea, no distress, somewhat coarse breath sounds bilaterally Abdominal:     General: Bowel sounds are normal. There is no distension.     Palpations: Abdomen is soft.     Tenderness: There is no abdominal tenderness. There is no guarding.  Skin:    General: Skin is warm and dry.  Neurological:     General: No focal deficit present.     Mental Status: She is alert and oriented to person, place, and time.     Comments: Occasionally forgetful, oriented to person, place, time      ED Treatments / Results  Labs (all labs ordered are listed, but only abnormal results are displayed) Labs Reviewed  CBC WITH DIFFERENTIAL/PLATELET - Abnormal; Notable for the following components:      Result Value   Hemoglobin 15.7 (*)    HCT 47.9 (*)    Lymphs Abs 0.6 (*)    All other components within normal limits  COMPREHENSIVE METABOLIC PANEL - Abnormal; Notable for the following components:   Glucose, Bld 131 (*)    All other components within normal limits  POC SARS CORONAVIRUS 2 AG -  ED - Abnormal; Notable for the following components:   SARS Coronavirus 2 Ag POSITIVE (*)    All other components within normal limits  CULTURE, BLOOD (ROUTINE X 2)  CULTURE, BLOOD  (ROUTINE X 2)  LACTIC ACID, PLASMA  LACTIC ACID, PLASMA  URINALYSIS, ROUTINE W REFLEX MICROSCOPIC  TROPONIN I (HIGH SENSITIVITY)  TROPONIN I (HIGH SENSITIVITY)    EKG EKG Interpretation  Date/Time:  Sunday January 31 2019 20:51:21 EST Ventricular Rate:  70 PR Interval:    QRS Duration: 120 QT Interval:  413 QTC Calculation: 446 R Axis:   31 Text Interpretation: Sinus rhythm Consider left atrial enlargement Nonspecific intraventricular conduction delay Inferior infarct, old Confirmed by Madalyn Rob [phone]) on 01/31/2019 10:12:11 PM   Radiology Dg Chest Portable 1 View  Result Date: 01/31/2019 CLINICAL DATA:  Cough, fever EXAM: PORTABLE CHEST 1 VIEW COMPARISON:  03/14/2015 FINDINGS: Heart and mediastinal contours are within normal limits. No focal opacities or effusions. No acute bony abnormality. IMPRESSION: No active cardiopulmonary disease. Electronically Signed   By: Rolm Baptise M.D.   On: 01/31/2019 21:45    Procedures .Critical Care Performed by: Lucrezia Starch, MD Authorized by: Lucrezia Starch, MD   Critical care provider statement:    Critical care time (minutes):  34   Critical care was necessary to treat or prevent imminent or life-threatening deterioration of the following conditions:  Sepsis and respiratory failure   Critical care was time spent personally by me on the following activities:  Discussions with consultants, evaluation of patient's response to treatment, examination of patient, ordering and performing treatments and interventions, ordering and review of laboratory studies, ordering and review of radiographic studies, pulse oximetry, re-evaluation of patient's condition, obtaining history from patient or surrogate and review of old charts   (including critical care time)  Medications Ordered in ED Medications - No data to display   Initial Impression / Assessment and Plan / ED Course  I have reviewed the triage vital signs and the  nursing notes.  Pertinent labs & imaging results that were available during my care of the patient were reviewed by me and  considered in my medical decision making (see chart for details).        85 year old lady HTN, HL presented to ER with multiple complaints.  Consolation of symptoms concerning for possible COVID-19.  COVID-19 POC swab was positive.  There was report she had abdominal pain, patient denied any ongoing abdominal pain but had soft abdomen.  Normal LFTs, no white count, doubt acute abdominal process.  Suspect symptoms and fever related to COVID-19.  Family reported she has not been walking lately on her own which is a new problem for her.  Patient refused to attempt ambulation trial.  While on room air, patient dropped to 89%.  No acute ischemic ekg changes, trop wnl, no chest pain, low suspicion for ACS as etiology. Will admit for further management.  Will consult hospitalist for admission.   Taylor Vaughn was evaluated in Emergency Department on 01/31/2019 for the symptoms described in the history of present illness. She was evaluated in the context of the global COVID-19 pandemic, which necessitated consideration that the patient might be at risk for infection with the SARS-CoV-2 virus that causes COVID-19. Institutional protocols and algorithms that pertain to the evaluation of patients at risk for COVID-19 are in a state of rapid change based on information released by regulatory bodies including the CDC and federal and state organizations. These policies and algorithms were followed during the patient's care in the ED.   Final Clinical Impressions(s) / ED Diagnoses   Final diagnoses:  COVID-19  Acute respiratory failure with hypoxia [facility])    ED Discharge Orders    None       Lucrezia Starch, MD 01/31/19 2330

## 2019-01-31 NOTE — ED Provider Notes (Addendum)
85 year old female received a signout from Dr. Roslynn Amble pending admission.  Please see his note for further work-up and medical decision making.  In brief, the patient has been having nausea, vomiting, diarrhea, fever, and cough for the last 2 days.  She has been feeling very fatigued and has refused to ambulate in the ER.  She had a positive Covid test.  There is a known COVID-19 outbreak at her independent living facility at [facility].  Ambulation was attempted in the ER and she desatted to 89%.  She received a Tylenol at 2000.  Hospitalist team was consulted and Dr. Hal Hope will accept the patient for admission. The patient appears reasonably stabilized for admission considering the current resources, flow, and capabilities available in the ED at this time, and I doubt any other [facility] requiring further screening and/or treatment in the ED prior to admission.    Joline Maxcy A, PA-C 01/31/19 2343    Joline Maxcy A, PA-C 01/31/19 [id number]    Lucrezia Starch, MD 02/01/19 [phone]

## 2019-01-31 NOTE — ED Notes (Signed)
Upon assessment pt is not a good historian and is forgetful. Asked multiple times the time of day. Am or PM

## 2019-02-01 ENCOUNTER — Encounter (HOSPITAL_COMMUNITY): Payer: Self-pay | Admitting: Internal Medicine

## 2019-02-01 ENCOUNTER — Other Ambulatory Visit: Payer: Self-pay

## 2019-02-01 ENCOUNTER — Inpatient Hospital Stay (HOSPITAL_COMMUNITY): Payer: Medicare Other

## 2019-02-01 DIAGNOSIS — R6510 Systemic inflammatory response syndrome (SIRS) of non-infectious origin without acute organ dysfunction: Secondary | ICD-10-CM | POA: Diagnosis present

## 2019-02-01 LAB — CBC WITH DIFFERENTIAL/PLATELET
Abs Immature Granulocytes: 0.04 10*3/uL (ref 0.00–0.07)
Basophils Absolute: 0 10*3/uL (ref 0.0–0.1)
Basophils Relative: 0 %
Eosinophils Absolute: 0 10*3/uL (ref 0.0–0.5)
Eosinophils Relative: 0 %
HCT: 45.3 % (ref 36.0–46.0)
Hemoglobin: 14.7 g/dL (ref 12.0–15.0)
Immature Granulocytes: 1 %
Lymphocytes Relative: 14 %
Lymphs Abs: 0.8 10*3/uL (ref 0.7–4.0)
MCH: 31.9 pg (ref 26.0–34.0)
MCHC: 32.5 g/dL (ref 30.0–36.0)
MCV: 98.3 fL (ref 80.0–100.0)
Monocytes Absolute: 0.8 10*3/uL (ref 0.1–1.0)
Monocytes Relative: 13 %
Neutro Abs: 4.4 10*3/uL (ref 1.7–7.7)
Neutrophils Relative %: 72 %
Platelets: 245 10*3/uL (ref 150–400)
RBC: 4.61 MIL/uL (ref 3.87–5.11)
RDW: 13.4 % (ref 11.5–15.5)
WBC: 6.1 10*3/uL (ref 4.0–10.5)
nRBC: 0 % (ref 0.0–0.2)

## 2019-02-01 LAB — COMPREHENSIVE METABOLIC PANEL
ALT: 30 U/L (ref 0–44)
AST: 24 U/L (ref 15–41)
Albumin: 3.1 g/dL — ABNORMAL LOW (ref 3.5–5.0)
Alkaline Phosphatase: 52 U/L (ref 38–126)
Anion gap: 9 (ref 5–15)
BUN: 20 mg/dL (ref 8–23)
CO2: 26 mmol/L (ref 22–32)
Calcium: 9.2 mg/dL (ref 8.9–10.3)
Chloride: 104 mmol/L (ref 98–111)
Creatinine, Ser: 0.78 mg/dL (ref 0.44–1.00)
GFR calc Af Amer: >60 mL/min (ref >60)
GFR calc non Af Amer: >60 mL/min (ref >60)
Glucose, Bld: 128 mg/dL — ABNORMAL HIGH (ref 70–99)
Potassium: 3.6 mmol/L (ref 3.5–5.1)
Sodium: 139 mmol/L (ref 135–145)
Total Bilirubin: 0.4 mg/dL (ref 0.3–1.2)
Total Protein: 6 g/dL — ABNORMAL LOW (ref 6.5–8.1)

## 2019-02-01 LAB — MRSA PCR SCREENING: MRSA by PCR: NEGATIVE

## 2019-02-01 LAB — ABO/RH: ABO/RH(D): A NEG

## 2019-02-01 LAB — C-REACTIVE PROTEIN: CRP: <0.8 mg/dL (ref <1.0)

## 2019-02-01 LAB — TROPONIN I (HIGH SENSITIVITY): Troponin I (High Sensitivity): 13 ng/L (ref <18)

## 2019-02-01 LAB — FERRITIN: Ferritin: 367 ng/mL — ABNORMAL HIGH (ref 11–307)

## 2019-02-01 LAB — LACTIC ACID, PLASMA: Lactic Acid, Venous: 1 mmol/L (ref 0.5–1.9)

## 2019-02-01 LAB — D-DIMER, QUANTITATIVE: D-Dimer, Quant: 0.55 ug/mL-FEU — ABNORMAL HIGH (ref 0.00–0.50)

## 2019-02-01 MED ORDER — SODIUM CHLORIDE 0.9 % IV SOLN
100.0000 mg | INTRAVENOUS | Status: DC
  Filled 2019-02-01 (×2): qty 20

## 2019-02-01 MED ORDER — ACETAMINOPHEN 325 MG PO TABS
650.0000 mg | ORAL_TABLET | Freq: Four times a day (QID) | ORAL | Status: DC | PRN
  Administered 2019-02-06 – 2019-02-11 (×3): 650 mg via ORAL
  Filled 2019-02-01 (×4): qty 2

## 2019-02-01 MED ORDER — SODIUM CHLORIDE 0.9 % IV SOLN
200.0000 mg | Freq: Once | INTRAVENOUS | Status: AC
  Administered 2019-02-01: 200 mg via INTRAVENOUS
  Filled 2019-02-01: qty 40

## 2019-02-01 MED ORDER — ACETAMINOPHEN 650 MG RE SUPP: 650.0000 mg | Freq: Four times a day (QID) | RECTAL | Status: DC | PRN

## 2019-02-01 MED ORDER — DEXAMETHASONE 4 MG PO TABS
6.0000 mg | ORAL_TABLET | Freq: Every day | ORAL | Status: DC
  Administered 2019-02-01 – 2019-02-04 (×4): 6 mg via ORAL
  Filled 2019-02-01 (×6): qty 2

## 2019-02-01 MED ORDER — ENOXAPARIN SODIUM 40 MG/0.4ML ~~LOC~~ SOLN
40.0000 mg | Freq: Every day | SUBCUTANEOUS | Status: DC
  Administered 2019-02-01 – 2019-02-18 (×13): 40 mg via SUBCUTANEOUS
  Filled 2019-02-01 (×18): qty 0.4

## 2019-02-01 MED ORDER — ONDANSETRON HCL 4 MG/2ML IJ SOLN: 4.0000 mg | Freq: Four times a day (QID) | INTRAMUSCULAR | Status: DC | PRN

## 2019-02-01 MED ORDER — ONDANSETRON HCL 4 MG PO TABS: 4.0000 mg | ORAL_TABLET | Freq: Four times a day (QID) | ORAL | Status: DC | PRN

## 2019-02-01 MED ORDER — AMLODIPINE BESYLATE 5 MG PO TABS
2.5000 mg | ORAL_TABLET | Freq: Every day | ORAL | Status: DC
  Administered 2019-02-01 – 2019-02-05 (×5): 2.5 mg via ORAL
  Filled 2019-02-01 (×6): qty 1

## 2019-02-01 MED ORDER — DONEPEZIL HCL 5 MG PO TABS
5.0000 mg | ORAL_TABLET | Freq: Every day | ORAL | Status: DC
  Administered 2019-02-01 – 2019-02-22 (×19): 5 mg via ORAL
  Filled 2019-02-01 (×23): qty 1

## 2019-02-01 NOTE — H&P (Signed)
History and Physical    Taylor Vaughn [id number] DOB: 17-Jan-1930 DOA: 01/31/2019  PCP: Lujean Amel, MD  Patient coming from: [facility] living facility.  History obtained from patient's daughter.  Patient has memory issues.  Chief Complaint: Weakness nausea vomiting and diarrhea.  HPI: Taylor Vaughn is a 85 y.o. female with history of hypertension and memory issues on Aricept and amlodipine over the last 2 to 3 days has been feeling more weak than usual.  As per the patient's daughter patient has been gradually decreasing activity and become more bedbound.  Over the last 2 days patient has benign nausea vomiting and diarrhea and feeling very weak to the point that she is no edema able to get onto the commode and get out.  Patient lives in independent living facility.  Patient was brought to the ER for further management and constellation of symptoms were concerning for COVID-19.  ED Course: In the ER COVID-19 was positive.  Labs show creatinine 0.7 LFTs normal hemoglobin 15.7 platelets 263 high-sensitivity troponin of 12.  Chest x-ray was unremarkable CT head is unremarkable.  EKG shows normal sinus rhythm.  Patient was admitted for Covid infection with progressive weakness.  On my exam patient is alert awake oriented to name moves all extremities while lying on the bed.  Review of Systems: As per HPI, rest all negative.   Past Medical History:  Diagnosis Date  . Acute chest wall pain 07/22/2011  . Adenomatous colon polyp    3 more polyps on colonoscopy 04/27/14 by [facility]; repeat colonoscopy 1 yr  . Allergic rhinitis 11/22/2011  . Candidal skin infection 10/03/2011  . Cataract 01/11/2013  . Cervical cancer screening 03/19/2012  . Chicken pox as a child  . Depression   . Diverticulosis 05/16/2013  . Grief reaction 10/03/2011  . Hyperlipidemia    statin intolerant  . Hypertension   . Insomnia   . Measles 15  . Medicare annual wellness visit, subsequent  07/22/2011   Follows with Dr Erlene Quan of GI Follow with Dr Sallyanne Kuster of Cardiology   . Memory loss   . MRSA (methicillin resistant staph aureus) culture positive 10/03/2011  . Mumps as a child  . Presbycusis of both ears 12/2013   with tinnitus of both ears (Dr. Erik Obey)  . Preventative health care 07/22/2011  . RAS (renal artery stenosis) ([place])   . Renal artery stenosis ([place]) 11/24/2011   bilat, worse 12/2013, but no stenting of renal arteries is indicated as of 03/2014 per Dr. Gwenlyn Found.  Stable on f/u ultrasound 02/2015 per Dr. Kennon Holter f/u.  . Right shoulder strain 10/03/2011  . Tinea corporis 11/15/2012   Under both breasts R>L, left foot.   . Urinary frequency 07/22/2011  . Vertigo   . Whooping cough 85 yrs old    Past Surgical History:  Procedure Laterality Date  . CHOLECYSTECTOMY  1992  . colonoscopy with polypectomy  multiple; most recent 04/27/14   adenomas (many) +high grade dysplasia on most recent scope 03/2012; repeat 04/27/14 showed 3 more polyps; needs repeat colonoscopy 1 yr  . NM MYOCAR MULTIPLE W/SPECT  05/2009   EF 74%. NORMAL MYOCARDIAL PERFUSION STUDY.  Marland Kitchen RENAL DUPLEX  05/2011   RRA- 60-99% REDUCTION. LEFT MID + DISTAL RA- 1-59% DIAMETER REDUCTION BY VELOCITIES VS FIBROMUSCULAR DYSPLASIA WHICH CAN NOT BE EXCLUDED. R & L KIDNEYS- NORMAL.  Marland Kitchen SKIN BIOPSY     multiple, all benign     reports that she has quit smoking. Her smoking use included  cigarettes. She has never used smokeless tobacco. She reports that she does not drink alcohol or use drugs.  Allergies  Allergen Reactions  . Lisinopril Cough  . Latex     She does not like the feel of Latex (or nail polish, make-up or perfume).  . Statins     Muscle cramps, rashes, loss of hair  . Keflex [Cephalexin] Rash  . Losartan     phlegm    Family History  Problem Relation Age of Onset  . Breast cancer Mother 81       remission, late 27's lung cancer  . Hypertension Mother        ?  . Lung cancer Mother   . Angina Father    . Diabetes type II Father   . Heart disease Father   . Obesity Sister   . Diabetes type II Sister   . Hyperlipidemia Sister   . Heart attack Sister   . Spina bifida Brother   . Constipation Daughter   . Hypertension Daughter   . Hyperlipidemia Daughter   . Obesity Son   . Hypertension Son   . Obesity Maternal Grandmother   . Heart attack Maternal Grandmother   . Stroke Paternal Grandfather     Prior to Admission medications   Medication Sig Start Date End Date Taking? Authorizing Provider  amLODipine (NORVASC) 2.5 MG tablet Take 2.5 mg by mouth daily. 12/30/18  Yes [provider]  donepezil (ARICEPT) 5 MG tablet Take 5 mg by mouth at bedtime. 12/30/18  Yes [provider]  Fish Oil-Coenzyme Q10 (FISH OIL PLUS CO Q-10) 1000-30 MG CAPS Take 1 tablet by mouth daily.   Yes [provider]  Multiple Vitamins-Minerals (CENTRUM SILVER ADULT 50+ PO) Take 1 tablet by mouth daily.   Yes [provider]    Physical Exam: Constitutional: Moderately built and nourished. Vitals:   02/01/19 0047 02/01/19 0048 02/01/19 0049 02/01/19 0121  BP:    (!) 159/66  Pulse: 74 70 69 65  Resp: 18 (!) 22 16 (!) 25  Temp:    99.4 F (37.4 C)  TempSrc:    Oral  SpO2: 96% 96% 94% 93%  Weight:      Height:       Eyes: Anicteric no pallor. ENMT: No discharge from the ears eyes nose or mouth. Neck: No mass felt.  No neck rigidity. Respiratory: No rhonchi or crepitations. Cardiovascular: S1-S2 heard. Abdomen: Soft nontender bowel sound present. Musculoskeletal: No edema. Skin: No rash. Neurologic: Alert awake oriented to person.  Moves all extremities. Psychiatric: Has memory issues.   Labs on Admission: I have personally reviewed following labs and imaging studies  CBC: Recent Labs  Lab 01/31/19 2056  WBC 6.3  NEUTROABS 5.0  HGB 15.7*  HCT 47.9*  MCV 95.8  PLT 99991111   Basic Metabolic Panel: Recent Labs  Lab 01/31/19 2056  NA 139  K 3.6  CL 106   CO2 24  GLUCOSE 131*  BUN 19  CREATININE 0.72  CALCIUM 9.5   GFR: Estimated Creatinine Clearance: 53.3 mL/min (by C-G formula based on SCr of 0.72 mg/dL). Liver Function Tests: Recent Labs  Lab 01/31/19 2056  AST 29  ALT 34  ALKPHOS 59  BILITOT 0.8  PROT 6.9  ALBUMIN 3.7   No results for input(s): LIPASE, AMYLASE in the last 168 hours. No results for input(s): AMMONIA in the last 168 hours. Coagulation Profile: No results for input(s): INR, PROTIME in the last 168 hours.  Cardiac Enzymes: No results for input(s): CKTOTAL, CKMB, CKMBINDEX, TROPONINI in the last 168 hours. BNP (last 3 results) No results for input(s): PROBNP in the last 8760 hours. HbA1C: No results for input(s): HGBA1C in the last 72 hours. CBG: No results for input(s): GLUCAP in the last 168 hours. Lipid Profile: No results for input(s): CHOL, HDL, LDLCALC, TRIG, CHOLHDL, LDLDIRECT in the last 72 hours. Thyroid Function Tests: No results for input(s): TSH, T4TOTAL, FREET4, T3FREE, THYROIDAB in the last 72 hours. Anemia Panel: No results for input(s): VITAMINB12, FOLATE, FERRITIN, TIBC, IRON, RETICCTPCT in the last 72 hours. Urine analysis:    Component Value Date/Time   COLORURINE YELLOW 11/15/2013 1106   APPEARANCEUR CLEAR 11/15/2013 1106   LABSPEC 1.020 11/15/2013 1106   PHURINE 5.5 11/15/2013 1106   GLUCOSEU NEGATIVE 11/15/2013 1106   HGBUR NEGATIVE 11/15/2013 1106   BILIRUBINUR NEGATIVE 11/15/2013 1106   BILIRUBINUR neg 12/31/2011 1355   KETONESUR NEGATIVE 11/15/2013 1106   PROTEINUR neg 12/31/2011 1355   UROBILINOGEN 0.2 11/15/2013 1106   NITRITE NEGATIVE 11/15/2013 1106   LEUKOCYTESUR NEGATIVE 11/15/2013 1106   Sepsis Labs: @LABRCNTIP (procalcitonin:4,lacticidven:4) )No results found for this or any previous visit (from the past 240 hour(s)).   Radiological Exams on Admission: Dg Chest Portable 1 View  Result Date: 01/31/2019 CLINICAL DATA:  Cough, fever EXAM: PORTABLE CHEST 1 VIEW  COMPARISON:  03/14/2015 FINDINGS: Heart and mediastinal contours are within normal limits. No focal opacities or effusions. No acute bony abnormality. IMPRESSION: No active cardiopulmonary disease. Electronically Signed   By: Rolm Baptise M.D.   On: 01/31/2019 21:45    EKG: Independently reviewed.  Normal sinus rhythm.  Assessment/Plan Principal Problem:   COVID-19 virus infection Active Problems:   Hypertension   SIRS (systemic inflammatory response syndrome) ([place])    1. COVID-19 infection with progressive weakness -we will keep patient on remdesivir and Decadron for now and closely monitor inflammatory markers.  Get physical therapy consult to see if patient will need higher level of care. 2. Hypertension on amlodipine. 3. History of memory issues on Aricept. 4. Progressive decline in function will check TSH CK levels.  Given the COVID-19 infection and progressive weakness patient will need more than 2 midnight stay and inpatient status.   DVT prophylaxis: Lovenox. Code Status: DNR confirmed with patient's daughter. Family Communication: Patient's daughter. Disposition Plan: To be determined. Consults called: Physical therapy. Admission status: Inpatient.   Rise Patience MD [facility] Pager [phone].  If 7PM-7AM, please contact night-coverage [email or web address] Password [email or web address]  02/01/2019, 1:43 AM

## 2019-02-01 NOTE — Progress Notes (Signed)
Per HPI: Taylor Vaughn is a 85 y.o. female with history of hypertension and memory issues on Aricept and amlodipine over the last 2 to 3 days has been feeling more weak than usual.  As per the patient's daughter patient has been gradually decreasing activity and become more bedbound.  Over the last 2 days patient has benign nausea vomiting and diarrhea and feeling very weak to the point that she is no edema able to get onto the commode and get out.  Patient lives in independent living facility.  Patient was brought to the ER for further management and constellation of symptoms were concerning for COVID-19.  11/30: Patient is somnolent and appears mostly asymptomatic. PT evaluation pending along with other labs. Recheck in am and dispo per PT evaluation. Will start Lovenox today. Continue decadron and remdesivir.  Total care time: 25 minutes.

## 2019-02-01 NOTE — Progress Notes (Signed)
PT Cancellation Note  Patient Details Name: Taylor Vaughn MRN: [id number] DOB: 1929-09-27   Cancelled Treatment:    Reason Eval/Treat Not Completed: Patient declined, no reason specified.  Pt sleeping on arrival (11:50 am) and does not recall where she was admitted from.  Pt adamantly declined any activity (sitting EOB, washing face, bathroom needs).  Pt states, "You seem like a nice person, but just leave me alone."   Tyde Lamison,KATHrine E 02/01/2019, 1:42 PM Carmelia Bake, PT, DPT Acute Rehabilitation Services Office: [phone] Pager: [phone]

## 2019-02-01 NOTE — Progress Notes (Signed)
Patient extremely agitated with nurse while nurse is trying to complete an assessment. She voiced that she did not want any part in anything that I had to do. I asked if I could change the battery in her telemetry monitor which she allowed but voiced that I should be very sorry for even bothering her. Offered water, denied need. Offered to check her for incontinence, denied need. Denied pain and/or shortness of breath. Voiced for nurse to get out of her room and not to bother her that she is trying to rest. Bed alarm on.

## 2019-02-01 NOTE — ED Notes (Signed)
Unable to obtain blood work Pt had pull out one IV and stated she did not know what happened. I tried to start another IV . Pt has one IV in left had that will not draw back blood. Pt stated " you are not getting any more blood from me. Pt proceeded to yell and stated "all you want to do is hurt me you stupid bastard" Pt was placed on 2L due to o2 being 88% on RA. Pt took off the oxyen and stated she does not need it. Reapplied o2 3 times. Pt had oxygen on when I left the room stating 95%

## 2019-02-02 LAB — URINALYSIS, ROUTINE W REFLEX MICROSCOPIC
Bilirubin Urine: NEGATIVE
Glucose, UA: NEGATIVE mg/dL
Hgb urine dipstick: NEGATIVE
Ketones, ur: NEGATIVE mg/dL
Leukocytes,Ua: NEGATIVE
Nitrite: NEGATIVE
Protein, ur: NEGATIVE mg/dL
Specific Gravity, Urine: 1.012 (ref 1.005–1.030)
pH: 6 (ref 5.0–8.0)

## 2019-02-02 LAB — TSH: TSH: 3.246 u[IU]/mL (ref 0.350–4.500)

## 2019-02-02 MED ORDER — DEXAMETHASONE 6 MG PO TABS: 6.0000 mg | ORAL_TABLET | Freq: Every day | ORAL | 0 refills | Status: DC

## 2019-02-02 NOTE — NC FL2 (Signed)
[place] LEVEL OF CARE SCREENING TOOL     IDENTIFICATION  Patient Name: Taylor Vaughn Birthdate: 1929-07-22 Sex: female Admission Date (Current Location): 01/31/2019  [facility] and [place] Number:  Herbalist and Address:         Provider Number: [id number]  Attending Physician Name and Address:  Rodena Goldmann, DO  Relative Name and Phone Number:  Daryel November [phone]    Current Level of Care: Hospital Recommended Level of Care: [place] Prior Approval Number:    Date Approved/Denied:   PASRR Number:    Discharge Plan: SNF    Current Diagnoses: Patient Active Problem List   Diagnosis Date Noted  . SIRS (systemic inflammatory response syndrome) ([place]) 02/01/2019  . COVID-19 virus infection 01/31/2019  . Vertigo 11/06/2016  . Gait abnormality 11/06/2016  . Sciatica associated with disorder of lumbar spine 05/29/2015  . Right leg pain 05/02/2015  . Right knee pain 05/02/2015  . Lesion of skin of face 05/02/2015  . Maxillary sinusitis, acute 03/14/2015  . Cough 03/14/2015  . Left knee injury 10/21/2014  . Cataract 01/11/2013  . Tinea corporis 11/15/2012  . Cervical cancer screening 03/19/2012  . Renal artery stenosis ([place]) 11/24/2011  . Allergic rhinitis 11/22/2011  . Right shoulder strain 10/03/2011  . MRSA (methicillin resistant staph aureus) culture positive 10/03/2011  . Grief reaction 10/03/2011  . Candidal skin infection 10/03/2011  . Urinary frequency 07/22/2011  . Acute chest wall pain 07/22/2011  . Medicare annual wellness visit, subsequent 07/22/2011  . Hypertension   . Hyperlipidemia   . Insomnia   . Colon polyps     Orientation RESPIRATION BLADDER Height & Weight     Self, Time, Situation  Normal Continent Weight: 83.7 kg Height:  5\' 6"  (167.6 cm)  BEHAVIORAL SYMPTOMS/MOOD NEUROLOGICAL BOWEL NUTRITION STATUS      Continent Diet(Heart Healthy)  AMBULATORY STATUS COMMUNICATION OF NEEDS Skin    Limited Assist Verbally Normal                       Personal Care Assistance Level of Assistance  Bathing, Feeding, Dressing Bathing Assistance: Independent Feeding assistance: Limited assistance Dressing Assistance: Limited assistance     Functional Limitations Info  Sight, Hearing Sight Info: Adequate Hearing Info: Adequate      SPECIAL CARE FACTORS FREQUENCY                       Contractures Contractures Info: Present    Additional Factors Info  Code Status, Allergies Code Status Info: DNR Allergies Info: Lisinopril, Latex, Statins, Keflex Cephalexin, Losartan           Current Medications (02/02/2019):  This is the current hospital active medication list Current Facility-Administered Medications  Medication Dose Route Frequency Provider Last Rate Last Dose  . acetaminophen (TYLENOL) tablet 650 mg  650 mg Oral Q6H PRN Rise Patience, MD       Or  . acetaminophen (TYLENOL) suppository 650 mg  650 mg Rectal Q6H PRN Rise Patience, MD      . amLODipine (NORVASC) tablet 2.5 mg  2.5 mg Oral Daily Rise Patience, MD   2.5 mg at 02/02/19 1415  . dexamethasone (DECADRON) tablet 6 mg  6 mg Oral Daily Rise Patience, MD   6 mg at 02/02/19 1018  . donepezil (ARICEPT) tablet 5 mg  5 mg Oral QHS Rise Patience, MD   5 mg at  02/01/19 0223  . enoxaparin (LOVENOX) injection 40 mg  40 mg Subcutaneous Daily Lenis Noon, RPH   40 mg at 02/01/19 1728  . ondansetron (ZOFRAN) tablet 4 mg  4 mg Oral Q6H PRN Rise Patience, MD       Or  . ondansetron [facility]) injection 4 mg  4 mg Intravenous Q6H PRN Rise Patience, MD      . remdesivir 100 mg in sodium chloride 0.9 % 250 mL IVPB  100 mg Intravenous Q24H Dorrene German, [facility]         Discharge Medications: Please see discharge summary for a list of discharge medications.  Relevant Imaging Results:  Relevant Lab Results:   Additional  Information [id number]  Purcell Mouton, RN

## 2019-02-02 NOTE — Progress Notes (Addendum)
NT Mae informed this nurse that pt had removed peripheral IV. Upon entry into room with patient medications and supplies to attempt a new IV site, patient very agitated and upset, stating "I just want to sleep! Get out!" This nurse attempted to educate patient on importance of taking prescribed medications, as well as assess patient. Patient voiced same response, "Get out! Don't come in my room while I'm sleeping! If you don't leave now I'm going to get nasty!" Pt did allow NT to bathe her earlier in the morning. MD Manuella Ghazi made aware of above. Will attempt remainder of assessment, medication administration, and to obtain new IV site at a later time. Will continue to monitor patient.

## 2019-02-02 NOTE — Discharge Summary (Addendum)
Physician Discharge Summary  Taylor Vaughn [id number] DOB: 07-28-1929 DOA: 01/31/2019  PCP: Taylor Amel, MD  Admit date: 01/31/2019  Discharge date: 02/02/2019  Admitted From:ILF  Disposition:  SNF  Recommendations for Outpatient Follow-up:  1. Follow up with PCP in 1-2 weeks 2. Continue prior home medications 3. Continue on decadron as prescribed for 5 more days  Home Health:None  Equipment/Devices:None  Discharge Condition:Stable  CODE STATUS: DNR  Diet recommendation: Heart Healthy  Brief/Interim Summary: Per HPI: Taylor Liebowitzis a 85 y.o.femalewithhistory of hypertension and memory issues on Aricept and amlodipine over the last 2 to 3 days has been feeling more weak than usual. As per the patient's daughter patient has been gradually decreasing activity and become more bedbound. Over the last 2 days patient has benign nausea vomiting and diarrhea and feeling very weak to the point that she is no edema able to get onto the commode and get out. Patient lives in independent living facility. Patient was brought to the ER for further management and constellation of symptoms were concerning for COVID-19.  11/30: Patient is somnolent and appears mostly asymptomatic. PT evaluation pending along with other labs. Recheck in am and dispo per PT evaluation. Will start Lovenox today. Continue decadron and remdesivir.  12/1: Patient has participated with PT today and recommendation is for SNF on discharge. No other acute events noted during this stay.  Discharge Diagnoses:  Principal Problem:   COVID-19 virus infection Active Problems:   Hypertension   SIRS (systemic inflammatory response syndrome) (HCC)  Principle Discharge Diagnosis: COVID-19 infection with progressive weakness.  Discharge Instructions  Discharge Instructions    Diet - low sodium heart healthy   Complete by: As directed    Increase activity slowly   Complete by: As directed       Allergies as of 02/02/2019      Reactions   Lisinopril Cough   Latex    She does not like the feel of Latex (or nail polish, make-up or perfume).   Statins    Muscle cramps, rashes, loss of hair   Keflex [cephalexin] Rash   Losartan    phlegm      Medication List    TAKE these medications   amLODipine 2.5 MG tablet Commonly known as: NORVASC Take 2.5 mg by mouth daily.   CENTRUM SILVER ADULT 50+ PO Take 1 tablet by mouth daily.   dexamethasone 6 MG tablet Commonly known as: DECADRON Take 1 tablet (6 mg total) by mouth daily for 5 days. Start taking on: February 03, 2019   donepezil 5 MG tablet Commonly known as: ARICEPT Take 5 mg by mouth at bedtime.   Fish Oil Plus Co Q-10 1000-30 MG Caps Take 1 tablet by mouth daily.      Follow-up Information    Vaughn, Dibas, MD Follow up in 1 week(s).   Specialty: Family Medicine Contact information: [address] [place] [address] [phone]          Allergies  Allergen Reactions  . Lisinopril Cough  . Latex     She does not like the feel of Latex (or nail polish, make-up or perfume).  . Statins     Muscle cramps, rashes, loss of hair  . Keflex [Cephalexin] Rash  . Losartan     phlegm    Consultations:  None   Procedures/Studies: Ct Head Wo Contrast  Result Date: 02/01/2019 CLINICAL DATA:  COVID positive.  Altered mental status. EXAM: CT HEAD WITHOUT CONTRAST TECHNIQUE:  Contiguous axial images were obtained from the base of the skull through the vertex without intravenous contrast. COMPARISON:  None. FINDINGS: Brain: There is atrophy and chronic small vessel disease changes. No acute intracranial abnormality. Specifically, no hemorrhage, hydrocephalus, mass lesion, acute infarction, or significant intracranial injury. Vascular: No hyperdense vessel or unexpected calcification. Skull: No acute calvarial abnormality. Sinuses/Orbits: No acute findings Other: None IMPRESSION: Atrophy,  chronic microvascular disease. No acute intracranial abnormality. Electronically Signed   By: Rolm Baptise M.D.   On: 02/01/2019 02:55   Dg Chest Portable 1 View  Result Date: 01/31/2019 CLINICAL DATA:  Cough, fever EXAM: PORTABLE CHEST 1 VIEW COMPARISON:  03/14/2015 FINDINGS: Heart and mediastinal contours are within normal limits. No focal opacities or effusions. No acute bony abnormality. IMPRESSION: No active cardiopulmonary disease. Electronically Signed   By: Rolm Baptise M.D.   On: 01/31/2019 21:45     Discharge Exam: Vitals:   02/02/19 0600 02/02/19 1316  BP: 133/77 (!) 145/61  Pulse: 71 61  Resp: 18 17  Temp: 98.9 F (37.2 C) 98.6 F (37 C)  SpO2: 92% 95%   Vitals:   02/01/19 0529 02/01/19 1709 02/02/19 0600 02/02/19 1316  BP: (!) 134/94 136/88 133/77 (!) 145/61  Pulse: 62 63 71 61  Resp: 20 20 18 17   Temp: 99.3 F (37.4 C) 98.9 F (37.2 C) 98.9 F (37.2 C) 98.6 F (37 C)  TempSrc: Oral  Oral   SpO2: 94% 93% 92% 95%  Weight:      Height:        General: Pt is alert, awake, not in acute distress Cardiovascular: RRR, S1/S2 +, no rubs, no gallops Respiratory: CTA bilaterally, no wheezing, no rhonchi, on RA Abdominal: Soft, NT, ND, bowel sounds + Extremities: no edema, no cyanosis    The results of significant diagnostics from this hospitalization (including imaging, microbiology, ancillary and laboratory) are listed below for reference.     Microbiology: Recent Results (from the past 240 hour(s))  Blood culture (routine x 2)     Status: None (Preliminary result)   Collection Time: 01/31/19  8:56 PM   Specimen: BLOOD  Result Value Ref Range Status   Specimen Description   Final    BLOOD RIGHT ANTECUBITAL Performed at [facility], [place] [address], [place], [place] [address]    Special Requests   Final    BOTTLES DRAWN AEROBIC AND ANAEROBIC Blood Culture adequate volume Performed at [place] [address], [place],  [place] [address]    Culture   Final    NO GROWTH 1 DAY Performed at [facility], [place] [address], [place], [place] [address]    Report Status PENDING  Incomplete  Blood culture (routine x 2)     Status: None (Preliminary result)   Collection Time: 01/31/19  8:57 PM   Specimen: BLOOD  Result Value Ref Range Status   Specimen Description   Final    BLOOD LEFT ASSIST CONTROL Performed at [facility] [address] [place], [place] [address]    Special Requests   Final    BOTTLES DRAWN AEROBIC AND ANAEROBIC Blood Culture adequate volume Performed at [place] [address] [place], [place] [address]    Culture   Final    NO GROWTH 1 DAY Performed at [facility], [place] [address], [place], [place] [address]    Report Status PENDING  Incomplete  MRSA PCR Screening  Status: None   Collection Time: 02/01/19  4:21 AM   Specimen: Nasopharyngeal  Result Value Ref Range Status   MRSA by PCR NEGATIVE NEGATIVE Final    Comment:        The GeneXpert MRSA Assay (FDA approved for NASAL specimens only), is one component of a comprehensive MRSA colonization surveillance program. It is not intended to diagnose MRSA infection nor to guide or monitor treatment for MRSA infections. Performed at [facility], [place] [address], [place], [place] [address]      Labs: BNP (last 3 results) No results for input(s): BNP in the last 8760 hours. Basic Metabolic Panel: Recent Labs  Lab 01/31/19 2056 02/01/19 0324  NA 139 139  K 3.6 3.6  CL 106 104  CO2 24 26  GLUCOSE 131* 128*  BUN 19 20  CREATININE 0.72 0.78  CALCIUM 9.5 9.2   Liver Function Tests: Recent Labs  Lab 01/31/19 2056 02/01/19 0324  AST 29 24  ALT 34 30  ALKPHOS 59 52  BILITOT 0.8 0.4  PROT 6.9 6.0*  ALBUMIN 3.7 3.1*   No results for input(s): LIPASE, AMYLASE in the last 168 hours. No results for input(s): AMMONIA in the last 168  hours. CBC: Recent Labs  Lab 01/31/19 2056 02/01/19 0324  WBC 6.3 6.1  NEUTROABS 5.0 4.4  HGB 15.7* 14.7  HCT 47.9* 45.3  MCV 95.8 98.3  PLT 263 245   Cardiac Enzymes: No results for input(s): CKTOTAL, CKMB, CKMBINDEX, TROPONINI in the last 168 hours. BNP: Invalid input(s): POCBNP CBG: No results for input(s): GLUCAP in the last 168 hours. D-Dimer Recent Labs    02/01/19 1426  DDIMER 0.55*   Hgb A1c No results for input(s): HGBA1C in the last 72 hours. Lipid Profile No results for input(s): CHOL, HDL, LDLCALC, TRIG, CHOLHDL, LDLDIRECT in the last 72 hours. Thyroid function studies Recent Labs    01/31/19 2056  TSH 3.246   Anemia work up Recent Labs    02/01/19 0324  FERRITIN 367*   Urinalysis    Component Value Date/Time   COLORURINE YELLOW 11/15/2013 1106   APPEARANCEUR CLEAR 11/15/2013 1106   LABSPEC 1.020 11/15/2013 1106   PHURINE 5.5 11/15/2013 1106   GLUCOSEU NEGATIVE 11/15/2013 1106   [place] 11/15/2013 1106   [place] 11/15/2013 1106   BILIRUBINUR neg 12/31/2011 1355   KETONESUR NEGATIVE 11/15/2013 1106   PROTEINUR neg 12/31/2011 1355   UROBILINOGEN 0.2 11/15/2013 1106   NITRITE NEGATIVE 11/15/2013 1106   LEUKOCYTESUR NEGATIVE 11/15/2013 1106   Sepsis Labs Invalid input(s): PROCALCITONIN,  WBC,  LACTICIDVEN Microbiology Recent Results (from the past 240 hour(s))  Blood culture (routine x 2)     Status: None (Preliminary result)   Collection Time: 01/31/19  8:56 PM   Specimen: BLOOD  Result Value Ref Range Status   Specimen Description   Final    BLOOD RIGHT ANTECUBITAL Performed at [facility], [place] [address], [place], [place] [address]    Special Requests   Final    BOTTLES DRAWN AEROBIC AND ANAEROBIC Blood Culture adequate volume Performed at [place] [address] [place], [place] [address]    Culture   Final    NO GROWTH 1 DAY Performed at [facility], [place] [address], [place], [place] [address]    Report Status PENDING  Incomplete  Blood culture (routine x 2)     Status: None (Preliminary result)   Collection Time: 01/31/19  8:57 PM   Specimen: BLOOD  Result Value Ref Range Status   Specimen Description   Final    BLOOD LEFT ASSIST CONTROL Performed at [place] [address] [place], [place] [address]    Special Requests   Final    BOTTLES DRAWN AEROBIC AND ANAEROBIC Blood Culture adequate volume Performed at [facility] [address], [place], [place] [address]    Culture   Final    NO GROWTH 1 DAY Performed at [facility], [place] [address] [place], [place] [address]    Report Status PENDING  Incomplete  MRSA PCR Screening     Status: None   Collection Time: 02/01/19  4:21 AM   Specimen: Nasopharyngeal  Result Value Ref Range Status   MRSA by PCR NEGATIVE NEGATIVE Final    Comment:        The GeneXpert MRSA Assay (FDA approved for NASAL specimens only), is one component of a comprehensive MRSA colonization surveillance program. It is not intended to diagnose MRSA infection nor to guide or monitor treatment for MRSA infections. Performed at [facility], [place] [address] [place], [place] [address]      Time coordinating discharge: 35 minutes  SIGNED:   Rodena Goldmann, DO [facility] 02/02/2019, 2:30 PM  If 7PM-7AM, please contact night-coverage [email or web address]

## 2019-02-02 NOTE — Progress Notes (Signed)
Patient's daughter called to check on patient. She was informed of patients uncooperative and agitated behavior earlier this morning, refusing care. Daughter stated to call her from patient room to let her speak with patient, if patient refused care or medication. This nurse did the above, and patient was pleasant and agreeable to medication administration and performing an assessment. Patient was also agreeable to PT working with pt.

## 2019-02-02 NOTE — Evaluation (Signed)
Physical Therapy Evaluation Patient Details Name: Taylor Vaughn MRN: [id number] DOB: [birth year] Today's Date: 02/02/2019   History of Present Illness  Pt is 85 y.o. female with history of hypertension and memory issues on Aricept and amlodipine over the last 2 to 3 days has been feeling more weak than usual.  Pt found to be + for COVID 19 and admitted to hospital.  Clinical Impression  Pt admitted with above diagnosis. Pt has been refusing therapy and medical care earlier, but at this time was pleasant and agreeable.  She required min A for transfers and gait, ambulated 15' with RW with min A, and fatigued easily.  Pt is fall risk and would have difficulty managing at independent living at this time from PT perspective.  Her vitals were stable with activity.  Pt currently with functional limitations due to the deficits listed below (see PT Problem List). Pt will benefit from skilled PT to increase their independence and safety with mobility to allow discharge to the venue listed below.       Follow Up Recommendations SNF    Equipment Recommendations  None recommended by PT    Recommendations for Other Services OT consult     Precautions / Restrictions Precautions Precautions: Fall Restrictions Weight Bearing Restrictions: No      Mobility  Bed Mobility Overal bed mobility: Needs Assistance Bed Mobility: Supine to Sit;Sit to Supine     Supine to sit: Min assist;HOB elevated Sit to supine: Min assist;HOB elevated   General bed mobility comments: increased time and use of bed rails;  assist to scoot forward and to get feet back into bed  Pt with some c/o dizziness/"room spinning" when returned to supine (did not note nystagmus)  Transfers Overall transfer level: Needs assistance Equipment used: Rolling walker (2 wheeled) Transfers: Sit to/from Stand Sit to Stand: Min assist         General transfer comment: cues for safe hand  placement  Ambulation/Gait Ambulation/Gait assistance: Min assist Gait Distance (Feet): 18 Feet Assistive device: Rolling walker (2 wheeled) Gait Pattern/deviations: Decreased stride length;Shuffle     General Gait Details: assist with RW and for balance, as fatigued pt sped up to get back to bed and with decreased balance/safety; reports fatigued  Stairs            Wheelchair Mobility    Modified Rankin (Stroke Patients Only)       Balance Overall balance assessment: Needs assistance Sitting-balance support: Bilateral upper extremity supported;Feet supported Sitting balance-Leahy Scale: Poor Sitting balance - Comments: could maintain balance but needed UE support and fatigued easily   Standing balance support: Bilateral upper extremity supported;During functional activity Standing balance-Leahy Scale: Poor                               Pertinent Vitals/Pain Pain Assessment: No/denies pain    Home Living Family/patient expects to be discharged to:: Other (Comment)(senior independent living facility)                      Prior Function Level of Independence: Independent with assistive device(s)         Comments: Pt reports she was independent with ADLs and basic IADLs.   States she could walk at grocery store and prepare simple meals.  Pt is somewhat questionable historian due to hx of memory issues.  Reports she uses a RW>     Hand Dominance  Extremity/Trunk Assessment   Upper Extremity Assessment Upper Extremity Assessment: RUE deficits/detail;LUE deficits/detail RUE Deficits / Details: ROM WFL; MMT 4-/5 LUE Deficits / Details: ROM WFL; MMT 4-/5    Lower Extremity Assessment Lower Extremity Assessment: RLE deficits/detail;LLE deficits/detail RLE Deficits / Details: ROM WFL; MMT 4-/5 LLE Deficits / Details: ROM WFL; MMT 4-/5    Cervical / Trunk Assessment Cervical / Trunk Assessment: Kyphotic  Communication    Communication: HOH  Cognition Arousal/Alertness: Awake/alert Behavior During Therapy: WFL for tasks assessed/performed Overall Cognitive Status: No family/caregiver present to determine baseline cognitive functioning                                 General Comments: Pt able to answer 75% of questions, but at time inconsistent, requiring increased time, and providing partial answers.  She had c/o generalized weakness.      General Comments General comments (skin integrity, edema, etc.): On RA with O2 sats 95% in bed and 96-97% standing/walking    Exercises     Assessment/Plan    PT Assessment Patient needs continued PT services  PT Problem List Decreased strength;Decreased mobility;Decreased safety awareness;Decreased activity tolerance;Decreased cognition;Decreased balance;Decreased knowledge of use of DME       PT Treatment Interventions DME instruction;Therapeutic exercise;Gait training;Balance training;Neuromuscular re-education;Functional mobility training;Therapeutic activities;Patient/family education    PT Goals (Current goals can be found in the Care Plan section)  Acute Rehab PT Goals Patient Stated Goal: not stated; RN spoke with family who was concerned about pt returning to independent living PT Goal Formulation: Patient unable to participate in goal setting Time For Goal Achievement: 02/16/19 Potential to Achieve Goals: Good    Frequency Min 2X/week   Barriers to discharge Decreased caregiver support      Co-evaluation               AM-PAC PT "6 Clicks" Mobility  Outcome Measure Help needed turning from your back to your side while in a flat bed without using bedrails?: A Little Help needed moving from lying on your back to sitting on the side of a flat bed without using bedrails?: A Little Help needed moving to and from a bed to a chair (including a wheelchair)?: A Little Help needed standing up from a chair using your arms (e.g.,  wheelchair or bedside chair)?: A Little Help needed to walk in hospital room?: A Little Help needed climbing 3-5 steps with a railing? : A Lot 6 Click Score: 17    End of Session Equipment Utilized During Treatment: Gait belt Activity Tolerance: Patient limited by fatigue Patient left: in bed;with family/visitor present;with bed alarm set Nurse Communication: Mobility status(RN present) PT Visit Diagnosis: Unsteadiness on feet (R26.81);Other abnormalities of gait and mobility (R26.89);Muscle weakness (generalized) (M62.81)    Time: [id number] PT Time Calculation (min) (ACUTE ONLY): 24 min   Charges:   PT Evaluation $PT Eval Moderate Complexity: 1 Mod          Maggie Font, PT Acute Rehab Services Pager [phone] [facility] [phone] [facility] [phone]   Karlton Lemon 02/02/2019, 3:46 PM
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
## 2019-02-03 ENCOUNTER — Inpatient Hospital Stay (HOSPITAL_COMMUNITY): Payer: Medicare Other

## 2019-02-03 LAB — COMPREHENSIVE METABOLIC PANEL
ALT: 26 U/L (ref 0–44)
AST: 19 U/L (ref 15–41)
Albumin: 2.8 g/dL — ABNORMAL LOW (ref 3.5–5.0)
Alkaline Phosphatase: 47 U/L (ref 38–126)
Anion gap: 8 (ref 5–15)
BUN: 21 mg/dL (ref 8–23)
CO2: 24 mmol/L (ref 22–32)
Calcium: 9.2 mg/dL (ref 8.9–10.3)
Chloride: 106 mmol/L (ref 98–111)
Creatinine, Ser: 0.71 mg/dL (ref 0.44–1.00)
GFR calc Af Amer: >60 mL/min (ref >60)
GFR calc non Af Amer: >60 mL/min (ref >60)
Glucose, Bld: 157 mg/dL — ABNORMAL HIGH (ref 70–99)
Potassium: 3.7 mmol/L (ref 3.5–5.1)
Sodium: 138 mmol/L (ref 135–145)
Total Bilirubin: 0.4 mg/dL (ref 0.3–1.2)
Total Protein: 5.6 g/dL — ABNORMAL LOW (ref 6.5–8.1)

## 2019-02-03 LAB — FERRITIN: Ferritin: 548 ng/mL — ABNORMAL HIGH (ref 11–307)

## 2019-02-03 LAB — C-REACTIVE PROTEIN: CRP: 0.7 mg/dL (ref <1.0)

## 2019-02-03 MED ORDER — POTASSIUM CHLORIDE CRYS ER 20 MEQ PO TBCR
20.0000 meq | EXTENDED_RELEASE_TABLET | Freq: Once | ORAL | Status: AC
  Administered 2019-02-03: 20 meq via ORAL
  Filled 2019-02-03: qty 1

## 2019-02-03 NOTE — Progress Notes (Signed)
PROGRESS NOTE    Taylor Vaughn  [id number] DOB: [birth year] DOA: 01/31/2019 PCP: Lujean Amel, MD   Brief Narrative: 85 year old female with hypertension, memory issues on Aricept, amlodipine feeling weak for last 2 to 3 days with decreasing activity and becoming more bedbound.  Over the last 2 days PTA having nausea vomiting and diarrhea.  Patient was brought from independent living facility to ER with constellation of symptoms, in the ER work-up showed COVID-19 was positive level creatinine 0.7 LFTs and hemoglobin stable chest x-ray was unremarkable CT head unremarkable patient was admitted for COVID-19 infection with progressive weakness.  Subjective:  Resting on RA, refusing care per RN but was calm for me, told mer her name and that she is at [place].Doesn't know why she is here. Took all her meds this am, no iv access as she refused and not getting any iv meds. She received remdesivir last on 11/30, taking po decadron. Patient reports pat does not want to eat, wants to sleep, no nausea or vomitting. Overnight noted 2.0 seconds pause in tele. 2 BM yesterday medium and small, liquid,no Bm since then.   Assessment & Plan:   COVID-19 virus infection with nausea vomiting diarrhea and generalized weakness: No pulmonary symptoms.On admission troponin is stable 12-13,ferritin was up at 367 (Nl <307), CRP less than <0.8, D-dimer 0.55 (nl 0.50),currently no leukocytosis,afebrile. I repeated CXR 12/2-no infiltrates and pateint is saturating well at 94% on RA.Patient has been eating well today and feeling hungry no nausea or vomiting. Received remdesivir in ER. No iv access and Dr Manuella Ghazi felt no need to continue remdesivir- spoke to him today, and since her x-ray is clear/not hypoxic- will hold off on further remdesivir and also patient has been refusing IV line/removing it. Discussed the plan of care regarding her therapy with patient's son and daughter.  No iv access: Patient is  refusing IV access since 12/1, tried multiple times. Will see if pt agrees- asked RN to call daughter while she is in room for iv access, per daughter request.   Memory issues/dementia alert awake follows some commands oriented to place but not to date.Patient not been very compliant with the treatment, not eating refusing medication , at times agitated per nursing staff.  Hypertension:BP is controlled.  On amlodipine.  SIRS ? On admission:Afebrile no leukocytosis, blood cx neg, do not suspect infection.  Mild Hypokalemia will replete with potassium chloride  Debility/physical deconditioning:continue PT OT will need a skilled nursing facility placement.  Body mass index is 29.78 kg/m.   DVT prophylaxis: lovenox-refusing. Code Status:  DNR Family Communication: plan of care discussed with patient.  I called patient's son and updated regarding plan of care and in agreement.  I also spoke with patient's son in [place] [phone]  Disposition Plan: Remains inpatient pending skilled nursing facility.  Clinical improvement.  PT./OT has recommended skilled nursing facility and CM working on it.   Consultants:  none  Procedures: none  Microbiology: blood cx neg so far, covid 19 ag rapid +  Antimicrobials: Anti-infectives (From admission, onward)   Start     Dose/Rate Route Frequency Ordered Stop   02/02/19 1000  remdesivir 100 mg in sodium chloride 0.9 % 250 mL IVPB     100 mg 500 mL/hr over 30 Minutes Intravenous Every 24 hours 02/01/19 0258 02/06/19 0959   02/01/19 0230  remdesivir 200 mg in sodium chloride 0.9 % 250 mL IVPB     200 mg 500 mL/hr over 30  Minutes Intravenous Once 02/01/19 0151 02/01/19 0935       Objective: Vitals:   02/02/19 1316 02/02/19 2019 02/03/19 0030 02/03/19 0638  BP: (!) 145/61 126/60 134/70 (!) 154/82  Pulse: 61 (!) 55 (!) 55 (!) 59  Resp: 17 18  20   Temp: 98.6 F (37 C) 98.7 F (37.1 C)  97.7 F (36.5 C)  TempSrc:  Oral  Oral   SpO2: 95% 93% 96% 93%  Weight:      Height:        Intake/Output Summary (Last 24 hours) at 02/03/2019 1017 Last data filed at 02/03/2019 0600 Gross per 24 hour  Intake 20 ml  Output 150 ml  Net -130 ml   Filed Weights   01/31/19 2051 02/01/19 0300  Weight: 81.6 kg 83.7 kg   Weight change:   Body mass index is 29.78 kg/m.  Intake/Output from previous day: 12/01 0701 - 12/02 0700 In: 260 [P.O.:260] Out: 250 [Urine:250] Intake/Output this shift: No intake/output data recorded.  Examination:  General exam: AAO to self, place not to date, obese, on RA, NAD, Weak appearing. HEENT:Oral mucosa moist, Ear/Nose WNL grossly, dentition normal. Respiratory system: B/L CLEAR ,no use of accessory muscle Cardiovascular system: S1 & S2 +, No JVD,. Gastrointestinal system: Abdomen soft, NT,ND, BS+ Nervous System:Alert, awake, oriented x2, moving extremities and grossly nonfocal Extremities: No edema, distal peripheral pulses palpable.  Skin: No rashes,no icterus. MSK: Normal muscle bulk,tone, power  Medications:  Scheduled Meds: . amLODipine  2.5 mg Oral Daily  . dexamethasone  6 mg Oral Daily  . donepezil  5 mg Oral QHS  . enoxaparin (LOVENOX) injection  40 mg Subcutaneous Daily   Continuous Infusions: . remdesivir 100 mg in NS 250 mL      Data Reviewed: I have personally reviewed following labs and imaging studies  CBC: Recent Labs  Lab 01/31/19 2056 02/01/19 0324  WBC 6.3 6.1  NEUTROABS 5.0 4.4  HGB 15.7* 14.7  HCT 47.9* 45.3  MCV 95.8 98.3  PLT 263 [id number]   Basic Metabolic Panel: Recent Labs  Lab 01/31/19 2056 02/01/19 0324 02/03/19 0419  NA 139 139 138  K 3.6 3.6 3.7  CL 106 104 106  CO2 24 26 24   GLUCOSE 131* 128* 157*  BUN 19 20 21   CREATININE 0.72 0.78 0.71  CALCIUM 9.5 9.2 9.2   GFR: Estimated Creatinine Clearance: 54 mL/min (by C-G formula based on SCr of 0.71 mg/dL). Liver Function Tests: Recent Labs  Lab 01/31/19 2056 02/01/19 0324 02/03/19  0419  AST 29 24 19   ALT 34 30 26  ALKPHOS 59 52 47  BILITOT 0.8 0.4 0.4  PROT 6.9 6.0* 5.6*  ALBUMIN 3.7 3.1* 2.8*   No results for input(s): LIPASE, AMYLASE in the last 168 hours. No results for input(s): AMMONIA in the last 168 hours. Coagulation Profile: No results for input(s): INR, PROTIME in the last 168 hours. Cardiac Enzymes: No results for input(s): CKTOTAL, CKMB, CKMBINDEX, TROPONINI in the last 168 hours. BNP (last 3 results) No results for input(s): PROBNP in the last 8760 hours. HbA1C: No results for input(s): HGBA1C in the last 72 hours. CBG: No results for input(s): GLUCAP in the last 168 hours. Lipid Profile: No results for input(s): CHOL, HDL, LDLCALC, TRIG, CHOLHDL, LDLDIRECT in the last 72 hours. Thyroid Function Tests: Recent Labs    01/31/19 2056  TSH 3.246   Anemia Panel: Recent Labs    02/01/19 0324  FERRITIN 367*   Sepsis Labs: Recent  Labs  Lab 01/31/19 2056 02/01/19 0324  LATICACIDVEN 0.9 1.0    Recent Results (from the past 240 hour(s))  Blood culture (routine x 2)     Status: None (Preliminary result)   Collection Time: 01/31/19  8:56 PM   Specimen: BLOOD  Result Value Ref Range Status   Specimen Description   Final    BLOOD RIGHT ANTECUBITAL Performed at [facility], [place] [address], [place], [place] [address]    Special Requests   Final    BOTTLES DRAWN AEROBIC AND ANAEROBIC Blood Culture adequate volume Performed at [facility] [address], [place], [place] [address]    Culture   Final    NO GROWTH 2 DAYS Performed at [facility] [address], [place], [place] [address]    Report Status PENDING  Incomplete  Blood culture (routine x 2)     Status: None (Preliminary result)   Collection Time: 01/31/19  8:57 PM   Specimen: BLOOD  Result Value Ref Range Status   Specimen Description   Final    BLOOD LEFT ASSIST CONTROL Performed at [facility] [address],  [place], [place] [address]    Special Requests   Final    BOTTLES DRAWN AEROBIC AND ANAEROBIC Blood Culture adequate volume Performed at [facility] [address] [place], [place] [address]    Culture   Final    NO GROWTH 2 DAYS Performed at [facility] [address], [place], [place] [address]    Report Status PENDING  Incomplete  MRSA PCR Screening     Status: None   Collection Time: 02/01/19  4:21 AM   Specimen: Nasopharyngeal  Result Value Ref Range Status   MRSA by PCR NEGATIVE NEGATIVE Final    Comment:        The GeneXpert MRSA Assay (FDA approved for NASAL specimens only), is one component of a comprehensive MRSA colonization surveillance program. It is not intended to diagnose MRSA infection nor to guide or monitor treatment for MRSA infections. Performed at [facility], [place] [address] [place], [place] [address]       Radiology Studies: No results found.    LOS: 3 days   Time spent: More than 50% of that time was spent in counseling and/or coordination of care.  Antonieta Pert, MD [facility]  02/03/2019, 10:17 AM

## 2019-02-03 NOTE — Progress Notes (Signed)
CMT reported pt had 2 second pause. Pt asleep and asymptomatic. VSS. On call provider made aware. Will continue to monitor.

## 2019-02-03 NOTE — Care Management Important Message (Signed)
Important Message  Patient Details IM Letter given to Cookie McGibboney RN to present to the Patient Name: Taylor Vaughn MRN: [id number] Date of Birth: 1929/06/03   Medicare Important Message Given:  Yes     Kerin Salen 02/03/2019, 10:41 AM

## 2019-02-03 NOTE — Progress Notes (Signed)
OT Cancellation Note  Patient Details Name: Taylor Vaughn MRN: [id number] DOB: 1929/04/22   Cancelled Treatment:    Reason Eval/Treat Not Completed: Other (comment)  Noted plans for SNF- will defer OT eval to SNF  Kari Baars, [place] [phone] Office- [phone], Edwena Felty D 02/03/2019, 8:22 AM

## 2019-02-03 NOTE — TOC Progression Note (Signed)
Transition of Care [facility]) - Progression Note    Patient Details  Name: Cathren Vandoorn MRN: [id number] Date of Birth: 04-Sep-1929  [facility] CM/SW Contact  Purcell Mouton, RN Phone Number: 02/03/2019, 12:31 PM  Clinical Narrative:    Spoke with pt's daughter [name] in length concerning pt's discharge plan on 12/1. [name] do not want pt to go to [facility]. Explained that we do not find new ALF placement for pt. 12/2 [name] daughter returned CM call. Pt only had one bed offer which was [facility]. Daughter [name] yelled and said NO, that pt can not go there and can not be discharged. I am working on getting her help for her return to [place]. Will continue to follow for discharge plans.        Expected Discharge Plan and Services           Expected Discharge Date: 02/02/19                                     Social Determinants of Health (SDOH) Interventions    Readmission Risk Interventions No flowsheet data found.

## 2019-02-04 MED ORDER — HYDRALAZINE HCL 25 MG PO TABS: 25.0000 mg | ORAL_TABLET | Freq: Four times a day (QID) | ORAL | Status: DC | PRN

## 2019-02-04 NOTE — Progress Notes (Signed)
Lab reports to RN to that pt refusing labs. MD notified, requesting to call daughter to help assist with getting pt to agree. No answer at this time.    RN to reattempt to get pt to agree at a later time.   Pt remains sleep at this time.

## 2019-02-04 NOTE — Progress Notes (Signed)
Informed by nursing about abnormal EKG, personally reviewed tele- ? second-degree heart block type I, but hemodynamically stable, EKG was ordered and reviewed personally. 1% heart block. Potassium and mag ordered and will replete to keep potassium at 4 and mag at 2.  Patient has refused lab, informed nursing staff to call family son or daughter while in the room so that patient agrees for the lab  Taylor Vaughn. Currently remains hemodynamically stable.

## 2019-02-04 NOTE — Progress Notes (Signed)
PROGRESS NOTE    Taylor Vaughn  [id number] DOB: 11-12-1929 DOA: 01/31/2019 PCP: Lujean Amel, MD   Brief Narrative: 85 year old female with hypertension, memory issues on Aricept, amlodipine feeling weak for last 2 to 3 days with decreasing activity and becoming more bedbound.  Over the last 2 days PTA having nausea vomiting and diarrhea.  Patient was brought from independent living facility to ER with constellation of symptoms, in the ER work-up showed COVID-19 was positive level creatinine 0.7 LFTs and hemoglobin stable chest x-ray was unremarkable CT head unremarkable patient was admitted for COVID-19 infection with progressive weakness.  Subjective: Seen this morning.  Doing well on room air, no acute events overnight. She told me not to disturb her as she wants to sleep.  She feels somewhat weak but has no other complaint.  Tolerating oral intake.  Assessment & Plan:   COVID-19 virus infection with nausea vomiting diarrhea and generalized weakness: No pulmonary symptoms.On admission troponin is stable 12-13,ferritin was slightyly up at 367 (Nl <307), CRP less than <0.8, D-dimer 0.55 (nl 0.50),currently no leukocytosis, afebrile. repeated CXR 12/2-no infiltrates and pateint is saturating well on RA.continue supportive care, encourage oral intake, no indication for remdesivir and stopped,also has refused iv access despite multiple attempts. Will stop decadron too as she is no hypoxic.  Memory issues/dementia alert awake, follows commands. Mostly requesting not to disturb her or wake her up.Patient not been very compliant with the treatment at times.  Cont supportive care, fall precaution   Hypertension:BP is controlled.  On amlodipine.  SIRS ? On admission:Afebrile no leukocytosis, blood cx neg, do not suspect infection.  Mild Hypokalemia -resolved  Debility/physical deconditioning:continue PT OT and patient continue to have generalized weakness.  Looking into skilled nursing  facility placement.  Body mass index is 29.78 kg/m.   DVT prophylaxis: lovenox. Code Status:  DNR Family Communication: plan of care discussed with patient.  I had called patient's son and daughter and have discussed the care plan and aware about awaiting SNF son name/no:Bill Bartholomae [phone]  Disposition Plan: Remains inpatient awaiting skilled nursing facility placement.  Continue PT OT.    Consultants:  none  Procedures: none  Microbiology: blood cx neg so far, covid 19 ag rapid + 11/29  Antimicrobials: Anti-infectives (From admission, onward)   Start     Dose/Rate Route Frequency Ordered Stop   02/02/19 1000  remdesivir 100 mg in sodium chloride 0.9 % 250 mL IVPB  Status:  Discontinued     100 mg 500 mL/hr over 30 Minutes Intravenous Every 24 hours 02/01/19 0258 02/03/19 1713   02/01/19 0230  remdesivir 200 mg in sodium chloride 0.9 % 250 mL IVPB     200 mg 500 mL/hr over 30 Minutes Intravenous Once 02/01/19 0151 02/01/19 0935       Objective: Vitals:   02/03/19 1405 02/03/19 1952 02/04/19 0519 02/04/19 0638  BP:  (!) 157/78 (!) 190/78 (!) 157/74  Pulse:  62 (!) 57 64  Resp: 18 18 16    Temp:  97.7 F (36.5 C) 97.7 F (36.5 C)   TempSrc:  Oral Oral   SpO2:  92% 93%   Weight:      Height:        Intake/Output Summary (Last 24 hours) at 02/04/2019 1158 Last data filed at 02/04/2019 1000 Gross per 24 hour  Intake 620 ml  Output 0 ml  Net 620 ml   Filed Weights   01/31/19 2051 02/01/19 0300  Weight: 81.6 kg 83.7 kg  Weight change:   Body mass index is 29.78 kg/m.  Intake/Output from previous day: 12/02 0701 - 12/03 0700 In: 620 [P.O.:620] Out: 0  Intake/Output this shift: Total I/O In: 120 [P.O.:120] Out: -   Examination:  General exam: Alert awake, obese, not in acute distress, on room air  HEENT:Oral mucosa moist, Ear/Nose WNL grossly, dentition normal. Respiratory system: Clear lungs bilaterally without wheezing crackles no use of  accessory muscles  Cardiovascular system: S1 & S2 +, No JVD,. Gastrointestinal system: Abdomen soft, NT,ND, BS+ Nervous System:Alert, awake, oriented to self, place moving extremities and grossly nonfocal Extremities: No edema, distal peripheral pulses palpable.  Skin: No rashes,no icterus. MSK: Normal muscle bulk,tone, power  Medications:  Scheduled Meds: . amLODipine  2.5 mg Oral Daily  . dexamethasone  6 mg Oral Daily  . donepezil  5 mg Oral QHS  . enoxaparin (LOVENOX) injection  40 mg Subcutaneous Daily   Continuous Infusions:   Data Reviewed: I have personally reviewed following labs and imaging studies  CBC: Recent Labs  Lab 01/31/19 2056 02/01/19 0324  WBC 6.3 6.1  NEUTROABS 5.0 4.4  HGB 15.7* 14.7  HCT 47.9* 45.3  MCV 95.8 98.3  PLT 263 [id number]   Basic Metabolic Panel: Recent Labs  Lab 01/31/19 2056 02/01/19 0324 02/03/19 0419  NA 139 139 138  K 3.6 3.6 3.7  CL 106 104 106  CO2 24 26 24   GLUCOSE 131* 128* 157*  BUN 19 20 21   CREATININE 0.72 0.78 0.71  CALCIUM 9.5 9.2 9.2   GFR: Estimated Creatinine Clearance: 54 mL/min (by C-G formula based on SCr of 0.71 mg/dL). Liver Function Tests: Recent Labs  Lab 01/31/19 2056 02/01/19 0324 02/03/19 0419  AST 29 24 19   ALT 34 30 26  ALKPHOS 59 52 47  BILITOT 0.8 0.4 0.4  PROT 6.9 6.0* 5.6*  ALBUMIN 3.7 3.1* 2.8*   No results for input(s): LIPASE, AMYLASE in the last 168 hours. No results for input(s): AMMONIA in the last 168 hours. Coagulation Profile: No results for input(s): INR, PROTIME in the last 168 hours. Cardiac Enzymes: No results for input(s): CKTOTAL, CKMB, CKMBINDEX, TROPONINI in the last 168 hours. BNP (last 3 results) No results for input(s): PROBNP in the last 8760 hours. HbA1C: No results for input(s): HGBA1C in the last 72 hours. CBG: No results for input(s): GLUCAP in the last 168 hours. Lipid Profile: No results for input(s): CHOL, HDL, LDLCALC, TRIG, CHOLHDL, LDLDIRECT in the  last 72 hours. Thyroid Function Tests: No results for input(s): TSH, T4TOTAL, FREET4, T3FREE, THYROIDAB in the last 72 hours. Anemia Panel: Recent Labs    02/03/19 1401  FERRITIN 548*   Sepsis Labs: Recent Labs  Lab 01/31/19 2056 02/01/19 0324  LATICACIDVEN 0.9 1.0    Recent Results (from the past 240 hour(s))  Blood culture (routine x 2)     Status: None (Preliminary result)   Collection Time: 01/31/19  8:56 PM   Specimen: BLOOD  Result Value Ref Range Status   Specimen Description   Final    BLOOD RIGHT ANTECUBITAL Performed at [facility], [place] [address] [place], [place] [address]    Special Requests   Final    BOTTLES DRAWN AEROBIC AND ANAEROBIC Blood Culture adequate volume Performed at [facility] [address] [place], [place] [address]    Culture   Final    NO GROWTH 3 DAYS Performed at [facility], [place] [address] [place], [place]  [address]    Report Status PENDING  Incomplete  Blood culture (routine x 2)     Status: None (Preliminary result)   Collection Time: 01/31/19  8:57 PM   Specimen: BLOOD  Result Value Ref Range Status   Specimen Description   Final    BLOOD LEFT ASSIST CONTROL Performed at [facility] [address], [place], [place] [address]    Special Requests   Final    BOTTLES DRAWN AEROBIC AND ANAEROBIC Blood Culture adequate volume Performed at [facility] [address], [place], [place] [address]    Culture   Final    NO GROWTH 3 DAYS Performed at [facility], [place] [address] [place], [place] [address]    Report Status PENDING  Incomplete  MRSA PCR Screening     Status: None   Collection Time: 02/01/19  4:21 AM   Specimen: Nasopharyngeal  Result Value Ref Range Status   MRSA by PCR NEGATIVE NEGATIVE Final    Comment:        The GeneXpert MRSA Assay (FDA approved for NASAL specimens only), is one component of a comprehensive MRSA colonization  surveillance program. It is not intended to diagnose MRSA infection nor to guide or monitor treatment for MRSA infections. Performed at [facility], [place] [address] [place],  [address]       Radiology Studies: Dg Chest Port 1 View  Result Date: 02/03/2019 CLINICAL DATA:  Shortness of breath. COVID-19. EXAM: PORTABLE CHEST 1 VIEW COMPARISON:  01/31/2019 FINDINGS: The heart size and pulmonary vascularity are normal and the lungs are clear. No bone abnormality of significance. IMPRESSION: No active disease. Aortic Atherosclerosis (ICD10-I70.0). Electronically Signed   By: Lorriane Shire M.D.   On: 02/03/2019 14:58      LOS: 4 days   Time spent: More than 50% of that time was spent in counseling and/or coordination of care.  Antonieta Pert, MD [facility]  02/04/2019, 11:58 AM

## 2019-02-04 NOTE — Progress Notes (Signed)
CMT reported one beat of second degree Type 1. Pt asleep at this time and asymptomatic. Dr. Lupita Leash made aware. Tele strip printed and placed on paper chart.

## 2019-02-04 NOTE — Plan of Care (Signed)
  Problem: Education: Goal: Knowledge of General Education information will improve Description: Including pain rating scale, medication(s)/side effects and non-pharmacologic comfort measures Outcome: Progressing   Problem: Health Behavior/Discharge Planning: Goal: Ability to manage health-related needs will improve Outcome: Progressing   Problem: Clinical Measurements: Goal: Ability to maintain clinical measurements within normal limits will improve Outcome: Progressing Goal: Will remain free from infection Outcome: Progressing Goal: Diagnostic test results will improve Outcome: Progressing Goal: Respiratory complications will improve Outcome: Progressing Goal: Cardiovascular complication will be avoided Outcome: Progressing   Problem: Activity: Goal: Risk for activity intolerance will decrease Outcome: Progressing   Problem: Nutrition: Goal: Adequate nutrition will be maintained Outcome: Progressing   Problem: Coping: Goal: Level of anxiety will decrease Outcome: Progressing   Problem: Elimination: Goal: Will not experience complications related to bowel motility Outcome: Completed/Met Goal: Will not experience complications related to urinary retention Outcome: Completed/Met   Problem: Pain Managment: Goal: General experience of comfort will improve Outcome: Completed/Met   Problem: Safety: Goal: Ability to remain free from injury will improve Outcome: Progressing

## 2019-02-05 DIAGNOSIS — R9431 Abnormal electrocardiogram [ECG] [EKG]: Secondary | ICD-10-CM | POA: Diagnosis not present

## 2019-02-05 DIAGNOSIS — I491 Atrial premature depolarization: Secondary | ICD-10-CM | POA: Diagnosis not present

## 2019-02-05 DIAGNOSIS — I10 Essential (primary) hypertension: Secondary | ICD-10-CM | POA: Diagnosis not present

## 2019-02-05 MED ORDER — AMLODIPINE BESYLATE 5 MG PO TABS
5.0000 mg | ORAL_TABLET | Freq: Every day | ORAL | Status: DC
  Administered 2019-02-06 – 2019-02-10 (×5): 5 mg via ORAL
  Filled 2019-02-05 (×5): qty 1

## 2019-02-05 MED ORDER — BENZONATATE 100 MG PO CAPS
100.0000 mg | ORAL_CAPSULE | Freq: Three times a day (TID) | ORAL | Status: DC | PRN
  Administered 2019-02-06 – 2019-02-17 (×11): 100 mg via ORAL
  Filled 2019-02-05 (×14): qty 1

## 2019-02-05 NOTE — Consult Note (Signed)
Cardiology Consultation:   Patient ID: Taylor Vaughn; [id number]; Oct 13, 1929   Admit date: 01/31/2019 Date of Consult: 02/05/2019  Primary Care Provider: Lujean Amel, MD Primary Cardiologist: Quay Burow, MD Primary Electrophysiologist:  None   Patient Profile:   Taylor Vaughn is a 85 y.o. female with a PMH of HTN with moderate renal artery stenosis and dementia, who is being seen today for the evaluation of heart block at the request of Dr. Lupita Leash.  History of Present Illness:   [name] was admitted to [facility] 01/31/2019 for management of COVID-19 c/b N/V/D. Her symptoms were improving with supportive care and she was awaiting a bed at a SNF. While being monitored on telemetry she was reported to have an episodes of second degree AV block type 1. EKG was obtained and after careful review, it appears the patient had a blocked PAC which appeared similar to a dropped beat. The p wave morphology changes prior to the missed beat suggesting ectopic beat. This was an incidental finding and the patient had no cardiac complaints throughout the admission.   She does not follow regularly with cardiology regularly, though was last seen by Dr. Gwenlyn Found 12/2016. Her blood pressure was 162/85 at that time and she was continued on low dose amlodipine and metoprolol (metoprolol subsequently discontinued), and recommended to follow-up as needed. Her renal artery stenosis was felt to be physiologically insignificant and no further monitoring was recommended.   Hospital course: hypertensive and intermittently bradycardic to the 50s, otherwise VSS. Labs notable for electrolytes wnl, Cr 0.7, CBC wnl, HsTrop 12>13. EKG on admission with sinus rhythm with rate 70 bpm, non-specific IVCD, no STE/D. Repeat EKG yesterday with sinus rhythm with 1st degree AV block, rate 68 bpm, PAC, no STE/D. Cardiology asked to evaluate for possible 2nd degree AV block.   Past Medical History:  Diagnosis Date  . Acute chest  wall pain 07/22/2011  . Adenomatous colon polyp    3 more polyps on colonoscopy 04/27/14 by [facility]; repeat colonoscopy 1 yr  . Allergic rhinitis 11/22/2011  . Candidal skin infection 10/03/2011  . Cataract 01/11/2013  . Cervical cancer screening 03/19/2012  . Chicken pox as a child  . Depression   . Diverticulosis 05/16/2013  . Grief reaction 10/03/2011  . Hyperlipidemia    statin intolerant  . Hypertension   . Insomnia   . Measles 15  . Medicare annual wellness visit, subsequent 07/22/2011   Follows with Dr Erlene Quan of GI Follow with Dr Sallyanne Kuster of Cardiology   . Memory loss   . MRSA (methicillin resistant staph aureus) culture positive 10/03/2011  . Mumps as a child  . Presbycusis of both ears 12/2013   with tinnitus of both ears (Dr. Erik Obey)  . Preventative health care 07/22/2011  . RAS (renal artery stenosis) ([place])   . Renal artery stenosis ([place]) 11/24/2011   bilat, worse 12/2013, but no stenting of renal arteries is indicated as of 03/2014 per Dr. Gwenlyn Found.  Stable on f/u ultrasound 02/2015 per Dr. Kennon Holter f/u.  . Right shoulder strain 10/03/2011  . Tinea corporis 11/15/2012   Under both breasts R>L, left foot.   . Urinary frequency 07/22/2011  . Vertigo   . Whooping cough 85 yrs old    Past Surgical History:  Procedure Laterality Date  . CHOLECYSTECTOMY  1992  . colonoscopy with polypectomy  multiple; most recent 04/27/14   adenomas (many) +high grade dysplasia on most recent scope 03/2012; repeat 04/27/14 showed 3 more polyps; needs repeat colonoscopy  1 yr  . [facility] MULTIPLE W/SPECT  05/2009   EF 74%. NORMAL MYOCARDIAL PERFUSION STUDY.  Marland Kitchen RENAL DUPLEX  05/2011   RRA- 60-99% REDUCTION. LEFT MID + DISTAL RA- 1-59% DIAMETER REDUCTION BY VELOCITIES VS FIBROMUSCULAR DYSPLASIA WHICH CAN NOT BE EXCLUDED. R & L KIDNEYS- NORMAL.  Marland Kitchen SKIN BIOPSY     multiple, all benign     Home Medications:  Prior to Admission medications   Medication Sig Start Date End Date Taking? Authorizing  Provider  amLODipine (NORVASC) 2.5 MG tablet Take 2.5 mg by mouth daily. 12/30/18  Yes [provider]  donepezil (ARICEPT) 5 MG tablet Take 5 mg by mouth at bedtime. 12/30/18  Yes [provider]  Fish Oil-Coenzyme Q10 (FISH OIL PLUS CO Q-10) 1000-30 MG CAPS Take 1 tablet by mouth daily.   Yes [provider]  Multiple Vitamins-Minerals (CENTRUM SILVER ADULT 50+ PO) Take 1 tablet by mouth daily.   Yes [provider]  dexamethasone (DECADRON) 6 MG tablet Take 1 tablet (6 mg total) by mouth daily for 5 days. 02/03/19 02/08/19  Heath Lark D, DO    Inpatient Medications: Scheduled Meds: . amLODipine  2.5 mg Oral Daily  . donepezil  5 mg Oral QHS  . enoxaparin (LOVENOX) injection  40 mg Subcutaneous Daily   Continuous Infusions:  PRN Meds: acetaminophen **OR** acetaminophen, benzonatate, hydrALAZINE, ondansetron **OR** ondansetron (ZOFRAN) IV  Allergies:    Allergies  Allergen Reactions  . Lisinopril Cough  . Latex     She does not like the feel of Latex (or nail polish, make-up or perfume).  . Statins     Muscle cramps, rashes, loss of hair  . Keflex [Cephalexin] Rash  . Losartan     phlegm    Social History:   Social History   Socioeconomic History  . Marital status: Widowed    Spouse name: Not on file  . Number of children: 3  . Years of education: College  . Highest education level: Not on file  Occupational History  . Occupation: Retired Producer, television/film/video  Social Needs  . Financial resource strain: Not on file  . Food insecurity    Worry: Not on file    Inability: Not on file  . Transportation needs    Medical: Not on file    Non-medical: Not on file  Tobacco Use  . Smoking status: Former Smoker    Types: Cigarettes  . Smokeless tobacco: Never Used  . Tobacco comment: smoked for 3 years in 30's  Substance and Sexual Activity  . Alcohol use: No  . Drug use: No  . Sexual activity: Never  Lifestyle  . Physical activity     Days per week: Not on file    Minutes per session: Not on file  . Stress: Not on file  Relationships  . Social Herbalist on phone: Not on file    Gets together: Not on file    Attends religious service: Not on file    Active member of club or organization: Not on file    Attends meetings of clubs or organizations: Not on file    Relationship status: Not on file  . Intimate partner violence    Fear of current or ex partner: Not on file    Emotionally abused: Not on file    Physically abused: Not on file    Forced sexual activity: Not on file  Other Topics Concern  . Not on file  Social History Narrative   Lives at [facility] (independent living).   Right-handed.   2 cups caffeine per day.    Family History:    Family History  Problem Relation Age of Onset  . Breast cancer Mother 24       remission, late 43's lung cancer  . Hypertension Mother        ?  . Lung cancer Mother   . Angina Father   . Diabetes type II Father   . Heart disease Father   . Obesity Sister   . Diabetes type II Sister   . Hyperlipidemia Sister   . Heart attack Sister   . Spina bifida Brother   . Constipation Daughter   . Hypertension Daughter   . Hyperlipidemia Daughter   . Obesity Son   . Hypertension Son   . Obesity Maternal Grandmother   . Heart attack Maternal Grandmother   . Stroke Paternal Grandfather      ROS:  Please see the history of present illness.  ROS  All other ROS reviewed and negative.     Physical Exam/Data:   Vitals:   02/04/19 1450 02/04/19 2248 02/05/19 0514 02/05/19 0900  BP: (!) 150/68 (!) 151/71 (!) 187/82 (!) 191/75  Pulse: 62 62 64 65  Resp: (!) 22 16 16    Temp: (!) 97.4 F (36.3 C) 98.4 F (36.9 C) (!) 97.4 F (36.3 C) 98 F (36.7 C)  TempSrc: Oral Oral Oral Oral  SpO2: 95% 92% 95% 94%  Weight:      Height:        Intake/Output Summary (Last 24 hours) at 02/05/2019 1327 Last data filed at 02/05/2019 0900 Gross per 24 hour  Intake  120 ml  Output 300 ml  Net -180 ml   Filed Weights   01/31/19 2051 02/01/19 0300  Weight: 81.6 kg 83.7 kg   Body mass index is 29.78 kg/m.  Physical exam per MD below  EKG:  The EKG was personally reviewed and demonstrates:  on admission with sinus rhythm with rate 70 bpm, non-specific IVCD, no STE/D. Repeat EKG yesterday with sinus rhythm with 1st degree AV block, rate 68 bpm, PAC, no STE/D. Telemetry:  Telemetry was personally reviewed and demonstrates:  Sinus rhythm/sinus brady with first degree AV block, PACs, 2 blocked PACs, no evidence of high grade AV block.   Relevant CV Studies: None  Laboratory Data:  Chemistry Recent Labs  Lab 01/31/19 2056 02/01/19 0324 02/03/19 0419  NA 139 139 138  K 3.6 3.6 3.7  CL 106 104 106  CO2 24 26 24   GLUCOSE 131* 128* 157*  BUN 19 20 21   CREATININE 0.72 0.78 0.71  CALCIUM 9.5 9.2 9.2  GFRNONAA >60 >60 >60  GFRAA >60 >60 >60  ANIONGAP 9 9 8     Recent Labs  Lab 01/31/19 2056 02/01/19 0324 02/03/19 0419  PROT 6.9 6.0* 5.6*  ALBUMIN 3.7 3.1* 2.8*  AST 29 24 19   ALT 34 30 26  ALKPHOS 59 52 47  BILITOT 0.8 0.4 0.4   Hematology Recent Labs  Lab 01/31/19 2056 02/01/19 0324  WBC 6.3 6.1  RBC 5.00 4.61  HGB 15.7* 14.7  HCT 47.9* 45.3  MCV 95.8 98.3  MCH 31.4 31.9  MCHC 32.8 32.5  RDW 13.4 13.4  PLT 263 245   Cardiac EnzymesNo results for input(s): TROPONINI in the last 168 hours. No results for input(s): TROPIPOC in the last 168 hours.  BNPNo results for input(s): BNP, PROBNP  in the last 168 hours.  DDimer  Recent Labs  Lab 02/01/19 1426  DDIMER 0.55*    Radiology/Studies:  Dg Chest Port 1 View  Result Date: 02/03/2019 CLINICAL DATA:  Shortness of breath. COVID-19. EXAM: PORTABLE CHEST 1 VIEW COMPARISON:  01/31/2019 FINDINGS: The heart size and pulmonary vascularity are normal and the lungs are clear. No bone abnormality of significance. IMPRESSION: No active disease. Aortic Atherosclerosis (ICD10-I70.0).  Electronically Signed   By: Lorriane Shire M.D.   On: 02/03/2019 14:58    Assessment and Plan:   1. 1st degree AV block with occasional blocked PACs: telemetry reviewed - appears she has 1st degree AV block with occasional blocked PACs. No evidence of high grade AV block  - No further cardiac monitoring needed.  - Avoid AV nodal blocking agents - Consider alternative to donepezil, as this medication can cause AV block and bradycardia.   2. HTN: BP poorly controlled.  - Will increase amlodipine to 5mg  daily  For questions or updates, please contact [place] Please consult [email or web address] for contact info under Cardiology/STEMI.   Signed, Abigail Butts, PA-C  02/05/2019 1:27 PM [phone]

## 2019-02-05 NOTE — Progress Notes (Signed)
PROGRESS NOTE    Taylor Vaughn  [id number] DOB: 1929/09/19 DOA: 01/31/2019 PCP: Lujean Amel, MD   Brief Narrative: 85 year old female with hypertension, memory issues on Aricept, amlodipine feeling weak for last 2 to 3 days with decreasing activity and becoming more bedbound.  Over the last 2 days PTA having nausea vomiting and diarrhea.  Patient was brought from independent living facility to ER with constellation of symptoms, in the ER work-up showed COVID-19 was positive level creatinine 0.7 LFTs and hemoglobin stable chest x-ray was unremarkable CT head unremarkable patient was admitted for COVID-19 infection with progressive weakness.  Subjective:  Patient was yelling and asking not to be disturbed, wants to sleep. Nursing reports that patient has been yelling this morning. BP high. Tele with dropped p waves. HR stable.  Assessment & Plan:   COVID-19 virus infection with nausea vomiting diarrhea and generalized weakness: No pulmonary symptoms.On admission troponin is stable 12-13,ferritin was slightyly up at 367 (Nl <307), CRP less than <0.8, D-dimer 0.55 (nl 0.50), and with no leukocytosis, afebrile. repeated CXR 12/2-no infiltrates and pateint is saturating well on RA.continue supportive care, encourage oral intake, no indication for remdesivir and has been stopped-also has refused iv access despite multiple attempts. Off decadron too as she is not hypoxic. Continue supportive care.  Memory issues/dementia alert awake, but she has been agitated refusing care refusing medication asking not to disturb her and let her sleep.  During daytime and evening she does become more compliant and iv was placed.  Hypertension:BP is poorly controlled.Increase amlodipine to 5 mg.Monitor.Cont prn meds.  SIRS ? On admission:Afebrile no leukocytosis, blood cx neg, do not suspect infection.  Mild Hypokalemia:resolved.  Arrhythmia/w dropped P wavefirst degree AV block:cardioloy has been  consulted, will need outpatient follow-up.  Debility/physical deconditioning:Continue PT OT and patient continue to have generalized weakness.  Looking into skilled nursing facility placement.  Body mass index is 29.78 kg/m.   DVT prophylaxis:Lovenox. Code Status:DNR Family Communication:Plan of care discussed with patient.  I had called patient's son and daughter and have discussed the care plan and aware about awaiting SNF son name/no:Bill Bartholomae [phone]. I Called her daughter again-She reports she is working on ALF/SNF placement-but is quarantined for covid exposure and SHE was tested today.  She does not want the patient to go to [facility] from as she has bed offer.  Disposition Plan:Remains inpatient awaiting skilled nursing facility placement.Continue PT OT.   Consultants:none. Procedures:none. Microbiology: Blood cx neg so far, covid 19 ag rapid + 11/29.  Antimicrobials: Anti-infectives (From admission, onward)   Start     Dose/Rate Route Frequency Ordered Stop   02/02/19 1000  remdesivir 100 mg in sodium chloride 0.9 % 250 mL IVPB  Status:  Discontinued     100 mg 500 mL/hr over 30 Minutes Intravenous Every 24 hours 02/01/19 0258 02/03/19 1713   02/01/19 0230  remdesivir 200 mg in sodium chloride 0.9 % 250 mL IVPB     200 mg 500 mL/hr over 30 Minutes Intravenous Once 02/01/19 0151 02/01/19 0935       Objective: Vitals:   02/04/19 1450 02/04/19 2248 02/05/19 0514 02/05/19 0900  BP: (!) 150/68 (!) 151/71 (!) 187/82 (!) 191/75  Pulse: 62 62 64 65  Resp: (!) 22 16 16    Temp: (!) 97.4 F (36.3 C) 98.4 F (36.9 C) (!) 97.4 F (36.3 C) 98 F (36.7 C)  TempSrc: Oral Oral Oral Oral  SpO2: 95% 92% 95% 94%  Weight:  Height:        Intake/Output Summary (Last 24 hours) at 02/05/2019 1613 Last data filed at 02/05/2019 0900 Gross per 24 hour  Intake 120 ml  Output 300 ml  Net -180 ml   Filed Weights   01/31/19 2051 02/01/19 0300  Weight: 81.6 kg 83.7  kg   Weight change:   Body mass index is 29.78 kg/m.  Intake/Output from previous day: 12/03 0701 - 12/04 0700 In: 180 [P.O.:180] Out: 300 [Urine:300] Intake/Output this shift: Total I/O In: 60 [P.O.:60] Out: -   Examination: General exam:Alert awake,agitated,on room air,not in distress.   HEENT:Oral mucosa moist,Ear/Nose WNL grossly, dentition normal. Respiratory system:Bilaterally clear lungs, no use of accessory muscles.   Cardiovascular system:S1 & S2 +, No JVD,. Gastrointestinal system:Abdomen soft, NT,ND, BS+ Nervous System:Alert,awake, oriented to self, place, non focal on exam Extremities:No edema,distal peripheral pulses palpable.  Skin:No rashes,no icterus. [id number] muscle bulk,tone, power  Medications:  Scheduled Meds: . [START ON 02/06/2019] amLODipine  5 mg Oral Daily  . donepezil  5 mg Oral QHS  . enoxaparin (LOVENOX) injection  40 mg Subcutaneous Daily   Continuous Infusions:   Data Reviewed: I have personally reviewed following labs and imaging studies  CBC: Recent Labs  Lab 01/31/19 2056 02/01/19 0324  WBC 6.3 6.1  NEUTROABS 5.0 4.4  HGB 15.7* 14.7  HCT 47.9* 45.3  MCV 95.8 98.3  PLT 263 [id number]   Basic Metabolic Panel: Recent Labs  Lab 01/31/19 2056 02/01/19 0324 02/03/19 0419  NA 139 139 138  K 3.6 3.6 3.7  CL 106 104 106  CO2 24 26 24   GLUCOSE 131* 128* 157*  BUN 19 20 21   CREATININE 0.72 0.78 0.71  CALCIUM 9.5 9.2 9.2   GFR: Estimated Creatinine Clearance: 54 mL/min (by C-G formula based on SCr of 0.71 mg/dL). Liver Function Tests: Recent Labs  Lab 01/31/19 2056 02/01/19 0324 02/03/19 0419  AST 29 24 19   ALT 34 30 26  ALKPHOS 59 52 47  BILITOT 0.8 0.4 0.4  PROT 6.9 6.0* 5.6*  ALBUMIN 3.7 3.1* 2.8*   No results for input(s): LIPASE, AMYLASE in the last 168 hours. No results for input(s): AMMONIA in the last 168 hours. Coagulation Profile: No results for input(s): INR, PROTIME in the last 168 hours. Cardiac Enzymes:  No results for input(s): CKTOTAL, CKMB, CKMBINDEX, TROPONINI in the last 168 hours. BNP (last 3 results) No results for input(s): PROBNP in the last 8760 hours. HbA1C: No results for input(s): HGBA1C in the last 72 hours. CBG: No results for input(s): GLUCAP in the last 168 hours. Lipid Profile: No results for input(s): CHOL, HDL, LDLCALC, TRIG, CHOLHDL, LDLDIRECT in the last 72 hours. Thyroid Function Tests: No results for input(s): TSH, T4TOTAL, FREET4, T3FREE, THYROIDAB in the last 72 hours. Anemia Panel: Recent Labs    02/03/19 1401  FERRITIN 548*   Sepsis Labs: Recent Labs  Lab 01/31/19 2056 02/01/19 0324  LATICACIDVEN 0.9 1.0    Recent Results (from the past 240 hour(s))  Blood culture (routine x 2)     Status: None (Preliminary result)   Collection Time: 01/31/19  8:56 PM   Specimen: BLOOD  Result Value Ref Range Status   Specimen Description   Final    BLOOD RIGHT ANTECUBITAL Performed at [facility], [place] [address], [place], [place] [address]    Special Requests   Final    BOTTLES DRAWN AEROBIC AND ANAEROBIC Blood Culture adequate volume Performed at [facility], [place] [address] [place], [place] [address]    Culture   Final    NO GROWTH 4 DAYS Performed at [facility], [place] [address] [place], [place] [address]    Report Status PENDING  Incomplete  Blood culture (routine x 2)     Status: None (Preliminary result)   Collection Time: 01/31/19  8:57 PM   Specimen: BLOOD  Result Value Ref Range Status   Specimen Description   Final    BLOOD LEFT ASSIST CONTROL Performed at [place] [address], [place], [place] [address]    Special Requests   Final    BOTTLES DRAWN AEROBIC AND ANAEROBIC Blood Culture adequate volume Performed at [place] [address], [place], [place] [address]    Culture   Final    NO GROWTH 4 DAYS Performed at [facility], [place] [address], [place], [place] [address]    Report Status PENDING  Incomplete  MRSA PCR Screening     Status: None   Collection Time: 02/01/19  4:21 AM   Specimen: Nasopharyngeal  Result Value Ref Range Status   MRSA by PCR NEGATIVE NEGATIVE Final    Comment:        The GeneXpert MRSA Assay (FDA approved for NASAL specimens only), is one component of a comprehensive MRSA colonization surveillance program. It is not intended to diagnose MRSA infection nor to guide or monitor treatment for MRSA infections. Performed at [facility], [place] [address], [place],  [address]       Radiology Studies: No results found.    LOS: 5 days   Time spent: More than 50% of that time was spent in counseling and/or coordination of care.  Antonieta Pert, MD [facility]  02/05/2019, 4:13 PM

## 2019-02-05 NOTE — Progress Notes (Signed)
Spoke with patients daughter and gave update, questions answered. Also spoke with MD to call and give update as well.

## 2019-02-06 LAB — CULTURE, BLOOD (ROUTINE X 2)
Culture: NO GROWTH
Culture: NO GROWTH
Special Requests: ADEQUATE
Special Requests: ADEQUATE

## 2019-02-06 NOTE — Progress Notes (Addendum)
PROGRESS NOTE    Taylor Vaughn  [id number] DOB: 15-Aug-1929 DOA: 01/31/2019 PCP: Lujean Amel, MD   Brief Narrative: 85 year old female with hypertension, memory issues on Aricept, amlodipine feeling weak for last 2 to 3 days with decreasing activity and becoming more bedbound.  Over the last 2 days PTA having nausea vomiting and diarrhea.  Patient was brought from independent living facility to ER with constellation of symptoms, in the ER work-up showed COVID-19 was positive level creatinine 0.7 LFTs and hemoglobin stable chest x-ray was unremarkable CT head unremarkable patient was admitted for COVID-19 infection with progressive weakness.  Subjective: Patient is much more calm today alert awake no complaints. Following commands and compliant with treatment  Assessment & Plan:   COVID-19 virus infection with nausea vomiting diarrhea and generalized weakness: No pulmonary symptoms.On admission troponin is stable 12-13,ferritin was slightyly up at 367 (Nl <307), CRP less than <0.8, D-dimer 0.55 (nl 0.50), and with no leukocytosis, afebrile. repeated CXR 12/2-no infiltrates and pateint is saturating well on RA.continue supportive care, encourage oral intake-Remdesivir stopped after 1st dose as did not meet criteria and she was also refusing iv. Continue supportive care. I discussed w Dr Baxter Flattery- as family requesting retest- can check after 1 wk which will be 12/6 and if negative then can retest in 24 hrs and if 2nd one is negative, she  can come off isolation.   Memory issues/dementia alert awake.  Calm and compliant however at times he gets agitated does not want to be disturbed does not follow instruction.  Hypertension:BP was poorly controlled.Increased amlodipine to 5 mg and BP appears much improved. Monitor.  SIRS ? On admission:Afebrile no leukocytosis, blood cx neg, do not suspect infection.  Mild Hypokalemia:resolved.  Arrhythmia/w dropped P wavefirst degree AV  block:cardioloy has been consulted, will need outpatient follow-up.  No further inpatient work-up needed as evidence of high degree AV block.  Debility/physical deconditioning:Continue PT OT and patient continue to have generalized weakness.  Looking into skilled nursing facility placement.  Body mass index is 29.78 kg/m.   DVT prophylaxis:Lovenox. Code Status:DNR Family Communication:Plan of care discussed with patient.  I had called patient's son and daughter and have discussed the care plan and aware about awaiting SNF son name/no:Bill Bartholomae [phone]. I  Again called her daughter 12/4. She reports she is working on ALF/SNF placement-but is quarantined for covid exposure and she was tested today.She does not want the patient to go to [facility] from as she has bed offer. I spoke w daughter again 12/5-requesting covid re-test. After I discussed w Dr Graylon Good- we can check after 1 wk which will be tomorrow and if negative then can retest in 24 hrs and if 2nd one is negative, she  can come off isolation. She reports caregiver waiting on covid test.  Disposition Plan:Remains inpatient awaiting skilled nursing facility placement. Continue PT OT.   Consultants:Dr Graylon Good over the phone. Procedures:None. Microbiology:Blood cx neg so far, covid 19 ag rapid + 11/29.  Antimicrobials: Anti-infectives (From admission, onward)   Start     Dose/Rate Route Frequency Ordered Stop   02/02/19 1000  remdesivir 100 mg in sodium chloride 0.9 % 250 mL IVPB  Status:  Discontinued     100 mg 500 mL/hr over 30 Minutes Intravenous Every 24 hours 02/01/19 0258 02/03/19 1713   02/01/19 0230  remdesivir 200 mg in sodium chloride 0.9 % 250 mL IVPB     200 mg 500 mL/hr over 30 Minutes Intravenous Once 02/01/19 0151 02/01/19  0935       Objective: Vitals:   02/05/19 0514 02/05/19 0900 02/05/19 2220 02/06/19 0522  BP: (!) 187/82 (!) 191/75 (!) 157/66 (!) 157/71  Pulse: 64 65  72  Resp: 16  18 16    Temp: (!) 97.4 F (36.3 C) 98 F (36.7 C) 99.8 F (37.7 C) 98.7 F (37.1 C)  TempSrc: Oral Oral Oral Oral  SpO2: 95% 94% 92% 91%  Weight:      Height:        Intake/Output Summary (Last 24 hours) at 02/06/2019 1058 Last data filed at 02/06/2019 0546 Gross per 24 hour  Intake 580 ml  Output 650 ml  Net -70 ml   Filed Weights   01/31/19 2051 02/01/19 0300  Weight: 81.6 kg 83.7 kg   Weight change:   Body mass index is 29.78 kg/m.  Intake/Output from previous day: 12/04 0701 - 12/05 0700 In: 640 [P.O.:640] Out: 650 [Urine:650] Intake/Output this shift: No intake/output data recorded.  Examination: General exam: Alert awake, not in acute distress, on room air.   HEENT:Oral mucosa moist,Ear/Nose WNL grossly, dentition normal. Respiratory system:Bilaterally clear breath sounds, no use of accessory muscles, nontender.   Cardiovascular system:S1 & S2 +, No JVD,. Gastrointestinal system:Abdomen soft, NT,ND, BS+ Nervous System:Alert,awake, oriented to self, place, non focal on exam Extremities:No edema,distal peripheral pulses palpable.  Skin:No rashes,no icterus. [id number] muscle bulk,tone, power  Medications:  Scheduled Meds: . amLODipine  5 mg Oral Daily  . donepezil  5 mg Oral QHS  . enoxaparin (LOVENOX) injection  40 mg Subcutaneous Daily   Continuous Infusions:   Data Reviewed: I have personally reviewed following labs and imaging studies  CBC: Recent Labs  Lab 01/31/19 2056 02/01/19 0324  WBC 6.3 6.1  NEUTROABS 5.0 4.4  HGB 15.7* 14.7  HCT 47.9* 45.3  MCV 95.8 98.3  PLT 263 [id number]   Basic Metabolic Panel: Recent Labs  Lab 01/31/19 2056 02/01/19 0324 02/03/19 0419  NA 139 139 138  K 3.6 3.6 3.7  CL 106 104 106  CO2 24 26 24   GLUCOSE 131* 128* 157*  BUN 19 20 21   CREATININE 0.72 0.78 0.71  CALCIUM 9.5 9.2 9.2   GFR: Estimated Creatinine Clearance: 54 mL/min (by C-G formula based on SCr of 0.71 mg/dL). Liver Function Tests: Recent Labs  Lab  01/31/19 2056 02/01/19 0324 02/03/19 0419  AST 29 24 19   ALT 34 30 26  ALKPHOS 59 52 47  BILITOT 0.8 0.4 0.4  PROT 6.9 6.0* 5.6*  ALBUMIN 3.7 3.1* 2.8*   No results for input(s): LIPASE, AMYLASE in the last 168 hours. No results for input(s): AMMONIA in the last 168 hours. Coagulation Profile: No results for input(s): INR, PROTIME in the last 168 hours. Cardiac Enzymes: No results for input(s): CKTOTAL, CKMB, CKMBINDEX, TROPONINI in the last 168 hours. BNP (last 3 results) No results for input(s): PROBNP in the last 8760 hours. HbA1C: No results for input(s): HGBA1C in the last 72 hours. CBG: No results for input(s): GLUCAP in the last 168 hours. Lipid Profile: No results for input(s): CHOL, HDL, LDLCALC, TRIG, CHOLHDL, LDLDIRECT in the last 72 hours. Thyroid Function Tests: No results for input(s): TSH, T4TOTAL, FREET4, T3FREE, THYROIDAB in the last 72 hours. Anemia Panel: Recent Labs    02/03/19 1401  FERRITIN 548*   Sepsis Labs: Recent Labs  Lab 01/31/19 2056 02/01/19 0324  LATICACIDVEN 0.9 1.0    Recent Results (from the past 240 hour(s))  Blood culture (routine x  2)     Status: None (Preliminary result)   Collection Time: 01/31/19  8:56 PM   Specimen: BLOOD  Result Value Ref Range Status   Specimen Description   Final    BLOOD RIGHT ANTECUBITAL Performed at [facility], [place] [address] [place], [place] [address]    Special Requests   Final    BOTTLES DRAWN AEROBIC AND ANAEROBIC Blood Culture adequate volume Performed at [place] [address] [place], [place] [address]    Culture   Final    NO GROWTH 4 DAYS Performed at [facility], [place] [address], [place], [place] [address]    Report Status PENDING  Incomplete  Blood culture (routine x 2)     Status: None (Preliminary result)   Collection Time: 01/31/19  8:57 PM   Specimen: BLOOD  Result Value Ref Range Status   Specimen Description   Final    BLOOD LEFT  ASSIST CONTROL Performed at [facility] [address] [place], [place] [address]    Special Requests   Final    BOTTLES DRAWN AEROBIC AND ANAEROBIC Blood Culture adequate volume Performed at [facility] [address], [place], [place] [address]    Culture   Final    NO GROWTH 4 DAYS Performed at [facility], [place] [address], [place], [place] [address]    Report Status PENDING  Incomplete  MRSA PCR Screening     Status: None   Collection Time: 02/01/19  4:21 AM   Specimen: Nasopharyngeal  Result Value Ref Range Status   MRSA by PCR NEGATIVE NEGATIVE Final    Comment:        The GeneXpert MRSA Assay (FDA approved for NASAL specimens only), is one component of a comprehensive MRSA colonization surveillance program. It is not intended to diagnose MRSA infection nor to guide or monitor treatment for MRSA infections. Performed at [facility], [place] [address], [place], [place] [address]       Radiology Studies: No results found.    LOS: 6 days   Time spent: More than 50% of that time was spent in counseling and/or coordination of care.  Antonieta Pert, MD [facility]  02/06/2019, 10:58 AM

## 2019-02-06 NOTE — Progress Notes (Signed)
PT Cancellation Note  Patient Details Name: Taylor Vaughn MRN: [id number] DOB: August 15, 1929   Cancelled Treatment:    Reason Eval/Treat Not Completed: Patient declined, no reason specified. PT walked into room and pt immediately said "Go away. Go Away.".  PT explained who she was and pt yelled, "GO AWAY!".  Nurse reports that pt is more cooperative in the afternoons.  Will check back as schedule permits.   Galen Manila 02/06/2019, 12:02 PM

## 2019-02-06 NOTE — Progress Notes (Signed)
Patients daughter spoke with the charge nurse today as I was in patients room. I had her place call into patients room and daughter spoke with patient today. I also followed up with CSW who will call daughter to work on d/c planning

## 2019-02-06 NOTE — TOC Progression Note (Signed)
Transition of Care [facility]) - Progression Note    Patient Details  Name: Taylor Vaughn MRN: [id number] Date of Birth: [birth year]  Transition of Care [facility]) CM/SW Sharp, LCSW Phone Number: 02/06/2019, 3:55 PM  Clinical Narrative:   CSW spoke with patients [name] via phone. Taylor Vaughn stated that patient is from [facility] which is an independent living facility. Taylor Vaughn stated that she has spoken to the facility and patient will be able to to return and she has arranged for a caregiver to stay with patient through out the day and she has also arranged for someone to be with patient at night. Taylor Vaughn stated that because patient had tested positive for covid at the hospital both she and the caregiver at the facility has had to quarantine and get a covid test themselves and are now waiting for the results. Taylor Vaughn requested that patient be retested for covid stating she does not think patient has covid "because patient has not been showing sign of it".  CSW spoke to [place] about patient going to a SNF. Taylor Vaughn stated she does not want patient to go to [facility] because she has had bad experience with the facility in the past and does not feel comfortable sending patient there. CSW asked if she would feel comfortable about sending patient to [facility] since they made a bed offer. Taylor Vaughn stated that because [facility] sister facility [facility] she would not approve of that facility either. CSW explained to Taylor Vaughn that only a few facilities in the area can take  Positive covid patients. Taylor Vaughn stated she will most likely have to take patient back home. CSW will continue to follow for discharge needs         Expected Discharge Plan and Services           Expected Discharge Date: 02/02/19                                     Social Determinants of Health (SDOH) Interventions    Readmission Risk Interventions No flowsheet data found.

## 2019-02-07 ENCOUNTER — Inpatient Hospital Stay (HOSPITAL_COMMUNITY): Payer: Medicare Other

## 2019-02-07 LAB — COMPREHENSIVE METABOLIC PANEL
ALT: 38 U/L (ref 0–44)
AST: 21 U/L (ref 15–41)
Albumin: 2.7 g/dL — ABNORMAL LOW (ref 3.5–5.0)
Alkaline Phosphatase: 54 U/L (ref 38–126)
Anion gap: 9 (ref 5–15)
BUN: 21 mg/dL (ref 8–23)
CO2: 27 mmol/L (ref 22–32)
Calcium: 9.4 mg/dL (ref 8.9–10.3)
Chloride: 101 mmol/L (ref 98–111)
Creatinine, Ser: 0.88 mg/dL (ref 0.44–1.00)
GFR calc Af Amer: >60 mL/min (ref >60)
GFR calc non Af Amer: 59 mL/min — ABNORMAL LOW (ref >60)
Glucose, Bld: 121 mg/dL — ABNORMAL HIGH (ref 70–99)
Potassium: 4.3 mmol/L (ref 3.5–5.1)
Sodium: 137 mmol/L (ref 135–145)
Total Bilirubin: 0.5 mg/dL (ref 0.3–1.2)
Total Protein: 6.4 g/dL — ABNORMAL LOW (ref 6.5–8.1)

## 2019-02-07 LAB — CBC
HCT: 46.9 % — ABNORMAL HIGH (ref 36.0–46.0)
Hemoglobin: 15.3 g/dL — ABNORMAL HIGH (ref 12.0–15.0)
MCH: 31 pg (ref 26.0–34.0)
MCHC: 32.6 g/dL (ref 30.0–36.0)
MCV: 95.1 fL (ref 80.0–100.0)
Platelets: 345 10*3/uL (ref 150–400)
RBC: 4.93 MIL/uL (ref 3.87–5.11)
RDW: 13.4 % (ref 11.5–15.5)
WBC: 14.8 10*3/uL — ABNORMAL HIGH (ref 4.0–10.5)
nRBC: 0 % (ref 0.0–0.2)

## 2019-02-07 LAB — PROCALCITONIN: Procalcitonin: <0.1 ng/mL

## 2019-02-07 LAB — D-DIMER, QUANTITATIVE: D-Dimer, Quant: 1.07 ug/mL-FEU — ABNORMAL HIGH (ref 0.00–0.50)

## 2019-02-07 LAB — C-REACTIVE PROTEIN: CRP: 8.9 mg/dL — ABNORMAL HIGH (ref <1.0)

## 2019-02-07 LAB — FERRITIN: Ferritin: 743 ng/mL — ABNORMAL HIGH (ref 11–307)

## 2019-02-07 MED ORDER — SODIUM CHLORIDE 0.9 % IV SOLN
100.0000 mg | Freq: Every day | INTRAVENOUS | Status: AC
  Administered 2019-02-08 – 2019-02-11 (×4): 100 mg via INTRAVENOUS
  Filled 2019-02-07 (×2): qty 100
  Filled 2019-02-07: qty 20
  Filled 2019-02-07 (×2): qty 100

## 2019-02-07 MED ORDER — SODIUM CHLORIDE 0.9 % IV SOLN
200.0000 mg | Freq: Once | INTRAVENOUS | Status: AC
  Administered 2019-02-07: 200 mg via INTRAVENOUS
  Filled 2019-02-07: qty 200

## 2019-02-07 MED ORDER — DEXAMETHASONE SODIUM PHOSPHATE 10 MG/ML IJ SOLN
6.0000 mg | Freq: Every day | INTRAMUSCULAR | Status: AC
  Administered 2019-02-07 – 2019-02-15 (×9): 6 mg via INTRAVENOUS
  Filled 2019-02-07 (×9): qty 1

## 2019-02-07 MED ORDER — SODIUM CHLORIDE 0.9 % IV SOLN
100.0000 mg | Freq: Every day | INTRAVENOUS | Status: DC
  Filled 2019-02-07: qty 20

## 2019-02-07 NOTE — Progress Notes (Signed)
Nurse tech attempted to feed pt and she bite down on the fork and broke it off and spit food and fork out. Refusing to eat or drink at the present. Has been very uncooperative and cussing staff. Eulas Post, RN

## 2019-02-07 NOTE — Progress Notes (Addendum)
PROGRESS NOTE    Taylor Vaughn  [id number] DOB: 1929-05-17 DOA: 01/31/2019 PCP: Lujean Amel, MD   Brief Narrative: 85 year old female with hypertension, memory issues on Aricept, amlodipine feeling weak for last 2 to 3 days with decreasing activity and becoming more bedbound.  Over the last 2 days PTA having nausea vomiting and diarrhea.  Patient was brought from independent living facility to ER with constellation of symptoms, in the ER work-up showed COVID-19 was positive level creatinine 0.7 LFTs and hemoglobin stable chest x-ray was unremarkable CT head unremarkable patient was admitted for COVID-19 infection with progressive weakness.  Subjective: Seen and examined this morning, nursing at the bedside on the phone with the daughter as patient has been noncompliant today refusing to eat and was screaming "leave me alone". No acute events overnight. Some dry cough  Assessment & Plan:   COVID-19 virus infection with nausea vomiting diarrhea and generalized weakness: No pulmonary symptoms.On admission troponin is stable 12-13,ferritin was slightyly up at 367 (Nl <307), CRP less than <0.8, D-dimer 0.55 (nl 0.50), and with no leukocytosis, afebrile. repeated CXR 12/2-no infiltrates and pateint was saturating well on RA and on supportive care. Remdesivir stopped after 1st dose due to no iv access as pt was refusing it and since was asymptomatic it was felt to be no needed, and she had been saturating well on room air without GI or pulmonary symptoms or fever.  Patient has been very challenging and difficult to manage as she gets agitated, verbally and also physically thrashes on the bed, screams at times and refused care.  Nursing has been calling patient's family/daughter from room phone to calm her down. Temp 100.2 12/5 afternoon. Today afternoon SPo2 86- 89% [id number] on 2l [place] and at saturating 96%, low grade temp 12/5-check CXR, labs.Covid pcr ordered this am- I discussed w Dr Baxter Flattery  from ID 12/5 and ordered covid-family also requested retest, on admission rapid covid ag +.  CXR showing new patchy perihilar infiltrates,and now needing o2: I again discussed with Dr Baxter Flattery 12/6- recommends to start remdesivir and decadron,pharmacy consulted,checking  pro-calcitonin-if high will consider antibiotics, wbc is up at 14.8k, D-dimer at 1.07, f/u serial labs.  Memory issues/dementia:She is alert awake, uncooperative and agitated today. Not following commands as she is refusing. Check UA. She has been very challenging to manage as she is refusing care.Daughter is frustrated and unable to talk much on phone and very tearful- now on RN contacting SON per her request if any difficulties with patient.   Hypertension:BP well controlled after increasing amlodipine, continue ON 5 mg.  Monitor  SIRS ? On admission:Afebrile no leukocytosis, blood cx neg, do not suspect infection.  Mild Hypokalemia:Resolved.  Arrhythmia/w dropped P wavefirst degree AV block: Cardioloy saw the patient-no further inpatient work-up needed as no evidence of high degree AV block.  Debility/physical deconditioning:Continue PT OT and patient continue to have generalized weakness. Looking into skilled nursing facility placement.Encourage PT/OT,asked PT OT to call daughter from the room if patient is noncompliant with therapy.  Hypoalbuminemia- will get dietitian consult for nutrition eval and augmentation. Pt not eating well. ' Obese with Body mass index is 29.78 kg/m.   GOC; DNR. Will get palliative care consult.  DVT prophylaxis:Lovenox. Code Status:DNR. Family Communication: I had called patient's son and daughter and have discussed the care plan and aware about awaiting SNF son name/no:Bill Bartholomae [phone]. I  Again called her daughter 12/4. She reports she is working on ALF/SNF placement-but is quarantined for covid exposure  and she was tested on Friday- and caregiver was tested on last  Thursday.Daughter does not want the patient to go to [facility] from whrere she had bed offer. Updated daughter 12/6 Also called son and updated-he understands that pt has been very very challenging and difficult to manage. RN to call him if pt refuses treatment.  Disposition Plan:remains inpatient. Will need placement , unsafe safe discharge home Consultants:Dr Graylon Good over the phone. Procedures:None. Microbiology:Blood cx neg so far, covid 19 ag rapid + 11/29.  Antimicrobials: Anti-infectives (From admission, onward)   Start     Dose/Rate Route Frequency Ordered Stop   02/02/19 1000  remdesivir 100 mg in sodium chloride 0.9 % 250 mL IVPB  Status:  Discontinued     100 mg 500 mL/hr over 30 Minutes Intravenous Every 24 hours 02/01/19 0258 02/03/19 1713   02/01/19 0230  remdesivir 200 mg in sodium chloride 0.9 % 250 mL IVPB     200 mg 500 mL/hr over 30 Minutes Intravenous Once 02/01/19 0151 02/01/19 0935       Objective: Vitals:   02/06/19 1400 02/06/19 2018 02/07/19 0621 02/07/19 1046  BP:  137/66 (!) 148/71 137/79  Pulse:  68 69 79  Resp:  (!) 21 18 18   Temp: 99.5 F (37.5 C) 98.1 F (36.7 C) 99.5 F (37.5 C) 97.6 F (36.4 C)  TempSrc: Oral Oral Oral Oral  SpO2:  93% 94% (!) 86%  Weight:      Height:        Intake/Output Summary (Last 24 hours) at 02/07/2019 1456 Last data filed at 02/07/2019 1053 Gross per 24 hour  Intake 240 ml  Output 425 ml  Net -185 ml   Filed Weights   01/31/19 2051 02/01/19 0300  Weight: 81.6 kg 83.7 kg   Weight change:   Body mass index is 29.78 kg/m.  Intake/Output from previous day: 12/05 0701 - 12/06 0700 In: 300 [P.O.:300] Out: 425 [Urine:425] Intake/Output this shift: Total I/O In: 120 [P.O.:120] Out: -   Examination: General exam: Alert awake oriented to self, agitated, on room air this afternoon- placed on 2l [place], Obese.  HEENT:Oral mucosa moist,Ear/Nose WNL grossly, dentition normal. Respiratory system:Bilaterally  diminished BS, coughing at times. Cardiovascular system:S1 & S2 +, No JVD. Gastrointestinal system:Abdomen soft, NT,ND, BS+ Nervous System:Alert,awake, oriented to self, moving extremities. Extremities:No edema,distal peripheral pulses palpable.  Skin:No rashes,no icterus. [id number] muscle bulk,tone, power.  Medications:  Scheduled Meds: . amLODipine  5 mg Oral Daily  . donepezil  5 mg Oral QHS  . enoxaparin (LOVENOX) injection  40 mg Subcutaneous Daily   Continuous Infusions:   Data Reviewed: I have personally reviewed following labs and imaging studies  CBC: Recent Labs  Lab 01/31/19 2056 02/01/19 0324  WBC 6.3 6.1  NEUTROABS 5.0 4.4  HGB 15.7* 14.7  HCT 47.9* 45.3  MCV 95.8 98.3  PLT 263 [id number]   Basic Metabolic Panel: Recent Labs  Lab 01/31/19 2056 02/01/19 0324 02/03/19 0419  NA 139 139 138  K 3.6 3.6 3.7  CL 106 104 106  CO2 24 26 24   GLUCOSE 131* 128* 157*  BUN 19 20 21   CREATININE 0.72 0.78 0.71  CALCIUM 9.5 9.2 9.2   GFR: Estimated Creatinine Clearance: 54 mL/min (by C-G formula based on SCr of 0.71 mg/dL). Liver Function Tests: Recent Labs  Lab 01/31/19 2056 02/01/19 0324 02/03/19 0419  AST 29 24 19   ALT 34 30 26  ALKPHOS 59 52 47  BILITOT 0.8 0.4 0.4  PROT 6.9 6.0* 5.6*  ALBUMIN 3.7 3.1* 2.8*   No results for input(s): LIPASE, AMYLASE in the last 168 hours. No results for input(s): AMMONIA in the last 168 hours. Coagulation Profile: No results for input(s): INR, PROTIME in the last 168 hours. Cardiac Enzymes: No results for input(s): CKTOTAL, CKMB, CKMBINDEX, TROPONINI in the last 168 hours. BNP (last 3 results) No results for input(s): PROBNP in the last 8760 hours. HbA1C: No results for input(s): HGBA1C in the last 72 hours. CBG: No results for input(s): GLUCAP in the last 168 hours. Lipid Profile: No results for input(s): CHOL, HDL, LDLCALC, TRIG, CHOLHDL, LDLDIRECT in the last 72 hours. Thyroid Function Tests: No results for  input(s): TSH, T4TOTAL, FREET4, T3FREE, THYROIDAB in the last 72 hours. Anemia Panel: No results for input(s): VITAMINB12, FOLATE, FERRITIN, TIBC, IRON, RETICCTPCT in the last 72 hours. Sepsis Labs: Recent Labs  Lab 01/31/19 2056 02/01/19 0324  LATICACIDVEN 0.9 1.0    Recent Results (from the past 240 hour(s))  Blood culture (routine x 2)     Status: None   Collection Time: 01/31/19  8:56 PM   Specimen: BLOOD  Result Value Ref Range Status   Specimen Description   Final    BLOOD RIGHT ANTECUBITAL Performed at [facility], [place] [address] [place], [place] [address]    Special Requests   Final    BOTTLES DRAWN AEROBIC AND ANAEROBIC Blood Culture adequate volume Performed at [place] [address], [place], [place] [address]    Culture   Final    NO GROWTH 5 DAYS Performed at [facility], [place] [address] [place], [place] [address]    Report Status 02/06/2019 FINAL  Final  Blood culture (routine x 2)     Status: None   Collection Time: 01/31/19  8:57 PM   Specimen: BLOOD  Result Value Ref Range Status   Specimen Description   Final    BLOOD LEFT ASSIST CONTROL Performed at [facility] [address], [place], [place] [address]    Special Requests   Final    BOTTLES DRAWN AEROBIC AND ANAEROBIC Blood Culture adequate volume Performed at [place] [address], [place], [place] [address]    Culture   Final    NO GROWTH 5 DAYS Performed at [facility], [place] [address] [place], [place] [address]    Report Status 02/06/2019 FINAL  Final  MRSA PCR Screening     Status: None   Collection Time: 02/01/19  4:21 AM   Specimen: Nasopharyngeal  Result Value Ref Range Status   MRSA by PCR NEGATIVE NEGATIVE Final    Comment:        The GeneXpert MRSA Assay (FDA approved for NASAL specimens only), is one component of a comprehensive MRSA colonization surveillance program. It is not intended  to diagnose MRSA infection nor to guide or monitor treatment for MRSA infections. Performed at [facility], [place] [address] [place],  [address]       Radiology Studies: No results found.    LOS: 7 days   Time spent: More than 50% of that time was spent in counseling and/or coordination of care.  Antonieta Pert, MD [facility]  02/07/2019, 2:56 PM

## 2019-02-08 LAB — COMPREHENSIVE METABOLIC PANEL
ALT: 33 U/L (ref 0–44)
AST: 18 U/L (ref 15–41)
Albumin: 2.8 g/dL — ABNORMAL LOW (ref 3.5–5.0)
Alkaline Phosphatase: 48 U/L (ref 38–126)
Anion gap: 10 (ref 5–15)
BUN: 25 mg/dL — ABNORMAL HIGH (ref 8–23)
CO2: 26 mmol/L (ref 22–32)
Calcium: 9.6 mg/dL (ref 8.9–10.3)
Chloride: 102 mmol/L (ref 98–111)
Creatinine, Ser: 1.06 mg/dL — ABNORMAL HIGH (ref 0.44–1.00)
GFR calc Af Amer: 55 mL/min — ABNORMAL LOW (ref >60)
GFR calc non Af Amer: 47 mL/min — ABNORMAL LOW (ref >60)
Glucose, Bld: 181 mg/dL — ABNORMAL HIGH (ref 70–99)
Potassium: 4.3 mmol/L (ref 3.5–5.1)
Sodium: 138 mmol/L (ref 135–145)
Total Bilirubin: 0.4 mg/dL (ref 0.3–1.2)
Total Protein: 6.4 g/dL — ABNORMAL LOW (ref 6.5–8.1)

## 2019-02-08 LAB — CBC
HCT: 48.9 % — ABNORMAL HIGH (ref 36.0–46.0)
Hemoglobin: 15.6 g/dL — ABNORMAL HIGH (ref 12.0–15.0)
MCH: 31 pg (ref 26.0–34.0)
MCHC: 31.9 g/dL (ref 30.0–36.0)
MCV: 97.2 fL (ref 80.0–100.0)
Platelets: 294 10*3/uL (ref 150–400)
RBC: 5.03 MIL/uL (ref 3.87–5.11)
RDW: 13.7 % (ref 11.5–15.5)
WBC: 11.8 10*3/uL — ABNORMAL HIGH (ref 4.0–10.5)
nRBC: 0 % (ref 0.0–0.2)

## 2019-02-08 LAB — D-DIMER, QUANTITATIVE: D-Dimer, Quant: 1.02 ug/mL-FEU — ABNORMAL HIGH (ref 0.00–0.50)

## 2019-02-08 LAB — PROCALCITONIN: Procalcitonin: <0.1 ng/mL

## 2019-02-08 LAB — FERRITIN: Ferritin: 1112 ng/mL — ABNORMAL HIGH (ref 11–307)

## 2019-02-08 LAB — C-REACTIVE PROTEIN: CRP: 8.5 mg/dL — ABNORMAL HIGH (ref <1.0)

## 2019-02-08 LAB — SARS CORONAVIRUS 2 (TAT 6-24 HRS): SARS Coronavirus 2: POSITIVE — AB

## 2019-02-08 MED ORDER — SODIUM CHLORIDE 0.9 % IV SOLN
INTRAVENOUS | Status: AC
  Administered 2019-02-08 – 2019-02-09 (×2): via INTRAVENOUS

## 2019-02-08 NOTE — Care Management Important Message (Signed)
Important Message  Patient Details IM Letter given to Cookie McGibboney RN to present to the Patient Name: Taylor Vaughn MRN: [id number] Date of Birth: [birth year]   Medicare Important Message Given:  Yes     Kerin Salen 02/08/2019, 12:15 PM

## 2019-02-08 NOTE — Progress Notes (Signed)
PROGRESS NOTE    Taylor Vaughn  [id number] DOB: [birth year] DOA: 01/31/2019 PCP: Lujean Amel, MD   Brief Narrative: 85 year old female with hypertension, memory issues on Aricept, amlodipine feeling weak for last 2 to 3 days with decreasing activity and becoming more bedbound.  Over the last 2 days PTA having nausea vomiting and diarrhea.  Patient was brought from independent living facility to ER with constellation of symptoms, in the ER work-up showed COVID-19 was positive level creatinine 0.7 LFTs and hemoglobin stable chest x-ray was unremarkable CT head unremarkable patient was admitted for COVID-19 infection with progressive weakness.  Subjective: Patient seen this morning  "Why did wake me up from sleep, you stupid" "I am thirsty"-I showed her water bottle and gave with to drink she said"Thank you" She let me do a physical exam after that. She has been intermittently agitated, has been challenging to take care of her. Low-grade fever 100.8.  Has not been putting on her oxygen, this morning 93% room air  Assessment & Plan:   COVID-19 virus infection with nausea vomiting diarrhea and generalized weakness initially, now with  COVID Pneumonia: Initially no pulmonary symptoms. On admissionferritin was slightyly up at 367 (Nl <307), CRP less than <0.8, D-dimer 0.55 (nl 0.50), and with no leukocytosis, afebrile.CXR 12/2-no infiltrates -saturating well on RA.  Remdesivir discontinued after patient refused IV line/medication and also was asymptomatic initially (was discussed w/ Dr Manuella Ghazi)- and later discussed w ID). She has been very challenging and difficult to manage as she gets agitated, verbally and also physically thrashes on the bed, screams at times and refuses care.  On 02/07/19 afternoon SPo2 86- 89% [id number] on 2l [place] and at saturating 96%, CXR,CXR showed new patchy perihilar infiltrates. Started on remdesivir/steroid after discussing and consulting with ID Dr Baxter Flattery, procal neg.  Inflammatory markers as below.COIVD2NAA + 12/6.  She had leukocytosis which is downtrending.  Procalcitonin remains negative. COVID-19 Labs  Recent Labs    02/07/19 1554 02/08/19 0332  DDIMER 1.07* 1.02*  FERRITIN 743* 1,112*  CRP 8.9* 8.5*   Memory issues/dementia: Patient has been intermittently agitated , with baseline dementia and refusing care.  She has been very challenging to manage as she refuses care.Daughter is frustrated and unable to talk much on phone and very tearful- now on RN contacting SON and family has been on phone with her so she becomes compliant with the treatment.     Hypertension:BP fairly controlled after increasing amlodipine, continue ON 5 mg.  Monitor  Mild Hypokalemia:Resolved.  Arrhythmia/w dropped P wavefirst degree AV block: Cardioloy saw the patient-no further inpatient work-up needed as no evidence of high degree AV block.  Debility/physical deconditioning:Continue PT OT and patient continue to have generalized weakness. Looking into skilled nursing facility placement.Encourage PT/OT,asked PT OT to call daughter/son from the room if patient is noncompliant with therapy.  Hypoalbuminemia- dietitian consult for nutrition eval and augmentation. Patient has not been eating well.  Poor Oral intake:start on gentle IV hydration x 24 hrs, if patient allows.  Obese with Body mass index is 29.78 kg/m.    GOC; DNR. Requested palliative care consult.  DVT prophylaxis:Lovenox. Code Status:DNR. Family Communication: I had called patient's son and daughter and have discussed the care plan. Son name/no:Bill Bartholomae [phone].  Daughter and son understands that pt has been very very challenging and difficult to manage. RN to call him if pt refuses treatment.  Disposition Plan:remains inpatient for Covid . Will need placement , unsafe safe discharge home Consultants:Dr Graylon Good  over the phone. Procedures:None. Microbiology:Blood cx neg so far, covid 19 ag  rapid + 11/29.  Antimicrobials: Anti-infectives (From admission, onward)   Start     Dose/Rate Route Frequency Ordered Stop   02/08/19 1000  remdesivir 100 mg in sodium chloride 0.9 % 100 mL IVPB     100 mg 200 mL/hr over 30 Minutes Intravenous Daily 02/07/19 1745 02/12/19 0959   02/07/19 1800  remdesivir 100 mg in sodium chloride 0.9 % 100 mL IVPB  Status:  Discontinued     100 mg 200 mL/hr over 30 Minutes Intravenous Daily 02/07/19 1719 02/07/19 1745   02/07/19 1800  remdesivir 200 mg in sodium chloride 0.9% 250 mL IVPB     200 mg 580 mL/hr over 30 Minutes Intravenous Once 02/07/19 1745 02/07/19 1911   02/02/19 1000  remdesivir 100 mg in sodium chloride 0.9 % 250 mL IVPB  Status:  Discontinued     100 mg 500 mL/hr over 30 Minutes Intravenous Every 24 hours 02/01/19 0258 02/03/19 1713   02/01/19 0230  remdesivir 200 mg in sodium chloride 0.9 % 250 mL IVPB     200 mg 500 mL/hr over 30 Minutes Intravenous Once 02/01/19 0151 02/01/19 0935       Objective: Vitals:   02/07/19 2023 02/07/19 2123 02/08/19 0334 02/08/19 1114  BP: (!) 155/67  (!) 165/81   Pulse: 71  62 73  Resp: 20  20   Temp: (!) 100.8 F (38.2 C) 99.7 F (37.6 C) 98.7 F (37.1 C)   TempSrc: Oral Axillary Axillary   SpO2: 96%  93% 93%  Weight:      Height:        Intake/Output Summary (Last 24 hours) at 02/08/2019 1400 Last data filed at 02/08/2019 1000 Gross per 24 hour  Intake 342.58 ml  Output 150 ml  Net 192.58 ml   Filed Weights   01/31/19 2051 02/01/19 0300  Weight: 81.6 kg 83.7 kg   Weight change:   Body mass index is 29.78 kg/m.  Intake/Output from previous day: 12/06 0701 - 12/07 0700 In: 402.6 [P.O.:120; IV Piggyback:282.6] Out: -  Intake/Output this shift: Total I/O In: 60 [P.O.:60] Out: 150 [Urine:150]  Examination: General exam: Alert awake oriented to self, place, appears calm this morning on room air.   HEENT:Oral mucosa DRY,Ear/Nose WNL grossly, dentition normal. Respiratory  system:Bilaterally diminished breath sounds no wheezes, no accessory respiratory muscle use. Cardiovascular system:S1 & S2 +, No JVD. Gastrointestinal system:Abdomen soft, NT,ND, BS+ Nervous System:Alert,awake, oriented to self, moving extremities. Extremities:No edema,distal peripheral pulses palpable.  Skin:No rashes,no icterus. [id number] muscle bulk,tone, power.  Medications:  Scheduled Meds: . amLODipine  5 mg Oral Daily  . dexamethasone (DECADRON) injection  6 mg Intravenous Daily  . donepezil  5 mg Oral QHS  . enoxaparin (LOVENOX) injection  40 mg Subcutaneous Daily   Continuous Infusions: . remdesivir 100 mg in NS 100 mL 100 mg (02/08/19 1119)    Data Reviewed: I have personally reviewed following labs and imaging studies CBC: Recent Labs  Lab 02/07/19 1554 02/08/19 0332  WBC 14.8* 11.8*  HGB 15.3* 15.6*  HCT 46.9* 48.9*  MCV 95.1 97.2  PLT 345 [id number]   Basic Metabolic Panel: Recent Labs  Lab 02/03/19 0419 02/07/19 1554 02/08/19 0332  NA 138 137 138  K 3.7 4.3 4.3  CL 106 101 102  CO2 24 27 26   GLUCOSE 157* 121* 181*  BUN 21 21 25*  CREATININE 0.71 0.88 1.06*  CALCIUM 9.2 9.4  9.6   GFR: Estimated Creatinine Clearance: 40.8 mL/min (A) (by C-G formula based on SCr of 1.06 mg/dL (H)). Liver Function Tests: Recent Labs  Lab 02/03/19 0419 02/07/19 1554 02/08/19 0332  AST 19 21 18   ALT 26 38 33  ALKPHOS 47 54 48  BILITOT 0.4 0.5 0.4  PROT 5.6* 6.4* 6.4*  ALBUMIN 2.8* 2.7* 2.8*   No results for input(s): LIPASE, AMYLASE in the last 168 hours. No results for input(s): AMMONIA in the last 168 hours. Coagulation Profile: No results for input(s): INR, PROTIME in the last 168 hours. Cardiac Enzymes: No results for input(s): CKTOTAL, CKMB, CKMBINDEX, TROPONINI in the last 168 hours. BNP (last 3 results) No results for input(s): PROBNP in the last 8760 hours. HbA1C: No results for input(s): HGBA1C in the last 72 hours. CBG: No results for input(s):  GLUCAP in the last 168 hours. Lipid Profile: No results for input(s): CHOL, HDL, LDLCALC, TRIG, CHOLHDL, LDLDIRECT in the last 72 hours. Thyroid Function Tests: No results for input(s): TSH, T4TOTAL, FREET4, T3FREE, THYROIDAB in the last 72 hours. Anemia Panel: Recent Labs    02/07/19 1554 02/08/19 0332  FERRITIN 743* 1,112*   Sepsis Labs: Recent Labs  Lab 02/07/19 1626 02/08/19 0332  PROCALCITON <0.10 <0.10    Recent Results (from the past 240 hour(s))  Blood culture (routine x 2)     Status: None   Collection Time: 01/31/19  8:56 PM   Specimen: BLOOD  Result Value Ref Range Status   Specimen Description   Final    BLOOD RIGHT ANTECUBITAL Performed at [facility], [place] [address], [place], [place] [address]    Special Requests   Final    BOTTLES DRAWN AEROBIC AND ANAEROBIC Blood Culture adequate volume Performed at [facility] [address] [place], [place] [address]    Culture   Final    NO GROWTH 5 DAYS Performed at [facility], [place] [address], [place], [place] [address]    Report Status 02/06/2019 FINAL  Final  Blood culture (routine x 2)     Status: None   Collection Time: 01/31/19  8:57 PM   Specimen: BLOOD  Result Value Ref Range Status   Specimen Description   Final    BLOOD LEFT ASSIST CONTROL Performed at [facility] [address] [place], [place] [address]    Special Requests   Final    BOTTLES DRAWN AEROBIC AND ANAEROBIC Blood Culture adequate volume Performed at [facility] [address] [place], [place] [address]    Culture   Final    NO GROWTH 5 DAYS Performed at [facility], [place] [address], [place], [place] [address]    Report Status 02/06/2019 FINAL  Final  MRSA PCR Screening     Status: None   Collection Time: 02/01/19  4:21 AM   Specimen: Nasopharyngeal  Result Value Ref Range Status   MRSA by PCR NEGATIVE NEGATIVE Final    Comment:        The  GeneXpert MRSA Assay (FDA approved for NASAL specimens only), is one component of a comprehensive MRSA colonization surveillance program. It is not intended to diagnose MRSA infection nor to guide or monitor treatment for MRSA infections. Performed at [facility], [place] [address], [place], [place] [address]   SARS CORONAVIRUS 2 (TAT 6-24 HRS) Nasopharyngeal Nasopharyngeal Swab     Status: Abnormal   Collection Time: 02/07/19  9:00 AM  Specimen: Nasopharyngeal Swab  Result Value Ref Range Status   SARS Coronavirus 2 POSITIVE (A) NEGATIVE Final    Comment: RESULT CALLED TO, READ BACK BY AND VERIFIED WITH: L. LESLIE,RN [id number] 02/08/2019 T. TYSOR (NOTE) SARS-CoV-2 target nucleic acids are DETECTED. The SARS-CoV-2 RNA is generally detectable in upper and lower respiratory specimens during the acute phase of infection. Positive results are indicative of the presence of SARS-CoV-2 RNA. Clinical correlation with patient history and other diagnostic information is  necessary to determine patient infection status. Positive results do not rule out bacterial infection or co-infection with other viruses.  The expected result is Negative. Fact Sheet for Patients: SugarRoll.be Fact Sheet for Healthcare Providers: https://www.woods-mathews.com/ This test is not yet approved or cleared by the [place] FDA and  has been authorized for detection and/or diagnosis of SARS-CoV-2 by FDA under an Emergency Use Authorization (EUA). This EUA will remain  in effect (meaning this test can be used) for t he duration of the COVID-19 declaration under Section 564(b)(1) of the Act, 21 U.S.C. section 360bbb-3(b)(1), unless the authorization is terminated or revoked sooner. Performed at [facility], [place] [address] [place], [place] [address]     Radiology Studies: Dg Chest Port 1 View  Result Date: 02/07/2019 CLINICAL DATA:  Cough.   Positive COVID-19. EXAM: PORTABLE CHEST 1 VIEW COMPARISON:  02/03/2019 FINDINGS: The heart is borderline enlarged but stable. Stable tortuosity and calcification of the thoracic aorta. New patchy peripheral infiltrates consistent with COVID-19 pneumonia. No pleural effusions. IMPRESSION: New patchy peripheral infiltrates. Electronically Signed   By: Marijo Sanes M.D.   On: 02/07/2019 16:27      LOS: 8 days   Time spent: More than 50% of that time was spent in counseling and/or coordination of care.  Antonieta Pert, MD [facility]  02/08/2019, 2:00 PM

## 2019-02-08 NOTE — Progress Notes (Signed)
Received call from Santiago Glad from [employer]. Pt is positive for Covid. MD notified.

## 2019-02-08 NOTE — Progress Notes (Signed)
Physical Therapy Treatment Patient Details Name: Taylor Vaughn MRN: [id number] DOB: 07-27-1929 Today's Date: 02/08/2019    History of Present Illness Pt is 85 y.o. female with history of hypertension and memory issues on Aricept and amlodipine over the last 2 to 3 days has been feeling more weak than usual.  Pt found to be + for COVID 19 and admitted to hospital.    PT Comments    After having son speak with pt over the phone, pt agreed to work with PT. She required increased assistance for mobility on today. She is weak and fatigues fairly easily. Pt has been resistant and refusing PT/mobility with nursing. Will continue to follow and progress activity as pt will allow. PT continues to recommend SNF. If family declines placement, recommend 24/7 supervision/assist (unsure how much pt will work with [facility])    Follow Up Recommendations  SNF;Supervision/Assistance - 24 hour(HHPT if family chooses for pt to return to Ind Living)     Equipment Recommendations  None recommended by PT    Recommendations for Other Services       Precautions / Restrictions Precautions Precautions: Fall Restrictions Weight Bearing Restrictions: No    Mobility  Bed Mobility Overal bed mobility: Needs Assistance Bed Mobility: Supine to Sit;Sit to Supine     Supine to sit: Mod assist Sit to supine: Mod assist   General bed mobility comments: assist for trunk and bil LEs. increased time. pt initially refused but then she agreed with encouragment and son speaking with her over the phone.  Transfers Overall transfer level: Needs assistance Equipment used: Rolling walker (2 wheeled) Transfers: Sit to/from Stand Sit to Stand: Mod assist         General transfer comment: x2. Assist to rise, stabilize, control descent. Unsteady. Pt fatigues quickly.  Ambulation/Gait Ambulation/Gait assistance: Min assist   Assistive device: Rolling walker (2 wheeled)       General Gait Details: side steps along  the side of the bed. Assist to stabilize pt and RW. Pt unable to and would not agree to walk to bathroom.   Stairs             Wheelchair Mobility    Modified Rankin (Stroke Patients Only)       Balance Overall balance assessment: Needs assistance         Standing balance support: Bilateral upper extremity supported Standing balance-Leahy Scale: Poor                              Cognition Arousal/Alertness: Awake/alert Behavior During Therapy: WFL for tasks assessed/performed Overall Cognitive Status: History of cognitive impairments - at baseline                                        Exercises      General Comments        Pertinent Vitals/Pain Pain Assessment: Faces Faces Pain Scale: Hurts little more Pain Location: feet, R LE Pain Descriptors / Indicators: Sore Pain Intervention(s): Monitored during session;Repositioned    Home Living                      Prior Function            PT Goals (current goals can now be found in the care plan section) Progress towards PT goals: Not progressing toward  goals - comment    Frequency    Min 2X/week      PT Plan Current plan remains appropriate    Co-evaluation              AM-PAC PT "6 Clicks" Mobility   Outcome Measure  Help needed turning from your back to your side while in a flat bed without using bedrails?: A Lot Help needed moving from lying on your back to sitting on the side of a flat bed without using bedrails?: A Lot Help needed moving to and from a bed to a chair (including a wheelchair)?: A Lot Help needed standing up from a chair using your arms (e.g., wheelchair or bedside chair)?: A Lot Help needed to walk in hospital room?: A Lot Help needed climbing 3-5 steps with a railing? : A Lot 6 Click Score: 12    End of Session Equipment Utilized During Treatment: Gait belt Activity Tolerance: Patient limited by fatigue Patient left: in  bed;with call bell/phone within reach;with bed alarm set   PT Visit Diagnosis: Unsteadiness on feet (R26.81);Other abnormalities of gait and mobility (R26.89);Muscle weakness (generalized) (M62.81)     Time: [id number] PT Time Calculation (min) (ACUTE ONLY): 36 min  Charges:  $Therapeutic Activity: 23-37 mins                        Weston Anna, PT Acute Rehabilitation Services Pager: [phone] Office: [phone]

## 2019-02-08 NOTE — Plan of Care (Signed)
  Problem: Education: Goal: Knowledge of General Education information will improve Description: Including pain rating scale, medication(s)/side effects and non-pharmacologic comfort measures Outcome: Progressing   Problem: Health Behavior/Discharge Planning: Goal: Ability to manage health-related needs will improve Outcome: Not Progressing   Problem: Clinical Measurements: Goal: Ability to maintain clinical measurements within normal limits will improve Outcome: Progressing Goal: Will remain free from infection Outcome: Progressing Goal: Diagnostic test results will improve Outcome: Not Progressing Goal: Respiratory complications will improve Outcome: Progressing Goal: Cardiovascular complication will be avoided Outcome: Progressing   Problem: Activity: Goal: Risk for activity intolerance will decrease Outcome: Not Progressing   Problem: Nutrition: Goal: Adequate nutrition will be maintained Outcome: Not Progressing   Problem: Coping: Goal: Level of anxiety will decrease Outcome: Not Progressing   Problem: Safety: Goal: Ability to remain free from injury will improve Outcome: Not Progressing   Problem: Skin Integrity: Goal: Risk for impaired skin integrity will decrease Outcome: Progressing

## 2019-02-09 DIAGNOSIS — Z7189 Other specified counseling: Secondary | ICD-10-CM

## 2019-02-09 DIAGNOSIS — Z515 Encounter for palliative care: Secondary | ICD-10-CM

## 2019-02-09 LAB — CBC
HCT: 46.9 % — ABNORMAL HIGH (ref 36.0–46.0)
Hemoglobin: 15.2 g/dL — ABNORMAL HIGH (ref 12.0–15.0)
MCH: 30.5 pg (ref 26.0–34.0)
MCHC: 32.4 g/dL (ref 30.0–36.0)
MCV: 94 fL (ref 80.0–100.0)
Platelets: 327 10*3/uL (ref 150–400)
RBC: 4.99 MIL/uL (ref 3.87–5.11)
RDW: 13.3 % (ref 11.5–15.5)
WBC: 15.7 10*3/uL — ABNORMAL HIGH (ref 4.0–10.5)
nRBC: 0 % (ref 0.0–0.2)

## 2019-02-09 LAB — COMPREHENSIVE METABOLIC PANEL
ALT: 31 U/L (ref 0–44)
AST: 19 U/L (ref 15–41)
Albumin: 2.7 g/dL — ABNORMAL LOW (ref 3.5–5.0)
Alkaline Phosphatase: 48 U/L (ref 38–126)
Anion gap: 8 (ref 5–15)
BUN: 36 mg/dL — ABNORMAL HIGH (ref 8–23)
CO2: 23 mmol/L (ref 22–32)
Calcium: 9.7 mg/dL (ref 8.9–10.3)
Chloride: 106 mmol/L (ref 98–111)
Creatinine, Ser: 0.77 mg/dL (ref 0.44–1.00)
GFR calc Af Amer: >60 mL/min (ref >60)
GFR calc non Af Amer: >60 mL/min (ref >60)
Glucose, Bld: 176 mg/dL — ABNORMAL HIGH (ref 70–99)
Potassium: 4.3 mmol/L (ref 3.5–5.1)
Sodium: 137 mmol/L (ref 135–145)
Total Bilirubin: 0.6 mg/dL (ref 0.3–1.2)
Total Protein: 6.1 g/dL — ABNORMAL LOW (ref 6.5–8.1)

## 2019-02-09 LAB — PROCALCITONIN: Procalcitonin: <0.1 ng/mL

## 2019-02-09 LAB — FERRITIN: Ferritin: 895 ng/mL — ABNORMAL HIGH (ref 11–307)

## 2019-02-09 LAB — C-REACTIVE PROTEIN: CRP: 4.1 mg/dL — ABNORMAL HIGH (ref <1.0)

## 2019-02-09 LAB — D-DIMER, QUANTITATIVE: D-Dimer, Quant: 2.39 ug/mL-FEU — ABNORMAL HIGH (ref 0.00–0.50)

## 2019-02-09 MED ORDER — SODIUM CHLORIDE 0.9 % IV SOLN
INTRAVENOUS | Status: AC
  Administered 2019-02-09: 21:00:00 via INTRAVENOUS

## 2019-02-09 NOTE — Progress Notes (Signed)
Pt has slept most of the day.  The first 8 hours she would yell for nurse to leave the room and would not take medications or eat.  The last 4 hours she was more responsive, even friendly at times.  Up to [facility] with one assist.  Ate some potatoes, drank orange juice.

## 2019-02-09 NOTE — Progress Notes (Signed)
Pt called nurse names and would not allow assessment. Called son who called Pt back and whiles son was on line reluctantly took meds allowed vitals to be taken but not temperature. Would not allow telemetry leads to be placed right or anything else. Left room.

## 2019-02-09 NOTE — Consult Note (Signed)
Consultation Note Date: 02/09/2019   Patient Name: Taylor Vaughn  DOB: May 31, 1929  MRN: [id number]  Age / Sex: 85 y.o., female  PCP: Lujean Amel, MD Referring Physician: Antonieta Pert, MD  Reason for Consultation: Establishing goals of care  HPI/Patient Profile: 85 y.o. female   admitted on 01/31/2019    85 year old female with hypertension, memory issues on Aricept, amlodipine feeling weak for last 2 to 3 days with decreasing activity and becoming more bedbound.  Over the last 2 days PTA having nausea vomiting and diarrhea.  Patient was brought from independent living facility to ER,in the ER work-up showed COVID-19 was positive.   Clinical Assessment and Goals of Care: Patient has been admitted from her independent living facility with COVID-19 virus infection with nausea vomiting and diarrhea along with generalized weakness.  Additionally found to have Covid pneumonia.  Hospital course complicated by possible dementia with agitation, patient refusing certain treatments, refusing participation with physical therapy.  Also with poor oral intake.  Chart reviewed.  Patient seen earlier today.  Does not engage much.  No family present at bedside.  See additional recommendations as listed below.  NEXT OF KIN  son and daughter.   SUMMARY OF RECOMMENDATIONS    Agree with DNR Agree with recommendation for SNF Recommend palliative care follow up at SNF Continue current mode of care Thank you for the consult.   Code Status/Advance Care Planning:  DNR    Symptom Management:   Continue current mode of care  Palliative Prophylaxis:   Delirium Protocol   Psycho-social/Spiritual:   Desire for further Chaplaincy support:no  Additional Recommendations: Caregiving  Support/Resources  Prognosis:   Unable to determine  Discharge Planning: recomend palliative care follow up at SNF       Primary Diagnoses: Present on Admission: . COVID-19 virus infection . Hypertension . SIRS (systemic inflammatory response syndrome) (HCC)   I have reviewed the medical record, interviewed the patient and family, and examined the patient. The following aspects are pertinent.  Past Medical History:  Diagnosis Date  . Acute chest wall pain 07/22/2011  . Adenomatous colon polyp    3 more polyps on colonoscopy 04/27/14 by [facility]; repeat colonoscopy 1 yr  . Allergic rhinitis 11/22/2011  . Candidal skin infection 10/03/2011  . Cataract 01/11/2013  . Cervical cancer screening 03/19/2012  . Chicken pox as a child  . Depression   . Diverticulosis 05/16/2013  . Grief reaction 10/03/2011  . Hyperlipidemia    statin intolerant  . Hypertension   . Insomnia   . Measles 17  . Medicare annual wellness visit, subsequent 07/22/2011   Follows with Dr Erlene Quan of GI Follow with Dr Sallyanne Kuster of Cardiology   . Memory loss   . MRSA (methicillin resistant staph aureus) culture positive 10/03/2011  . Mumps as a child  . Presbycusis of both ears 12/2013   with tinnitus of both ears (Dr. Erik Obey)  . Preventative health care 07/22/2011  . RAS (renal artery stenosis) ([place])   . Renal artery stenosis ([place])  11/24/2011   bilat, worse 12/2013, but no stenting of renal arteries is indicated as of 03/2014 per Dr. Gwenlyn Found.  Stable on f/u ultrasound 02/2015 per Dr. Kennon Holter f/u.  . Right shoulder strain 10/03/2011  . Tinea corporis 11/15/2012   Under both breasts R>L, left foot.   . Urinary frequency 07/22/2011  . Vertigo   . Whooping cough 85 yrs old   Social History   Socioeconomic History  . Marital status: Widowed    Spouse name: Not on file  . Number of children: 3  . Years of education: College  . Highest education level: Not on file  Occupational History  . Occupation: Retired Producer, television/film/video  Social Needs  . Financial resource strain: Not on file  . Food insecurity    Worry: Not on file     Inability: Not on file  . Transportation needs    Medical: Not on file    Non-medical: Not on file  Tobacco Use  . Smoking status: Former Smoker    Types: Cigarettes  . Smokeless tobacco: Never Used  . Tobacco comment: smoked for 3 years in 30's  Substance and Sexual Activity  . Alcohol use: No  . Drug use: No  . Sexual activity: Never  Lifestyle  . Physical activity    Days per week: Not on file    Minutes per session: Not on file  . Stress: Not on file  Relationships  . Social Herbalist on phone: Not on file    Gets together: Not on file    Attends religious service: Not on file    Active member of club or organization: Not on file    Attends meetings of clubs or organizations: Not on file    Relationship status: Not on file  Other Topics Concern  . Not on file  Social History Narrative   Lives at [facility] (independent living).   Right-handed.   2 cups caffeine per day.   Family History  Problem Relation Age of Onset  . Breast cancer Mother 36       remission, late 99's lung cancer  . Hypertension Mother        ?  . Lung cancer Mother   . Angina Father   . Diabetes type II Father   . Heart disease Father   . Obesity Sister   . Diabetes type II Sister   . Hyperlipidemia Sister   . Heart attack Sister   . Spina bifida Brother   . Constipation Daughter   . Hypertension Daughter   . Hyperlipidemia Daughter   . Obesity Son   . Hypertension Son   . Obesity Maternal Grandmother   . Heart attack Maternal Grandmother   . Stroke Paternal Grandfather    Scheduled Meds: . amLODipine  5 mg Oral Daily  . dexamethasone (DECADRON) injection  6 mg Intravenous Daily  . donepezil  5 mg Oral QHS  . enoxaparin (LOVENOX) injection  40 mg Subcutaneous Daily   Continuous Infusions: . remdesivir 100 mg in NS 100 mL 100 mg (02/09/19 1130)   PRN Meds:.acetaminophen **OR** acetaminophen, benzonatate, hydrALAZINE, ondansetron **OR** ondansetron (ZOFRAN) IV  Medications Prior to Admission:  Prior to Admission medications   Medication Sig Start Date End Date Taking? Authorizing Provider  amLODipine (NORVASC) 2.5 MG tablet Take 2.5 mg by mouth daily. 12/30/18  Yes [provider]  donepezil (ARICEPT) 5 MG tablet Take 5 mg by mouth at bedtime. 12/30/18  Yes [provider]  Fish Oil-Coenzyme Q10 (FISH OIL PLUS CO Q-10) 1000-30 MG CAPS Take 1 tablet by mouth daily.   Yes [provider]  Multiple Vitamins-Minerals (CENTRUM SILVER ADULT 50+ PO) Take 1 tablet by mouth daily.   Yes [provider]   Allergies  Allergen Reactions  . Lisinopril Cough  . Latex     She does not like the feel of Latex (or nail polish, make-up or perfume).  . Statins     Muscle cramps, rashes, loss of hair  . Keflex [Cephalexin] Rash  . Losartan     phlegm   Review of Systems Doesn't engage much  Physical Exam Frail lady resting in bed No distress Regular work of breathing No edema Abdomen not distended  Vital Signs: BP (!) 173/92 (BP Location: Right Arm)   Pulse 76   Temp 97.6 F (36.4 C) (Oral)   Resp 16   Ht 5\' 6"  (1.676 m)   Wt 83.7 kg   SpO2 92%   BMI 29.78 kg/m  Pain Scale: 0-10   Pain Score: 0-No pain   SpO2: SpO2: 92 % O2 Device:SpO2: 92 % O2 Flow Rate: .O2 Flow Rate (L/min): 0 L/min  IO: Intake/output summary:   Intake/Output Summary (Last 24 hours) at 02/09/2019 1558 Last data filed at 02/09/2019 1531 Gross per 24 hour  Intake 1481.9 ml  Output 450 ml  Net 1031.9 ml    LBM: Last BM Date: 02/07/19 Baseline Weight: Weight: 81.6 kg Most recent weight: Weight: 83.7 kg     Palliative Assessment/Data:   PPS 40%  Time In:  1500 Time Out:  1600 Time Total:  60  Greater than 50%  of this time was spent counseling and coordinating care related to the above assessment and plan.  Signed by: Loistine Chance, MD   Please contact Palliative Medicine Team phone at [phone] for questions and concerns.   For individual provider: See Shea Evans

## 2019-02-09 NOTE — Progress Notes (Addendum)
PROGRESS NOTE    Taylor Vaughn  [id number] DOB: 1929-11-08 DOA: 01/31/2019 PCP: Lujean Amel, MD   Brief Narrative: 85 year old female with hypertension, memory issues on Aricept, amlodipine feeling weak for last 2 to 3 days with decreasing activity and becoming more bedbound.  Over the last 2 days PTA having nausea vomiting and diarrhea.  Patient was brought from independent living facility to ER with constellation of symptoms, in the ER work-up showed COVID-19 was positive level creatinine 0.7 LFTs and hemoglobin stable chest x-ray was unremarkable CT head unremarkable patient was admitted for COVID-19 infection with progressive weakness.  Subjective: Patient seen this morning. " Leave me alone" Discussed w/ RN this afternoon and patient was very pleasant and actually got up to the bedside with nursing staff. Afebrile overnight.  This morning was saturating 96% on room air  Assessment & Plan:   COVID-19 virus infection with nausea vomiting diarrhea and generalized weakness initially, now with  COVID Pneumonia: Initially no pulmonary symptoms.On admission ferritin was slightyly up at 367 (Nl <307), CRP less than <0.8, D-dimer 0.55 (nl 0.50), and with no leukocytosis, afebrile.CXR 12/2-no infiltrates-saturating well on RA.  Remdesivir discontinued after patient refused IV line/medication and also was asymptomatic initially (was discussed w/ Dr Manuella Ghazi)- and later discussed w ID). She has been very challenging and difficult to manage as she gets agitated, verbally and also physically thrashes on the bed, screams at times and refuses care.On 02/07/19 afternoon SPo2 86- 89% [id number] on 2l [place] and at saturating 96%,CXR showed new patchy perihilar infiltrates.Started on remdesivir/steroid after discussing and consulting with ID Dr Baxter Flattery, procal neg.Inflammatory markers as below and mostly downtrending CRP/ferritin. COVID 2 NAA + on Likely from steroid.Monitor inflammatory markers as below.  COVID-19 Labs. Recent Labs    02/07/19 1554 02/08/19 0332 02/09/19 0406  DDIMER 1.07* 1.02* 2.39*  FERRITIN 743* 1,112* 895*  CRP 8.9* 8.5* 4.1*   Memory issues/dementia:Patient has been intermittently agitated , with baseline dementia and refusing care.  She has been very challenging to manage as she refuses care.Daughter is frustrated and unable to talk much on phone and very tearful-now on RN contacting SON and family has been on phone with her so she becomes compliant with the treatment.     Hypertension:BP fairly controlled after increasing amlodipine, continue  5 mg.  Monitor blood pressure.  Leucocytosis: WBC up, no fever. procal reassuring <0.1, ?from steroid. monitor  Mild Hypokalemia:Resolved.  Arrhythmia/w dropped P wavefirst degree AV block: Cardioloy saw the patient-no further inpatient work-up needed as no evidence of high degree AV block.  If patient has recurrent issue can consider switching her Aricept.  Debility/physical deconditioning/Poor Oral intake/failure to thrive/debility generalized weakness:Patient was able to work with PT OT yesterday and today went to the bedside with nursing and appeared very weak.She will continue to need skilled nursing facility.PT OT to call daughter/son from the room if patient is noncompliant with therapy.: again add gentle iv 50 ml/hr x 12 hrs.Encourage oral intake.  Monitor BUN/creatinine.  Hypoalbuminemia-dietitian consult for nutrition eval and augmentation.Patient has not been eating well.  Obese with Body mass index is 29.78 kg/m.    GOC:DNR.Requested palliative care consult-palliative care.  DVT prophylaxis:Lovenox. Code Status:DNR. Family Communication:I had called patient's son and daughter and have discussed the care plan. Son name/no:Bill Bartholomae [phone].  Daughter and son understands that pt has been very very challenging and difficult to manage.Care Team to call son if pt refuses treatment. Called and updated  daughter today-she is COVID neg,she  said she is looking into assisted living facility "I doubt they will take her". She says I do not want her to die alone".She is tearful taking about her mother. I counseled her as well. She said she has talked with Education officer, museum today.Will wait for family decision for further placement.  Disposition Plan:Remains inpatient for Covid treatment. Cont to wean oxygen-comes off [place] and saturating well actually 96% RA this am. Will need a skilled nursing facility placement based on PT/OT needs.  RN/care team to call patient's son or daughter from the room if patient is not eating or not taking her medication or not being compliant with therapy or any treatment, so that they can convince the patient Discussed with nursing staff again today to have PT work with the patient on regular basis while PT is not working nursing to work with the patient for ambulation.  Consultants:Dr Snider over the phone. Procedures:None. Microbiology:Blood cx neg so far, covid 19 ag rapid + 11/29. COVID NAA positive on 12/6  Antimicrobials: Anti-infectives (From admission, onward)   Start     Dose/Rate Route Frequency Ordered Stop   02/08/19 1000  remdesivir 100 mg in sodium chloride 0.9 % 100 mL IVPB     100 mg 200 mL/hr over 30 Minutes Intravenous Daily 02/07/19 1745 02/12/19 0959   02/07/19 1800  remdesivir 100 mg in sodium chloride 0.9 % 100 mL IVPB  Status:  Discontinued     100 mg 200 mL/hr over 30 Minutes Intravenous Daily 02/07/19 1719 02/07/19 1745   02/07/19 1800  remdesivir 200 mg in sodium chloride 0.9% 250 mL IVPB     200 mg 580 mL/hr over 30 Minutes Intravenous Once 02/07/19 1745 02/07/19 1911   02/02/19 1000  remdesivir 100 mg in sodium chloride 0.9 % 250 mL IVPB  Status:  Discontinued     100 mg 500 mL/hr over 30 Minutes Intravenous Every 24 hours 02/01/19 0258 02/03/19 1713   02/01/19 0230  remdesivir 200 mg in sodium chloride 0.9 % 250 mL IVPB     200 mg 500 mL/hr  over 30 Minutes Intravenous Once 02/01/19 0151 02/01/19 0935     Objective: Vitals:   02/08/19 1414 02/08/19 1426 02/08/19 2015 02/09/19 1533  BP: (!) 154/76  (!) 148/90 (!) 173/92  Pulse: 73  84 76  Resp: (!) 26 (!) 22 18 16   Temp: (!) 97.4 F (36.3 C)  98.3 F (36.8 C) 97.6 F (36.4 C)  TempSrc: Oral  Oral Oral  SpO2: 91%  96% 92%  Weight:      Height:        Intake/Output Summary (Last 24 hours) at 02/09/2019 1714 Last data filed at 02/09/2019 1531 Gross per 24 hour  Intake 1481.9 ml  Output 450 ml  Net 1031.9 ml   Filed Weights   01/31/19 2051 02/01/19 0300  Weight: 81.6 kg 83.7 kg   Weight change:   Body mass index is 29.78 kg/m.  Intake/Output from previous day: 12/07 0701 - 12/08 0700 In: 1701.9 [P.O.:600; I.V.:1001.9; IV Piggyback:100] Out: 650 [Urine:650] Intake/Output this shift: Total I/O In: 0  Out: 50 [Urine:50]  Examination:  General exam: Alert awake oriented to place and self, agitated. Not answering all questions. Obese.  HEENT:Oral mucosa DRY,Ear/Nose WNL grossly, dentition normal. Respiratory system: Bilateral diminished breath, non tender, no wheezes, no accessory respiratory muscle use. Cardiovascular system:S1 & S2 +, No JVD. Gastrointestinal system:Abdomen soft,Obese, NT/ND/BS+. Nervous System:Alert,awake,oriented to self,moving extremities.Limited exam as patient is noncompliant. Extremities:No edema,distal peripheral  pulses palpable.  Skin:No rashes,no icterus. [id number] muscle bulk,tone, power.  Medications: Scheduled Meds: . amLODipine  5 mg Oral Daily  . dexamethasone (DECADRON) injection  6 mg Intravenous Daily  . donepezil  5 mg Oral QHS  . enoxaparin (LOVENOX) injection  40 mg Subcutaneous Daily   Continuous Infusions: . sodium chloride    . remdesivir 100 mg in NS 100 mL 100 mg (02/09/19 1130)    Data Reviewed: I have personally reviewed following labs and imaging studies CBC: Recent Labs  Lab 02/07/19 1554 02/08/19  0332 02/09/19 0406  WBC 14.8* 11.8* 15.7*  HGB 15.3* 15.6* 15.2*  HCT 46.9* 48.9* 46.9*  MCV 95.1 97.2 94.0  PLT 345 294 [id number]   Basic Metabolic Panel: Recent Labs  Lab 02/03/19 0419 02/07/19 1554 02/08/19 0332 02/09/19 0406  NA 138 137 138 137  K 3.7 4.3 4.3 4.3  CL 106 101 102 106  CO2 24 27 26 23   GLUCOSE 157* 121* 181* 176*  BUN 21 21 25* 36*  CREATININE 0.71 0.88 1.06* 0.77  CALCIUM 9.2 9.4 9.6 9.7   GFR: Estimated Creatinine Clearance: 54 mL/min (by C-G formula based on SCr of 0.77 mg/dL). Liver Function Tests: Recent Labs  Lab 02/03/19 0419 02/07/19 1554 02/08/19 0332 02/09/19 0406  AST 19 21 18 19   ALT 26 38 33 31  ALKPHOS 47 54 48 48  BILITOT 0.4 0.5 0.4 0.6  PROT 5.6* 6.4* 6.4* 6.1*  ALBUMIN 2.8* 2.7* 2.8* 2.7*   No results for input(s): LIPASE, AMYLASE in the last 168 hours. No results for input(s): AMMONIA in the last 168 hours. Coagulation Profile: No results for input(s): INR, PROTIME in the last 168 hours. Cardiac Enzymes: No results for input(s): CKTOTAL, CKMB, CKMBINDEX, TROPONINI in the last 168 hours. BNP (last 3 results) No results for input(s): PROBNP in the last 8760 hours. HbA1C: No results for input(s): HGBA1C in the last 72 hours. CBG: No results for input(s): GLUCAP in the last 168 hours. Lipid Profile: No results for input(s): CHOL, HDL, LDLCALC, TRIG, CHOLHDL, LDLDIRECT in the last 72 hours. Thyroid Function Tests: No results for input(s): TSH, T4TOTAL, FREET4, T3FREE, THYROIDAB in the last 72 hours. Anemia Panel: Recent Labs    02/08/19 0332 02/09/19 0406  FERRITIN 1,112* 895*   Sepsis Labs: Recent Labs  Lab 02/07/19 1626 02/08/19 0332 02/09/19 0406  PROCALCITON <0.10 <0.10 <0.10    Recent Results (from the past 240 hour(s))  Blood culture (routine x 2)     Status: None   Collection Time: 01/31/19  8:56 PM   Specimen: BLOOD  Result Value Ref Range Status   Specimen Description   Final    BLOOD RIGHT ANTECUBITAL  Performed at [facility], [place] [address] [place], [place] [address]    Special Requests   Final    BOTTLES DRAWN AEROBIC AND ANAEROBIC Blood Culture adequate volume Performed at [place] [address], [place], [place] [address]    Culture   Final    NO GROWTH 5 DAYS Performed at [facility], [place] [address], [place], [place] [address]    Report Status 02/06/2019 FINAL  Final  Blood culture (routine x 2)     Status: None   Collection Time: 01/31/19  8:57 PM   Specimen: BLOOD  Result Value Ref Range Status   Specimen Description   Final    BLOOD LEFT ASSIST CONTROL Performed at [facility] [address]  [place], [place] [address]    Special Requests   Final    BOTTLES DRAWN AEROBIC AND ANAEROBIC Blood Culture adequate volume Performed at [place] [address], [place], [place] [address]    Culture   Final    NO GROWTH 5 DAYS Performed at [facility], [place] [address], [place], [place] [address]    Report Status 02/06/2019 FINAL  Final  MRSA PCR Screening     Status: None   Collection Time: 02/01/19  4:21 AM   Specimen: Nasopharyngeal  Result Value Ref Range Status   MRSA by PCR NEGATIVE NEGATIVE Final    Comment:        The GeneXpert MRSA Assay (FDA approved for NASAL specimens only), is one component of a comprehensive MRSA colonization surveillance program. It is not intended to diagnose MRSA infection nor to guide or monitor treatment for MRSA infections. Performed at [facility], [place] [address] [place], [place] [address]   SARS CORONAVIRUS 2 (TAT 6-24 HRS) Nasopharyngeal Nasopharyngeal Swab     Status: Abnormal   Collection Time: 02/07/19  9:00 AM   Specimen: Nasopharyngeal Swab  Result Value Ref Range Status   SARS Coronavirus 2 POSITIVE (A) NEGATIVE Final    Comment: RESULT CALLED TO, READ BACK BY AND VERIFIED WITH: L. LESLIE,RN 0420 02/08/2019 T.  TYSOR (NOTE) SARS-CoV-2 target nucleic acids are DETECTED. The SARS-CoV-2 RNA is generally detectable in upper and lower respiratory specimens during the acute phase of infection. Positive results are indicative of the presence of SARS-CoV-2 RNA. Clinical correlation with patient history and other diagnostic information is  necessary to determine patient infection status. Positive results do not rule out bacterial infection or co-infection with other viruses.  The expected result is Negative. Fact Sheet for Patients: SugarRoll.be Fact Sheet for Healthcare Providers: https://www.woods-mathews.com/ This test is not yet approved or cleared by the [place] FDA and  has been authorized for detection and/or diagnosis of SARS-CoV-2 by FDA under an Emergency Use Authorization (EUA). This EUA will remain  in effect (meaning this test can be used) for t he duration of the COVID-19 declaration under Section 564(b)(1) of the Act, 21 U.S.C. section 360bbb-3(b)(1), unless the authorization is terminated or revoked sooner. Performed at [facility], [place] [address], [place],  [address]     Radiology Studies: No results found.    LOS: 9 days   Time spent: More than 50% of that time was spent in counseling and/or coordination of care.  Antonieta Pert, MD [facility]  02/09/2019, 5:14 PM

## 2019-02-09 NOTE — TOC Progression Note (Signed)
Transition of Care [facility]) - Progression Note    Patient Details  Name: Taylor Vaughn MRN: [id number] Date of Birth: [birth year]  [facility] Care [facility]) CM/SW Contact  Purcell Mouton, RN Phone Number: 02/09/2019, 2:09 PM  Clinical Narrative:    Daughter Reola Mosher [phone] called to ask that pt's FL2 be faxed to [facility]memory [facility].        Expected Discharge Plan and Services           Expected Discharge Date: 02/02/19                                     Social Determinants of Health (SDOH) Interventions    Readmission Risk Interventions No flowsheet data found.

## 2019-02-10 DIAGNOSIS — R531 Weakness: Secondary | ICD-10-CM

## 2019-02-10 DIAGNOSIS — N179 Acute kidney failure, unspecified: Secondary | ICD-10-CM

## 2019-02-10 DIAGNOSIS — R5381 Other malaise: Secondary | ICD-10-CM

## 2019-02-10 DIAGNOSIS — J9601 Acute respiratory failure with hypoxia: Secondary | ICD-10-CM

## 2019-02-10 DIAGNOSIS — R112 Nausea with vomiting, unspecified: Secondary | ICD-10-CM

## 2019-02-10 DIAGNOSIS — U071 COVID-19: Principal | ICD-10-CM

## 2019-02-10 MED ORDER — HYDRALAZINE HCL 25 MG PO TABS
25.0000 mg | ORAL_TABLET | Freq: Four times a day (QID) | ORAL | Status: DC | PRN
  Administered 2019-02-10 – 2019-02-11 (×2): 25 mg via ORAL
  Filled 2019-02-10 (×4): qty 1

## 2019-02-10 MED ORDER — ENSURE ENLIVE PO LIQD
237.0000 mL | Freq: Two times a day (BID) | ORAL | Status: DC
  Administered 2019-02-10 – 2019-02-22 (×19): 237 mL via ORAL

## 2019-02-10 MED ORDER — ADULT MULTIVITAMIN W/MINERALS CH
1.0000 | ORAL_TABLET | Freq: Every day | ORAL | Status: DC
  Administered 2019-02-10 – 2019-02-22 (×10): 1 via ORAL
  Filled 2019-02-10 (×12): qty 1

## 2019-02-10 MED ORDER — AMLODIPINE BESYLATE 10 MG PO TABS
10.0000 mg | ORAL_TABLET | Freq: Every day | ORAL | Status: DC
  Administered 2019-02-11 – 2019-02-22 (×11): 10 mg via ORAL
  Filled 2019-02-10 (×11): qty 1

## 2019-02-10 NOTE — Progress Notes (Addendum)
   02/10/19 1230  MEWS Score  Resp (!) 28  Pulse Rate 66  BP (!) 162/94  Temp 98.2 F (36.8 C)  Level of Consciousness Alert  SpO2 90 %  O2 Device Room Air  MEWS Score  MEWS RR 2  MEWS Pulse 0  MEWS Systolic 0  MEWS LOC 0  MEWS Temp 0  MEWS Score 2  MEWS Score Color Yellow  MEWS Assessment  Is this an acute change? Yes  Provider Notification  Provider Name/Title Cyndia Skeeters  Date Provider Notified 02/10/19  Time Provider Notified 1235  Notification Type Page  Notification Reason Other (Comment) (RR increased, BP elevated)  Pts RR increased to 28. MD notified, will carry out new orders and continue to monitor.

## 2019-02-10 NOTE — Progress Notes (Signed)
PROGRESS NOTE  Taylor Vaughn ZDG:[id number] DOB: 1929-05-06   PCP: Darrow Bussing, MD  Patient is from: Independent living facility  DOA: 01/31/2019 LOS: 10  Brief Narrative / Interim history: 85 year old female with history of HTN, dementia, HLD and radiculopathy presenting with generalized weakness, nausea, vomiting and diarrhea for 2 to 3 days POA.  In ED, positive for COVID-19. CBC, CMP, CXR and CT head without acute finding. Patient was admitted for COVID-19 infection with associated GI symptoms. However, patient desaturated to 86% on RA later in the course requiring 2 L by Benzie to maintain appropriate saturation. He was started on remdesivir and a steroid per ID recommendation.  Subjective: No major events overnight of this morning. Saturating in low 90s on room air.  Patient has advanced dementia.  She states "I want to die".  Initially, she said she has nothing to live for. However, after further discussion about her children and grandchildren, she says " I guess I have something to live for".  She has no complaints.  She denies chest pain, dyspnea, nausea, vomiting or abdominal pain.  Continues to endorse intermittent cough.   Objective: Vitals:   02/10/19 1013 02/10/19 1225 02/10/19 1230 02/10/19 1407  BP: (!) 168/69 (!) 184/84 (!) 162/94 (!) 154/77  Pulse: 63 67 66 70  Resp: 20 (!) 30 (!) 28 (!) 28  Temp:  98.2 F (36.8 C) 98.2 F (36.8 C)   TempSrc:  Oral    SpO2: 94% 90% 90%   Weight:      Height:        Intake/Output Summary (Last 24 hours) at 02/10/2019 1421 Last data filed at 02/10/2019 1413 Gross per 24 hour  Intake 753.88 ml  Output 950 ml  Net -196.12 ml   Filed Weights   01/31/19 2051 02/01/19 0300  Weight: 81.6 kg 83.7 kg    Examination:  GENERAL: No acute distress.  Appears well.  HEENT: MMM.  Vision and hearing grossly intact.  NECK: Supple.  No apparent JVD.  RESP:  No IWOB.  Fair air movement bilaterally. CVS:  RRR. Heart sounds normal.   ABD/GI/GU: Bowel sounds present. Soft. Non tender.  MSK/EXT:  No apparent deformity or edema. Moves extremities. SKIN: no apparent skin lesion or wound NEURO: Awake, alert and oriented fairly.  No gross deficit.  PSYCH: Calm once situated and covered in bed appropriately.  Procedures:  None  Assessment & Plan: Acute respiratory failure with hypoxia due to COVID-19 infection/pneumonia  -Desaturated to 86% on RA requiring 2 L to maintain appropriate saturation.  Now saturating in lower 90s on RA. -Inflammatory markers downtrending.  Pro-Cal negative. -Continue remdesivir and Decadron 12/8>> -Supportive care with bronchodilators, mucolytic's, antitussives and vitamins. Recent Labs    02/07/19 1554 02/08/19 0332 02/09/19 0406  DDIMER 1.07* 1.02* 2.39*  FERRITIN 743* 1,112* 895*  CRP 8.9* 8.5* 4.1*   Nausea/vomiting/diarrhea: Likely due to the above.  Resolved. -Supportive care  Memory issues/dementia: Fairly oriented today. -Frequent reorientation and delirium precautions.  Essential hypertension: BP elevated. -Increase amlodipine to 10 mg -Hydralazine 25 mg every 6 hours PRN SBP above 160 or DBP above 100.  First-degree AV block: Could be due to Aricept.  Evaluated by cardiology.  No further inpatient work-up needed. -Discontinue Aricept if symptomatic.  Leukocytosis: Likely due to steroid. -Continue monitoring  Hypokalemia: Resolved.  AKI: Resolved. -Continue monitoring  Debility/physical deconditioning/FTT/generalized weakness -Still with significant weakness after PT/OT eval.    Nutrition Problem: Increased nutrient needs Etiology: acute illness(COVID-19 positive patient)  Signs/Symptoms: estimated needs  Interventions: MVI, Magic cup, Ensure Enlive (each supplement provides 350kcal and 20 grams of protein)   DVT prophylaxis: Subcu Lovenox Code Status: DNR/DNI Family Communication: Patient and/or RN.  Will update family later. Disposition Plan: Remains  inpatient Consultants: Cardiology (off)   Microbiology summarized: 12/6-COVID-19 positive. 11/29-blood cultures negative. 11/30-MRSA PCR negative.  Sch Meds:  Scheduled Meds: . [START ON 02/11/2019] amLODipine  10 mg Oral Daily  . dexamethasone (DECADRON) injection  6 mg Intravenous Daily  . donepezil  5 mg Oral QHS  . enoxaparin (LOVENOX) injection  40 mg Subcutaneous Daily  . feeding supplement (ENSURE ENLIVE)  237 mL Oral BID BM  . multivitamin with minerals  1 tablet Oral Daily   Continuous Infusions: . remdesivir 100 mg in NS 100 mL 100 mg (02/10/19 1019)   PRN Meds:.acetaminophen **OR** acetaminophen, benzonatate, hydrALAZINE, ondansetron **OR** ondansetron (ZOFRAN) IV  Antimicrobials: Anti-infectives (From admission, onward)   Start     Dose/Rate Route Frequency Ordered Stop   02/08/19 1000  remdesivir 100 mg in sodium chloride 0.9 % 100 mL IVPB     100 mg 200 mL/hr over 30 Minutes Intravenous Daily 02/07/19 1745 02/12/19 0959   02/07/19 1800  remdesivir 100 mg in sodium chloride 0.9 % 100 mL IVPB  Status:  Discontinued     100 mg 200 mL/hr over 30 Minutes Intravenous Daily 02/07/19 1719 02/07/19 1745   02/07/19 1800  remdesivir 200 mg in sodium chloride 0.9% 250 mL IVPB     200 mg 580 mL/hr over 30 Minutes Intravenous Once 02/07/19 1745 02/07/19 1911   02/02/19 1000  remdesivir 100 mg in sodium chloride 0.9 % 250 mL IVPB  Status:  Discontinued     100 mg 500 mL/hr over 30 Minutes Intravenous Every 24 hours 02/01/19 0258 02/03/19 1713   02/01/19 0230  remdesivir 200 mg in sodium chloride 0.9 % 250 mL IVPB     200 mg 500 mL/hr over 30 Minutes Intravenous Once 02/01/19 0151 02/01/19 0935       I have personally reviewed the following labs and images: CBC: Recent Labs  Lab 02/07/19 1554 02/08/19 0332 02/09/19 0406  WBC 14.8* 11.8* 15.7*  HGB 15.3* 15.6* 15.2*  HCT 46.9* 48.9* 46.9*  MCV 95.1 97.2 94.0  PLT 345 294 327   BMP &GFR Recent Labs  Lab  02/07/19 1554 02/08/19 0332 02/09/19 0406  NA 137 138 137  K 4.3 4.3 4.3  CL 101 102 106  CO2 27 26 23   GLUCOSE 121* 181* 176*  BUN 21 25* 36*  CREATININE 0.88 1.06* 0.77  CALCIUM 9.4 9.6 9.7   Estimated Creatinine Clearance: 54 mL/min (by C-G formula based on SCr of 0.77 mg/dL). Liver & Pancreas: Recent Labs  Lab 02/07/19 1554 02/08/19 0332 02/09/19 0406  AST 21 18 19   ALT 38 33 31  ALKPHOS 54 48 48  BILITOT 0.5 0.4 0.6  PROT 6.4* 6.4* 6.1*  ALBUMIN 2.7* 2.8* 2.7*   No results for input(s): LIPASE, AMYLASE in the last 168 hours. No results for input(s): AMMONIA in the last 168 hours. Diabetic: No results for input(s): HGBA1C in the last 72 hours. No results for input(s): GLUCAP in the last 168 hours. Cardiac Enzymes: No results for input(s): CKTOTAL, CKMB, CKMBINDEX, TROPONINI in the last 168 hours. No results for input(s): PROBNP in the last 8760 hours. Coagulation Profile: No results for input(s): INR, PROTIME in the last 168 hours. Thyroid Function Tests: No results for input(s): TSH,  T4TOTAL, FREET4, T3FREE, THYROIDAB in the last 72 hours. Lipid Profile: No results for input(s): CHOL, HDL, LDLCALC, TRIG, CHOLHDL, LDLDIRECT in the last 72 hours. Anemia Panel: Recent Labs    02/08/19 0332 02/09/19 0406  FERRITIN 1,112* 895*   Urine analysis:    Component Value Date/Time   COLORURINE YELLOW 02/02/2019 2043   APPEARANCEUR HAZY (A) 02/02/2019 2043   LABSPEC 1.012 02/02/2019 2043   PHURINE 6.0 02/02/2019 2043   GLUCOSEU NEGATIVE 02/02/2019 2043   GLUCOSEU NEGATIVE 11/15/2013 1106   HGBUR NEGATIVE 02/02/2019 2043   BILIRUBINUR NEGATIVE 02/02/2019 2043   BILIRUBINUR neg 12/31/2011 1355   KETONESUR NEGATIVE 02/02/2019 2043   PROTEINUR NEGATIVE 02/02/2019 2043   UROBILINOGEN 0.2 11/15/2013 1106   NITRITE NEGATIVE 02/02/2019 2043   LEUKOCYTESUR NEGATIVE 02/02/2019 2043   Sepsis Labs: Invalid input(s): PROCALCITONIN, LACTICIDVEN  Microbiology: Recent  Results (from the past 240 hour(s))  Blood culture (routine x 2)     Status: None   Collection Time: 01/31/19  8:56 PM   Specimen: BLOOD  Result Value Ref Range Status   Specimen Description   Final    BLOOD RIGHT ANTECUBITAL Performed at [facility], [address] [place], [place] [address]    Special Requests   Final    BOTTLES DRAWN AEROBIC AND ANAEROBIC Blood Culture adequate volume Performed at [facility], [address], [place], [place] [address]    Culture   Final    NO GROWTH 5 DAYS Performed at [facility], [address], [place], [place] [address]    Report Status 02/06/2019 FINAL  Final  Blood culture (routine x 2)     Status: None   Collection Time: 01/31/19  8:57 PM   Specimen: BLOOD  Result Value Ref Range Status   Specimen Description   Final    BLOOD LEFT ASSIST CONTROL Performed at [facility], [address] [place], [place] [address]    Special Requests   Final    BOTTLES DRAWN AEROBIC AND ANAEROBIC Blood Culture adequate volume Performed at [facility], [address], [place], [place] [address]    Culture   Final    NO GROWTH 5 DAYS Performed at [facility], [address], [place], [place] [address]    Report Status 02/06/2019 FINAL  Final  MRSA PCR Screening     Status: None   Collection Time: 02/01/19  4:21 AM   Specimen: Nasopharyngeal  Result Value Ref Range Status   MRSA by PCR NEGATIVE NEGATIVE Final    Comment:        The GeneXpert MRSA Assay (FDA approved for NASAL specimens only), is one component of a comprehensive MRSA colonization surveillance program. It is not intended to diagnose MRSA infection nor to guide or monitor treatment for MRSA infections. Performed at [facility], [address], [place], [place] [address]   SARS CORONAVIRUS 2 (TAT 6-24 HRS) Nasopharyngeal Nasopharyngeal Swab     Status: Abnormal   Collection  Time: 02/07/19  9:00 AM   Specimen: Nasopharyngeal Swab  Result Value Ref Range Status   SARS Coronavirus 2 POSITIVE (A) NEGATIVE Final    Comment: RESULT CALLED TO, READ BACK BY AND VERIFIED WITH: L. LESLIE,RN 0420 02/08/2019 T. TYSOR (NOTE) SARS-CoV-2 target nucleic acids are DETECTED. The SARS-CoV-2 RNA is generally detectable in upper and lower respiratory specimens during the acute phase of infection. Positive results are indicative of the presence of  SARS-CoV-2 RNA. Clinical correlation with patient history and other diagnostic information is  necessary to determine patient infection status. Positive results do not rule out bacterial infection or co-infection with other viruses.  The expected result is Negative. Fact Sheet for Patients: HairSlick.no Fact Sheet for Healthcare Providers: quierodirigir.com This test is not yet approved or cleared by the [place] FDA and  has been authorized for detection and/or diagnosis of SARS-CoV-2 by FDA under an Emergency Use Authorization (EUA). This EUA will remain  in effect (meaning this test can be used) for t he duration of the COVID-19 declaration under Section 564(b)(1) of the Act, 21 U.S.C. section 360bbb-3(b)(1), unless the authorization is terminated or revoked sooner. Performed at [facility], [address], [place], [place] [address]     Radiology Studies: No results found.  35 minutes with more than 50% spent in reviewing records, counseling patient/family and coordinating care.   Apollonia Amini T. [facility]  If 7PM-7AM, please contact night-coverage [email or web address] Password [email or web address] 02/10/2019, 2:21 PM

## 2019-02-10 NOTE — TOC Progression Note (Addendum)
Transition of Care [facility]) - Progression Note    Patient Details  Name: Taylor Vaughn MRN: [id number] Date of Birth: 08-Jan-1930  Transition of [facility] CM/SW Contact  Purcell Mouton, RN Phone Number: 02/10/2019, 12:50 PM  Clinical Narrative:    A call to pt's daughter [name] was made. [name] asked that I call her back in 30 mins. This CM will try to return call.  Pt's son asked that his number to be placed in chart for staff to call if his mother would not cooperate with them. Alvy Bimler [phone].        Expected Discharge Plan and Services           Expected Discharge Date: 02/02/19                                     Social Determinants of Health (SDOH) Interventions    Readmission Risk Interventions No flowsheet data found.

## 2019-02-10 NOTE — Progress Notes (Signed)
Initial Nutrition Assessment  RD working remotely.   DOCUMENTATION CODES:   Not applicable  INTERVENTION:  - will order Ensure Enlive BID, each supplement provides 350 kcal and 20 grams of protein. - will order Magic Cup with lunch meals, each supplement provides 290 kcal and 9 grams of protein. - will order daily multivitamin with minerals. - will liberalize diet from Heart Healthy to Regular per MD approval.  - will continue to monitor for GOC/POC and associated needs.  * weigh patient today.    NUTRITION DIAGNOSIS:   Increased nutrient needs related to acute illness(COVID-19 positive patient) as evidenced by estimated needs.  GOAL:   Patient will meet greater than or equal to 90% of their needs  MONITOR:   PO intake, Supplement acceptance, Labs, Weight trends  REASON FOR ASSESSMENT:   LOS(day #10)  ASSESSMENT:   85 year old female with HTN, memory issues on Aricept. Patient presented to the ED with 2-3 day history of decreased activity and increased time spent in bed, and N/V/D. She resides at an independent living facility. In the ED, she was found to be COVID-19 positive. CXR and CT head were unremarkable.  Patient is noted to be a/o to self and place. Flow sheet documentation indicates patient consumed 0% of lunch on 12/6; 20% of breakfast, 0% of lunch, and 25% of dinner on 12/7; 0% of breakfast and lunch on 12/8; 0% of breakfast today.   Per chart review, patient weighed 184 lb and has not been weighed since that time. PTA, the most recent weight in chart was on 07/16/17 at [facility] when she weighed 181 lb.   Per notes: - patient has been refusing care at times--family is often called and on the phone so patient will be compliant with care - COVID-19 and associated PNA--remdesivir ordered - memory issues/dementia at baseline  - leukocytosis - mild hypokalemia--resolved - arrhythmia--Cardiology consulted and has signed off - debility/deconditioning/FTT   Labs  reviewed; BUN: 36 mg/dl. Medications reviewed; 100 mg remdesivir x1 dose for 4 days starting 12/7.     NUTRITION - FOCUSED PHYSICAL EXAM:  unable to complete for COVID-19 positive patient.   Diet Order:   Diet Order            Diet - low sodium heart healthy        Diet Heart Room service appropriate? Yes; Fluid consistency: Thin  Diet effective now              EDUCATION NEEDS:   No education needs have been identified at this time  Skin:  Skin Assessment: Reviewed RN Assessment  Last BM:  12/6  Height:   Ht Readings from Last 1 Encounters:  01/31/19 5\' 6"  (1.676 m)    Weight:   Wt Readings from Last 1 Encounters:  02/01/19 83.7 kg    Ideal Body Weight:  59.1 kg  BMI:  Body mass index is 29.78 kg/m.  Estimated Nutritional Needs:   Kcal:  1750-1900 kcal  Protein:  80-95 grams  Fluid:  >/= 2 L/day      Jarome Matin, MS, RD, LDN, [facility] Inpatient Clinical Dietitian Pager # [phone] After hours/weekend pager # [phone]

## 2019-02-11 LAB — COMPREHENSIVE METABOLIC PANEL
ALT: 32 U/L (ref 0–44)
AST: 30 U/L (ref 15–41)
Albumin: 2.3 g/dL — ABNORMAL LOW (ref 3.5–5.0)
Alkaline Phosphatase: 44 U/L (ref 38–126)
Anion gap: 8 (ref 5–15)
BUN: 23 mg/dL (ref 8–23)
CO2: 23 mmol/L (ref 22–32)
Calcium: 9.7 mg/dL (ref 8.9–10.3)
Chloride: 106 mmol/L (ref 98–111)
Creatinine, Ser: 0.73 mg/dL (ref 0.44–1.00)
GFR calc Af Amer: >60 mL/min (ref >60)
GFR calc non Af Amer: >60 mL/min (ref >60)
Glucose, Bld: 237 mg/dL — ABNORMAL HIGH (ref 70–99)
Potassium: 4.7 mmol/L (ref 3.5–5.1)
Sodium: 137 mmol/L (ref 135–145)
Total Bilirubin: 0.6 mg/dL (ref 0.3–1.2)
Total Protein: 5.4 g/dL — ABNORMAL LOW (ref 6.5–8.1)

## 2019-02-11 LAB — CBC WITH DIFFERENTIAL/PLATELET
Abs Immature Granulocytes: 0.28 10*3/uL — ABNORMAL HIGH (ref 0.00–0.07)
Basophils Absolute: 0.1 10*3/uL (ref 0.0–0.1)
Basophils Relative: 1 %
Eosinophils Absolute: 0 10*3/uL (ref 0.0–0.5)
Eosinophils Relative: 0 %
HCT: 45.2 % (ref 36.0–46.0)
Hemoglobin: 14.7 g/dL (ref 12.0–15.0)
Immature Granulocytes: 2 %
Lymphocytes Relative: 7 %
Lymphs Abs: 0.9 10*3/uL (ref 0.7–4.0)
MCH: 30.9 pg (ref 26.0–34.0)
MCHC: 32.5 g/dL (ref 30.0–36.0)
MCV: 95.2 fL (ref 80.0–100.0)
Monocytes Absolute: 1.4 10*3/uL — ABNORMAL HIGH (ref 0.1–1.0)
Monocytes Relative: 12 %
Neutro Abs: 9.6 10*3/uL — ABNORMAL HIGH (ref 1.7–7.7)
Neutrophils Relative %: 78 %
Platelets: 352 10*3/uL (ref 150–400)
RBC: 4.75 MIL/uL (ref 3.87–5.11)
RDW: 13.2 % (ref 11.5–15.5)
WBC: 12.3 10*3/uL — ABNORMAL HIGH (ref 4.0–10.5)
nRBC: 0 % (ref 0.0–0.2)

## 2019-02-11 LAB — FERRITIN: Ferritin: 750 ng/mL — ABNORMAL HIGH (ref 11–307)

## 2019-02-11 LAB — C-REACTIVE PROTEIN: CRP: 1 mg/dL — ABNORMAL HIGH (ref <1.0)

## 2019-02-11 LAB — D-DIMER, QUANTITATIVE: D-Dimer, Quant: 1.68 ug/mL-FEU — ABNORMAL HIGH (ref 0.00–0.50)

## 2019-02-11 MED ORDER — TRAZODONE HCL 50 MG PO TABS
25.0000 mg | ORAL_TABLET | Freq: Once | ORAL | Status: AC
  Administered 2019-02-12: 25 mg via ORAL
  Filled 2019-02-11: qty 1

## 2019-02-11 NOTE — Progress Notes (Signed)
PROGRESS NOTE  Taylor Vaughn NWG:[id number] DOB: September 09, 1929   PCP: Darrow Bussing, MD  Patient is from: Independent living facility  DOA: 01/31/2019 LOS: 11  Brief Narrative / Interim history: 85 year old female with history of HTN, dementia, HLD and radiculopathy presenting with generalized weakness, nausea, vomiting and diarrhea for 2 to 3 days POA.  In ED, positive for COVID-19. CBC, CMP, CXR and CT head without acute finding. Patient was admitted for COVID-19 infection with associated GI symptoms. However, patient desaturated to 86% on RA later in the course requiring 2 L by [place] to maintain appropriate saturation. He was started on remdesivir and a steroid per ID recommendation.  Subjective: No major events overnight of this morning.  Sleepy but wakes to voice easily.  She says her night was terrible but could not elaborate or be specific.  She denies pain.  She states her breathing is "okay".  Reports intermittent cough.  Responds "hell" when asked about place.  She denies GI or UTI symptoms.   Objective: Vitals:   02/10/19 2058 02/11/19 0652 02/11/19 0900 02/11/19 0922  BP: (!) 145/83 (!) 122/45    Pulse: 68 71    Resp: 20     Temp: (!) 97.2 F (36.2 C) (!) 96.7 F (35.9 C)    TempSrc:      SpO2: (!) 89% 90% (!) 88% 92%  Weight:      Height:        Intake/Output Summary (Last 24 hours) at 02/11/2019 1149 Last data filed at 02/10/2019 1800 Gross per 24 hour  Intake 120 ml  Output 300 ml  Net -180 ml   Filed Weights   01/31/19 2051 02/01/19 0300  Weight: 81.6 kg 83.7 kg    Examination:  GENERAL: No acute distress.  Appears well.  HEENT: MMM.  Vision and hearing grossly intact.  NECK: Supple.  No apparent JVD.  RESP:  No IWOB.  Fair air movement bilaterally. CVS:  RRR. Heart sounds normal.  ABD/GI/GU: Bowel sounds present. Soft. Non tender.  MSK/EXT:  Moves extremities. No apparent deformity or edema.  SKIN: no apparent skin lesion or wound NEURO: Awake,  alert and oriented fairly but not cooperative with mental status assessment.  No apparent focal neuro deficits PSYCH: Calm.  Not cooperative or forthcoming.  Procedures:  None  Assessment & Plan: Acute respiratory failure with hypoxia due to COVID-19 infection/pneumonia: Improving. -Desaturated to 88% on RA requiring 2 L to maintain appropriate saturation. -Inflammatory markers downtrending.  Pro-Cal negative. -Continue remdesivir and Decadron 12/8>> -Supportive care with bronchodilators, mucolytic's, antitussives, vitamins and incentive spirometry. Recent Labs    02/09/19 0406 02/11/19 0000  DDIMER 2.39* 1.68*  FERRITIN 895* 750*  CRP 4.1* 1.0*   Nausea/vomiting/diarrhea: Likely due to the above.  Resolved. -Supportive care  Memory issues/dementia: Fairly oriented but not cooperative with mental status assessment. -Frequent reorientation and delirium precautions.  Essential hypertension: Normotensive -Continue amlodipine 10 mg daily -Hydralazine 25 mg every 6 hours PRN SBP above 160 or DBP above 100.  First-degree AV block: Could be due to Aricept.  No inpatient work-up needed per cardiology -Discontinue Aricept if symptomatic or persistent bradycardia.  Leukocytosis: Likely due to steroid.  Improving. -Continue monitoring  Hypokalemia: Resolved.  AKI: Resolved. -Continue monitoring  Debility/physical deconditioning/FTT/generalized weakness.: patient is from independent living facility.  Daughter concerned about patient's debility and ability to return to ILF.  PT recommended SNF.  TOC team on board. -Continue PT/OT -OOB    Nutrition Problem: Increased nutrient needs Etiology: acute  illness(COVID-19 positive patient)  Signs/Symptoms: estimated needs  Interventions: MVI, Magic cup, Ensure Enlive (each supplement provides 350kcal and 20 grams of protein)   DVT prophylaxis: Subcu Lovenox Code Status: DNR/DNI Family Communication: Updated patient's daughter on  12/9. Disposition Plan: Remains inpatient Consultants: Cardiology (off)   Microbiology summarized: 12/6-COVID-19 positive. 11/29-blood cultures negative. 11/30-MRSA PCR negative.  Sch Meds:  Scheduled Meds: . amLODipine  10 mg Oral Daily  . dexamethasone (DECADRON) injection  6 mg Intravenous Daily  . donepezil  5 mg Oral QHS  . enoxaparin (LOVENOX) injection  40 mg Subcutaneous Daily  . feeding supplement (ENSURE ENLIVE)  237 mL Oral BID BM  . multivitamin with minerals  1 tablet Oral Daily   Continuous Infusions:  PRN Meds:.acetaminophen **OR** acetaminophen, benzonatate, hydrALAZINE, ondansetron **OR** ondansetron (ZOFRAN) IV  Antimicrobials: Anti-infectives (From admission, onward)   Start     Dose/Rate Route Frequency Ordered Stop   02/08/19 1000  remdesivir 100 mg in sodium chloride 0.9 % 100 mL IVPB     100 mg 200 mL/hr over 30 Minutes Intravenous Daily 02/07/19 1745 02/11/19 1113   02/07/19 1800  remdesivir 100 mg in sodium chloride 0.9 % 100 mL IVPB  Status:  Discontinued     100 mg 200 mL/hr over 30 Minutes Intravenous Daily 02/07/19 1719 02/07/19 1745   02/07/19 1800  remdesivir 200 mg in sodium chloride 0.9% 250 mL IVPB     200 mg 580 mL/hr over 30 Minutes Intravenous Once 02/07/19 1745 02/07/19 1911   02/02/19 1000  remdesivir 100 mg in sodium chloride 0.9 % 250 mL IVPB  Status:  Discontinued     100 mg 500 mL/hr over 30 Minutes Intravenous Every 24 hours 02/01/19 0258 02/03/19 1713   02/01/19 0230  remdesivir 200 mg in sodium chloride 0.9 % 250 mL IVPB     200 mg 500 mL/hr over 30 Minutes Intravenous Once 02/01/19 0151 02/01/19 0935       I have personally reviewed the following labs and images: CBC: Recent Labs  Lab 02/07/19 1554 02/08/19 0332 02/09/19 0406 02/11/19 0000  WBC 14.8* 11.8* 15.7* 12.3*  NEUTROABS  --   --   --  9.6*  HGB 15.3* 15.6* 15.2* 14.7  HCT 46.9* 48.9* 46.9* 45.2  MCV 95.1 97.2 94.0 95.2  PLT 345 294 327 352   BMP &GFR  Recent Labs  Lab 02/07/19 1554 02/08/19 0332 02/09/19 0406 02/11/19 0000  NA 137 138 137 137  K 4.3 4.3 4.3 4.7  CL 101 102 106 106  CO2 27 26 23 23   GLUCOSE 121* 181* 176* 237*  BUN 21 25* 36* 23  CREATININE 0.88 1.06* 0.77 0.73  CALCIUM 9.4 9.6 9.7 9.7   Estimated Creatinine Clearance: 54 mL/min (by C-G formula based on SCr of 0.73 mg/dL). Liver & Pancreas: Recent Labs  Lab 02/07/19 1554 02/08/19 0332 02/09/19 0406 02/11/19 0000  AST 21 18 19 30   ALT 38 33 31 32  ALKPHOS 54 48 48 44  BILITOT 0.5 0.4 0.6 0.6  PROT 6.4* 6.4* 6.1* 5.4*  ALBUMIN 2.7* 2.8* 2.7* 2.3*   No results for input(s): LIPASE, AMYLASE in the last 168 hours. No results for input(s): AMMONIA in the last 168 hours. Diabetic: No results for input(s): HGBA1C in the last 72 hours. No results for input(s): GLUCAP in the last 168 hours. Cardiac Enzymes: No results for input(s): CKTOTAL, CKMB, CKMBINDEX, TROPONINI in the last 168 hours. No results for input(s): PROBNP in the last 8760  hours. Coagulation Profile: No results for input(s): INR, PROTIME in the last 168 hours. Thyroid Function Tests: No results for input(s): TSH, T4TOTAL, FREET4, T3FREE, THYROIDAB in the last 72 hours. Lipid Profile: No results for input(s): CHOL, HDL, LDLCALC, TRIG, CHOLHDL, LDLDIRECT in the last 72 hours. Anemia Panel: Recent Labs    02/09/19 0406 02/11/19 0000  FERRITIN 895* 750*   Urine analysis:    Component Value Date/Time   COLORURINE YELLOW 02/02/2019 2043   APPEARANCEUR HAZY (A) 02/02/2019 2043   LABSPEC 1.012 02/02/2019 2043   PHURINE 6.0 02/02/2019 2043   GLUCOSEU NEGATIVE 02/02/2019 2043   GLUCOSEU NEGATIVE 11/15/2013 1106   HGBUR NEGATIVE 02/02/2019 2043   BILIRUBINUR NEGATIVE 02/02/2019 2043   BILIRUBINUR neg 12/31/2011 1355   KETONESUR NEGATIVE 02/02/2019 2043   PROTEINUR NEGATIVE 02/02/2019 2043   UROBILINOGEN 0.2 11/15/2013 1106   NITRITE NEGATIVE 02/02/2019 2043   LEUKOCYTESUR NEGATIVE  02/02/2019 2043   Sepsis Labs: Invalid input(s): PROCALCITONIN, LACTICIDVEN  Microbiology: Recent Results (from the past 240 hour(s))  SARS CORONAVIRUS 2 (TAT 6-24 HRS) Nasopharyngeal Nasopharyngeal Swab     Status: Abnormal   Collection Time: 02/07/19  9:00 AM   Specimen: Nasopharyngeal Swab  Result Value Ref Range Status   SARS Coronavirus 2 POSITIVE (A) NEGATIVE Final    Comment: RESULT CALLED TO, READ BACK BY AND VERIFIED WITH: L. LESLIE,RN 0420 02/08/2019 T. TYSOR (NOTE) SARS-CoV-2 target nucleic acids are DETECTED. The SARS-CoV-2 RNA is generally detectable in upper and lower respiratory specimens during the acute phase of infection. Positive results are indicative of the presence of SARS-CoV-2 RNA. Clinical correlation with patient history and other diagnostic information is  necessary to determine patient infection status. Positive results do not rule out bacterial infection or co-infection with other viruses.  The expected result is Negative. Fact Sheet for Patients: HairSlick.no Fact Sheet for Healthcare Providers: quierodirigir.com This test is not yet approved or cleared by the [place] FDA and  has been authorized for detection and/or diagnosis of SARS-CoV-2 by FDA under an Emergency Use Authorization (EUA). This EUA will remain  in effect (meaning this test can be used) for t he duration of the COVID-19 declaration under Section 564(b)(1) of the Act, 21 U.S.C. section 360bbb-3(b)(1), unless the authorization is terminated or revoked sooner. Performed at [facility], [address] [place], [place] [address]     Radiology Studies: No results found.   Jackey Housey T. Damarrion Mimbs [facility]  If 7PM-7AM, please contact night-coverage [email or web address] Password [email or web address] 02/11/2019, 11:49 AM

## 2019-02-11 NOTE — Plan of Care (Signed)
  Problem: Nutrition: Goal: Adequate nutrition will be maintained Outcome: Progressing   Problem: Coping: Goal: Level of anxiety will decrease Outcome: Progressing   

## 2019-02-11 NOTE — Care Management Important Message (Signed)
Important Message  Patient Details IM Letter given to Gabriel Earing RN Case Manager to present to the Patient Name: Taylor Vaughn MRN: [id number] Date of Birth: 1929/07/17   Medicare Important Message Given:  Yes     Kerin Salen 02/11/2019, 3:00 PM

## 2019-02-11 NOTE — Plan of Care (Signed)
Patient refused to sit in chair this shift however she did get up and down to [facility] several times with 2 max assist.  Maintains oxygen saturation around 90% without oxygen, in mid 90's with oxygen however patient dislikes oxygen and keeps pulling it off.  Patient ate 75% of breakfast, drank an Ensure and approximately 25% of her lunch, then drank another Ensure and 25% of supper.  Patient spoke with son Rush Landmark over the phone.

## 2019-02-12 LAB — COMPREHENSIVE METABOLIC PANEL
ALT: 31 U/L (ref 0–44)
AST: 20 U/L (ref 15–41)
Albumin: 2.5 g/dL — ABNORMAL LOW (ref 3.5–5.0)
Alkaline Phosphatase: 48 U/L (ref 38–126)
Anion gap: 7 (ref 5–15)
BUN: 32 mg/dL — ABNORMAL HIGH (ref 8–23)
CO2: 26 mmol/L (ref 22–32)
Calcium: 9.8 mg/dL (ref 8.9–10.3)
Chloride: 106 mmol/L (ref 98–111)
Creatinine, Ser: 0.87 mg/dL (ref 0.44–1.00)
GFR calc Af Amer: >60 mL/min (ref >60)
GFR calc non Af Amer: 60 mL/min — ABNORMAL LOW (ref >60)
Glucose, Bld: 212 mg/dL — ABNORMAL HIGH (ref 70–99)
Potassium: 4.4 mmol/L (ref 3.5–5.1)
Sodium: 139 mmol/L (ref 135–145)
Total Bilirubin: 0.8 mg/dL (ref 0.3–1.2)
Total Protein: 5.6 g/dL — ABNORMAL LOW (ref 6.5–8.1)

## 2019-02-12 LAB — CBC WITH DIFFERENTIAL/PLATELET
Abs Immature Granulocytes: 0.93 10*3/uL — ABNORMAL HIGH (ref 0.00–0.07)
Basophils Absolute: 0.2 10*3/uL — ABNORMAL HIGH (ref 0.0–0.1)
Basophils Relative: 1 %
Eosinophils Absolute: 0.1 10*3/uL (ref 0.0–0.5)
Eosinophils Relative: 0 %
HCT: 46.7 % — ABNORMAL HIGH (ref 36.0–46.0)
Hemoglobin: 15.2 g/dL — ABNORMAL HIGH (ref 12.0–15.0)
Immature Granulocytes: 5 %
Lymphocytes Relative: 6 %
Lymphs Abs: 1.2 10*3/uL (ref 0.7–4.0)
MCH: 31.1 pg (ref 26.0–34.0)
MCHC: 32.5 g/dL (ref 30.0–36.0)
MCV: 95.5 fL (ref 80.0–100.0)
Monocytes Absolute: 1.4 10*3/uL — ABNORMAL HIGH (ref 0.1–1.0)
Monocytes Relative: 7 %
Neutro Abs: 17.1 10*3/uL — ABNORMAL HIGH (ref 1.7–7.7)
Neutrophils Relative %: 81 %
Platelets: 375 10*3/uL (ref 150–400)
RBC: 4.89 MIL/uL (ref 3.87–5.11)
RDW: 13.1 % (ref 11.5–15.5)
WBC: 20.9 10*3/uL — ABNORMAL HIGH (ref 4.0–10.5)
nRBC: 0 % (ref 0.0–0.2)

## 2019-02-12 LAB — GLUCOSE, CAPILLARY
Glucose-Capillary: 157 mg/dL — ABNORMAL HIGH (ref 70–99)
Glucose-Capillary: 192 mg/dL — ABNORMAL HIGH (ref 70–99)
Glucose-Capillary: 257 mg/dL — ABNORMAL HIGH (ref 70–99)
Glucose-Capillary: 263 mg/dL — ABNORMAL HIGH (ref 70–99)

## 2019-02-12 LAB — C-REACTIVE PROTEIN: CRP: 0.8 mg/dL (ref <1.0)

## 2019-02-12 LAB — D-DIMER, QUANTITATIVE: D-Dimer, Quant: 1.64 ug/mL-FEU — ABNORMAL HIGH (ref 0.00–0.50)

## 2019-02-12 MED ORDER — INSULIN ASPART 100 UNIT/ML ~~LOC~~ SOLN
0.0000 [IU] | Freq: Every day | SUBCUTANEOUS | Status: DC
  Administered 2019-02-12: 3 [IU] via SUBCUTANEOUS
  Administered 2019-02-13 – 2019-02-14 (×2): 2 [IU] via SUBCUTANEOUS

## 2019-02-12 MED ORDER — DIPHENHYDRAMINE HCL 25 MG PO CAPS
25.0000 mg | ORAL_CAPSULE | Freq: Every evening | ORAL | Status: DC | PRN
  Administered 2019-02-12: 25 mg via ORAL
  Filled 2019-02-12: qty 1

## 2019-02-12 MED ORDER — INSULIN ASPART 100 UNIT/ML ~~LOC~~ SOLN
0.0000 [IU] | Freq: Three times a day (TID) | SUBCUTANEOUS | Status: DC
  Administered 2019-02-12 (×2): 2 [IU] via SUBCUTANEOUS
  Administered 2019-02-12: 5 [IU] via SUBCUTANEOUS
  Administered 2019-02-13: 2 [IU] via SUBCUTANEOUS
  Administered 2019-02-14 (×2): 3 [IU] via SUBCUTANEOUS
  Administered 2019-02-15 (×2): 2 [IU] via SUBCUTANEOUS
  Administered 2019-02-15: 3 [IU] via SUBCUTANEOUS
  Administered 2019-02-16 – 2019-02-17 (×4): 2 [IU] via SUBCUTANEOUS
  Administered 2019-02-18: 1 [IU] via SUBCUTANEOUS
  Administered 2019-02-18: 2 [IU] via SUBCUTANEOUS
  Administered 2019-02-19: 13:00:00 1 [IU] via SUBCUTANEOUS
  Administered 2019-02-19: 17:00:00 2 [IU] via SUBCUTANEOUS

## 2019-02-12 NOTE — Plan of Care (Signed)
  Problem: Education: Goal: Knowledge of General Education information will improve Description: Including pain rating scale, medication(s)/side effects and non-pharmacologic comfort measures Outcome: Progressing   Problem: Health Behavior/Discharge Planning: Goal: Ability to manage health-related needs will improve Outcome: Progressing   Problem: Clinical Measurements: Goal: Ability to maintain clinical measurements within normal limits will improve Outcome: Progressing Goal: Will remain free from infection Outcome: Progressing Goal: Diagnostic test results will improve Outcome: Progressing Goal: Respiratory complications will improve Outcome: Progressing Goal: Cardiovascular complication will be avoided Outcome: Progressing   Problem: Activity: Goal: Risk for activity intolerance will decrease Outcome: Progressing   Problem: Nutrition: Goal: Adequate nutrition will be maintained Outcome: Progressing   Problem: Coping: Goal: Level of anxiety will decrease Outcome: Progressing   Problem: Safety: Goal: Ability to remain free from injury will improve Outcome: Progressing   Problem: Skin Integrity: Goal: Risk for impaired skin integrity will decrease Outcome: Progressing   Problem: Education: Goal: Knowledge of risk factors and measures for prevention of condition will improve Outcome: Progressing   Problem: Coping: Goal: Psychosocial and spiritual needs will be supported Outcome: Progressing   Problem: Respiratory: Goal: Will maintain a patent airway Outcome: Progressing Goal: Complications related to the disease process, condition or treatment will be avoided or minimized Outcome: Progressing

## 2019-02-12 NOTE — TOC Progression Note (Signed)
Transition of Care [facility]) - Progression Note    Patient Details  Name: Taylor Vaughn MRN: [id number] Date of Birth: [birth year]  Transition of Care [facility]) CM/SW Contact  Purcell Mouton, RN Phone Number: 02/12/2019, 12:07 PM  Clinical Narrative:    Merry Proud Admission Coordinator from [facility] called to see if a virtual eval could be performed with pt. Pt's RN was called and is aware.         Expected Discharge Plan and Services           Expected Discharge Date: 02/02/19                                     Social Determinants of Health (SDOH) Interventions    Readmission Risk Interventions No flowsheet data found.

## 2019-02-12 NOTE — Progress Notes (Signed)
PROGRESS NOTE  Taylor Vaughn UEA:[id number] DOB: 1929/12/05   PCP: Darrow Bussing, MD  Patient is from: Independent living facility  DOA: 01/31/2019 LOS: 12  Brief Narrative / Interim history: 85 year old female with history of HTN, dementia, HLD and radiculopathy presenting with generalized weakness, nausea, vomiting and diarrhea for 2 to 3 days POA.  In ED, positive for COVID-19. CBC, CMP, CXR and CT head without acute finding. Patient was admitted for COVID-19 infection with associated GI symptoms. However, patient desaturated to 86% on RA later in the course requiring 2 L by [place] to maintain appropriate saturation. He was started on remdesivir and a steroid per ID recommendation.  Subjective: No major events overnight of this morning.  Removed his IV line and refused lab draw this morning. However, she is now agreeable to IV line and lab draws.  Complaints productive cough.  Breathing improved.  She denies chest pain, GI or UTI symptoms.  Not a great historian.  She is awake and alert but oriented to self and family name only.  Objective: Vitals:   02/11/19 1314 02/11/19 2215 02/12/19 0345 02/12/19 0828  BP: (!) 170/78 (!) 148/79 (!) 154/92   Pulse: 79 85 85 80  Resp: 18 20 20    Temp: 98.7 F (37.1 C) 97.6 F (36.4 C) 97.6 F (36.4 C)   TempSrc: Oral     SpO2: 93% 93% 93% 92%  Weight:      Height:        Intake/Output Summary (Last 24 hours) at 02/12/2019 1148 Last data filed at 02/12/2019 1000 Gross per 24 hour  Intake 540 ml  Output --  Net 540 ml   Filed Weights   01/31/19 2051 02/01/19 0300  Weight: 81.6 kg 83.7 kg    Examination:  GENERAL: No acute distress.  Appears well.  HEENT: MMM.  Vision and hearing grossly intact.  NECK: Supple.  No apparent JVD.  RESP:  No IWOB.  Diminished aeration with rhonchi bilaterally. CVS:  RRR. Heart sounds normal.  ABD/GI/GU: Bowel sounds present. Soft. Non tender.  MSK/EXT:  Moves extremities. No apparent deformity or  edema.  SKIN: no apparent skin lesion or wound NEURO: Awake, alert but oriented only to self and family name.  No apparent focal neuro deficits. PSYCH: Calm. Normal affect.  Procedures:  None  Assessment & Plan: Acute respiratory failure with hypoxia due to COVID-19 infection/pneumonia: Improving. -Saturating in low 90s on 2 L -Inflammatory markers downtrending.  Pro-Cal negative. -Continue remdesivir and Decadron 12/8>> -Supportive care with bronchodilators, mucolytic's, antitussives, vitamins and incentive spirometry. -OOB/PT/OT Recent Labs    02/11/19 0000 02/12/19 1053  DDIMER 1.68* 1.64*  FERRITIN 750*  --   CRP 1.0* 0.8   Nausea/vomiting/diarrhea: Likely due to the above.  Resolved. -Supportive care  Memory issues/dementia: Awake and alert but oriented to self and family name only. -Frequent reorientation and delirium precautions.  Essential hypertension: Normotensive -Continue amlodipine 10 mg daily -Hydralazine 25 mg every 6 hours PRN SBP above 160 or DBP above 100.  First-degree AV block: Could be due to Aricept.  No inpatient work-up needed per cardiology -Discontinue Aricept if symptomatic or persistent bradycardia.  Leukocytosis/bandemia: Likely due to steroid.  Improving. -Continue monitoring  Hypokalemia: Resolved.  AKI: Resolved. -Continue monitoring  Debility/physical deconditioning/FTT/generalized weakness: patient is from independent living facility.  Daughter concerned about patient's debility and ability to return to ILF.  PT recommended SNF.  TOC team on board. -Continue PT/OT -OOB    Nutrition Problem: Increased nutrient needs Etiology:  acute illness(COVID-19 positive patient)  Signs/Symptoms: estimated needs  Interventions: MVI, Magic cup, Ensure Enlive (each supplement provides 350kcal and 20 grams of protein)   DVT prophylaxis: Subcu Lovenox Code Status: DNR/DNI Family Communication: Updated patient's daughter on 12/9. Disposition  Plan: Remains inpatient Consultants: Cardiology (off)   Microbiology summarized: 12/6-COVID-19 positive. 11/29-blood cultures negative. 11/30-MRSA PCR negative.  Sch Meds:  Scheduled Meds: . amLODipine  10 mg Oral Daily  . dexamethasone (DECADRON) injection  6 mg Intravenous Daily  . donepezil  5 mg Oral QHS  . enoxaparin (LOVENOX) injection  40 mg Subcutaneous Daily  . feeding supplement (ENSURE ENLIVE)  237 mL Oral BID BM  . insulin aspart  0-5 Units Subcutaneous QHS  . insulin aspart  0-9 Units Subcutaneous TID WC  . multivitamin with minerals  1 tablet Oral Daily   Continuous Infusions:  PRN Meds:.acetaminophen **OR** acetaminophen, benzonatate, hydrALAZINE, ondansetron **OR** ondansetron (ZOFRAN) IV  Antimicrobials: Anti-infectives (From admission, onward)   Start     Dose/Rate Route Frequency Ordered Stop   02/08/19 1000  remdesivir 100 mg in sodium chloride 0.9 % 100 mL IVPB     100 mg 200 mL/hr over 30 Minutes Intravenous Daily 02/07/19 1745 02/11/19 1113   02/07/19 1800  remdesivir 100 mg in sodium chloride 0.9 % 100 mL IVPB  Status:  Discontinued     100 mg 200 mL/hr over 30 Minutes Intravenous Daily 02/07/19 1719 02/07/19 1745   02/07/19 1800  remdesivir 200 mg in sodium chloride 0.9% 250 mL IVPB     200 mg 580 mL/hr over 30 Minutes Intravenous Once 02/07/19 1745 02/07/19 1911   02/02/19 1000  remdesivir 100 mg in sodium chloride 0.9 % 250 mL IVPB  Status:  Discontinued     100 mg 500 mL/hr over 30 Minutes Intravenous Every 24 hours 02/01/19 0258 02/03/19 1713   02/01/19 0230  remdesivir 200 mg in sodium chloride 0.9 % 250 mL IVPB     200 mg 500 mL/hr over 30 Minutes Intravenous Once 02/01/19 0151 02/01/19 0935       I have personally reviewed the following labs and images: CBC: Recent Labs  Lab 02/07/19 1554 02/08/19 0332 02/09/19 0406 02/11/19 0000 02/12/19 1053  WBC 14.8* 11.8* 15.7* 12.3* 20.9*  NEUTROABS  --   --   --  9.6* 17.1*  HGB 15.3*  15.6* 15.2* 14.7 15.2*  HCT 46.9* 48.9* 46.9* 45.2 46.7*  MCV 95.1 97.2 94.0 95.2 95.5  PLT 345 294 327 352 375   BMP &GFR Recent Labs  Lab 02/07/19 1554 02/08/19 0332 02/09/19 0406 02/11/19 0000 02/12/19 1053  NA 137 138 137 137 139  K 4.3 4.3 4.3 4.7 4.4  CL 101 102 106 106 106  CO2 27 26 23 23 26   GLUCOSE 121* 181* 176* 237* 212*  BUN 21 25* 36* 23 32*  CREATININE 0.88 1.06* 0.77 0.73 0.87  CALCIUM 9.4 9.6 9.7 9.7 9.8   Estimated Creatinine Clearance: 49.7 mL/min (by C-G formula based on SCr of 0.87 mg/dL). Liver & Pancreas: Recent Labs  Lab 02/07/19 1554 02/08/19 0332 02/09/19 0406 02/11/19 0000 02/12/19 1053  AST 21 18 19 30 20   ALT 38 33 31 32 31  ALKPHOS 54 48 48 44 48  BILITOT 0.5 0.4 0.6 0.6 0.8  PROT 6.4* 6.4* 6.1* 5.4* 5.6*  ALBUMIN 2.7* 2.8* 2.7* 2.3* 2.5*   No results for input(s): LIPASE, AMYLASE in the last 168 hours. No results for input(s): AMMONIA in the last 168  hours. Diabetic: No results for input(s): HGBA1C in the last 72 hours. Recent Labs  Lab 02/12/19 0814 02/12/19 1118  GLUCAP 157* 192*   Cardiac Enzymes: No results for input(s): CKTOTAL, CKMB, CKMBINDEX, TROPONINI in the last 168 hours. No results for input(s): PROBNP in the last 8760 hours. Coagulation Profile: No results for input(s): INR, PROTIME in the last 168 hours. Thyroid Function Tests: No results for input(s): TSH, T4TOTAL, FREET4, T3FREE, THYROIDAB in the last 72 hours. Lipid Profile: No results for input(s): CHOL, HDL, LDLCALC, TRIG, CHOLHDL, LDLDIRECT in the last 72 hours. Anemia Panel: Recent Labs    02/11/19 0000  FERRITIN 750*   Urine analysis:    Component Value Date/Time   COLORURINE YELLOW 02/02/2019 2043   APPEARANCEUR HAZY (A) 02/02/2019 2043   LABSPEC 1.012 02/02/2019 2043   PHURINE 6.0 02/02/2019 2043   GLUCOSEU NEGATIVE 02/02/2019 2043   GLUCOSEU NEGATIVE 11/15/2013 1106   HGBUR NEGATIVE 02/02/2019 2043   BILIRUBINUR NEGATIVE 02/02/2019 2043    BILIRUBINUR neg 12/31/2011 1355   KETONESUR NEGATIVE 02/02/2019 2043   PROTEINUR NEGATIVE 02/02/2019 2043   UROBILINOGEN 0.2 11/15/2013 1106   NITRITE NEGATIVE 02/02/2019 2043   LEUKOCYTESUR NEGATIVE 02/02/2019 2043   Sepsis Labs: Invalid input(s): PROCALCITONIN, LACTICIDVEN  Microbiology: Recent Results (from the past 240 hour(s))  SARS CORONAVIRUS 2 (TAT 6-24 HRS) Nasopharyngeal Nasopharyngeal Swab     Status: Abnormal   Collection Time: 02/07/19  9:00 AM   Specimen: Nasopharyngeal Swab  Result Value Ref Range Status   SARS Coronavirus 2 POSITIVE (A) NEGATIVE Final    Comment: RESULT CALLED TO, READ BACK BY AND VERIFIED WITH: L. LESLIE,RN 0420 02/08/2019 T. TYSOR (NOTE) SARS-CoV-2 target nucleic acids are DETECTED. The SARS-CoV-2 RNA is generally detectable in upper and lower respiratory specimens during the acute phase of infection. Positive results are indicative of the presence of SARS-CoV-2 RNA. Clinical correlation with patient history and other diagnostic information is  necessary to determine patient infection status. Positive results do not rule out bacterial infection or co-infection with other viruses.  The expected result is Negative. Fact Sheet for Patients: HairSlick.no Fact Sheet for Healthcare Providers: quierodirigir.com This test is not yet approved or cleared by the [place] FDA and  has been authorized for detection and/or diagnosis of SARS-CoV-2 by FDA under an Emergency Use Authorization (EUA). This EUA will remain  in effect (meaning this test can be used) for t he duration of the COVID-19 declaration under Section 564(b)(1) of the Act, 21 U.S.C. section 360bbb-3(b)(1), unless the authorization is terminated or revoked sooner. Performed at [facility], [address] [place], [place] [address]     Radiology Studies: No results found.   Darrick Greenlaw T. Leaman Abe [facility]  If  7PM-7AM, please contact night-coverage [email or web address] Password [email or web address] 02/12/2019, 11:48 AM

## 2019-02-12 NOTE — Progress Notes (Signed)
Physical Therapy Treatment Patient Details Name: Taylor Vaughn MRN: [id number] DOB: 30-Sep-1929 Today's Date: 02/12/2019    History of Present Illness Pt is 85 y.o. female with history of hypertension and memory issues on Aricept and amlodipine over the last 2 to 3 days has been feeling more weak than usual.  Pt found to be + for COVID 19 and admitted to hospital.    PT Comments    Patient was agreeable with some encouragement to participate in PT session. Pt required greater assistance today to perform bed mobility and has significant posterior lean while sitting in EOB. Seated balance activities performed with LE kicking and pt feeding herself lunch to reduce UE support. Pt required verbal cues and assist for cutting her food. Pt required support from therapist initially and was able to maintain seated balance ~ 20 minutes with intermittent tactile cues for anterior weight shifts. Pt refused to perform sit<>stand activities and reported significant fatigue as limiting factor. After returning to supine pt instructed in bridges and performed them with verbal cues to reposition in bed for comfort. Pt continues to complain of Rt LE pain and reported "I' can't tell you how it happened...if I did I'd cry...it happened when I was 11". Acute PT will continue to follow up with patient and progress mobility as able.   Follow Up Recommendations  SNF;Supervision/Assistance - 24 hour(HHPT if family chooses for pt to return to Ind Living)     Equipment Recommendations  None recommended by PT    Recommendations for Other Services       Precautions / Restrictions Precautions Precautions: Fall Restrictions Weight Bearing Restrictions: No    Mobility  Bed Mobility Overal bed mobility: Needs Assistance Bed Mobility: Supine to Sit;Sit to Supine     Supine to sit: Max assist;HOB elevated Sit to supine: Max assist   General bed mobility comments: pt refused initially but agreeable with  encouragement to sit up and eat lunch at EOB. max assist required for LE mobility and to raise trunk up. Pt required max assist to lower to supine in bed. Pt was able to performed 3x bridge hip ups to reposition hips and shoulders in bed to become more centered. 2+ assist to boost pt up in bed.  Transfers Overall transfer level: Needs assistance Equipment used: Rolling walker (2 wheeled) Transfers: Lateral/Scoot Transfers          Lateral/Scoot Transfers: Mod assist General transfer comment: despite repeated encouragement to perform sit<>stand pt declined, mod assist provided to perform lateral scoot to move up EOB.  pt reported too fatigued to continue and SpO2 remained 93% on RA throughout session.  Ambulation/Gait          Stairs        Wheelchair Mobility    Modified Rankin (Stroke Patients Only)       Balance Overall balance assessment: Needs assistance Sitting-balance support: Bilateral upper extremity supported;Feet supported Sitting balance-Leahy Scale: Poor Sitting balance - Comments: pt has poor seated balance with significant posterior lean. she required external support from therapist initailly to maintain balance and bil UE support. Pt performed ~20 minutes of seated balance activity with LE kicking and UE use to eat lunch. intermittent tactile cues provided to facilitate anterior weigth shift to maintain upright seated posture. pt was able to correct with cues and with single or no UE support while eating. Postural control: Posterior lean           Cognition Arousal/Alertness: Awake/alert Behavior During Therapy: WFL for tasks  assessed/performed Overall Cognitive Status: History of cognitive impairments - at baseline           Exercises General Exercises - Lower Extremity Long Arc Quad: AROM;5 reps;Seated;Both Other Exercises Other Exercises: 3x bridges in supine in bed.    General Comments General comments (skin integrity, edema, etc.): Pn RA  pt's O2 sat remain 93-94% while sitting EOB      Pertinent Vitals/Pain Pain Assessment: Faces Faces Pain Scale: Hurts even more Pain Location: Rt LE (knee) Pain Descriptors / Indicators: Sore;Moaning;Grimacing Pain Intervention(s): Monitored during session;Repositioned           PT Goals (current goals can now be found in the care plan section) Acute Rehab PT Goals Patient Stated Goal: not stated; RN spoke with family who was concerned about pt returning to independent living PT Goal Formulation: Patient unable to participate in goal setting Time For Goal Achievement: 02/16/19 Potential to Achieve Goals: Good Progress towards PT goals: Not progressing toward goals - comment    Frequency    Min 2X/week      PT Plan Current plan remains appropriate       AM-PAC PT "6 Clicks" Mobility   Outcome Measure  Help needed turning from your back to your side while in a flat bed without using bedrails?: A Lot Help needed moving from lying on your back to sitting on the side of a flat bed without using bedrails?: A Lot Help needed moving to and from a bed to a chair (including a wheelchair)?: A Lot Help needed standing up from a chair using your arms (e.g., wheelchair or bedside chair)?: A Lot Help needed to walk in hospital room?: A Lot Help needed climbing 3-5 steps with a railing? : A Lot 6 Click Score: 12    End of Session Equipment Utilized During Treatment: Gait belt Activity Tolerance: Patient limited by fatigue Patient left: in bed;with call bell/phone within reach;with bed alarm set;with nursing/sitter in room Nurse Communication: Mobility status PT Visit Diagnosis: Unsteadiness on feet (R26.81);Other abnormalities of gait and mobility (R26.89);Muscle weakness (generalized) (M62.81)     Time: 1330-1407 PT Time Calculation (min) (ACUTE ONLY): 37 min  Charges:  $Therapeutic Activity: 23-37 mins                     Kipp Brood, PT, DPT Physical Therapist  with [facility]  02/12/2019 3:36 PM

## 2019-02-13 DIAGNOSIS — Z5329 Procedure and treatment not carried out because of patient's decision for other reasons: Secondary | ICD-10-CM

## 2019-02-13 DIAGNOSIS — F0391 Unspecified dementia with behavioral disturbance: Secondary | ICD-10-CM

## 2019-02-13 LAB — GLUCOSE, CAPILLARY
Glucose-Capillary: 105 mg/dL — ABNORMAL HIGH (ref 70–99)
Glucose-Capillary: 94 mg/dL (ref 70–99)
Glucose-Capillary: 185 mg/dL — ABNORMAL HIGH (ref 70–99)
Glucose-Capillary: 204 mg/dL — ABNORMAL HIGH (ref 70–99)

## 2019-02-13 LAB — CBC WITH DIFFERENTIAL/PLATELET
Abs Immature Granulocytes: 0.91 10*3/uL — ABNORMAL HIGH (ref 0.00–0.07)
Basophils Absolute: 0.2 10*3/uL — ABNORMAL HIGH (ref 0.0–0.1)
Basophils Relative: 1 %
Eosinophils Absolute: 0 10*3/uL (ref 0.0–0.5)
Eosinophils Relative: 0 %
HCT: 46.8 % — ABNORMAL HIGH (ref 36.0–46.0)
Hemoglobin: 15.7 g/dL — ABNORMAL HIGH (ref 12.0–15.0)
Immature Granulocytes: 4 %
Lymphocytes Relative: 7 %
Lymphs Abs: 1.9 10*3/uL (ref 0.7–4.0)
MCH: 31.9 pg (ref 26.0–34.0)
MCHC: 33.5 g/dL (ref 30.0–36.0)
MCV: 95.1 fL (ref 80.0–100.0)
Monocytes Absolute: 1.6 10*3/uL — ABNORMAL HIGH (ref 0.1–1.0)
Monocytes Relative: 7 %
Neutro Abs: 20.6 10*3/uL — ABNORMAL HIGH (ref 1.7–7.7)
Neutrophils Relative %: 81 %
Platelets: 403 10*3/uL — ABNORMAL HIGH (ref 150–400)
RBC: 4.92 MIL/uL (ref 3.87–5.11)
RDW: 13.2 % (ref 11.5–15.5)
WBC: 25.1 10*3/uL — ABNORMAL HIGH (ref 4.0–10.5)
nRBC: 0 % (ref 0.0–0.2)

## 2019-02-13 LAB — D-DIMER, QUANTITATIVE: D-Dimer, Quant: 1.51 ug/mL-FEU — ABNORMAL HIGH (ref 0.00–0.50)

## 2019-02-13 LAB — MAGNESIUM: Magnesium: 2.2 mg/dL (ref 1.7–2.4)

## 2019-02-13 LAB — C-REACTIVE PROTEIN: CRP: <0.5 mg/dL (ref <1.0)

## 2019-02-13 LAB — FERRITIN: Ferritin: 932 ng/mL — ABNORMAL HIGH (ref 11–307)

## 2019-02-13 MED ORDER — QUETIAPINE FUMARATE 25 MG PO TABS
25.0000 mg | ORAL_TABLET | Freq: Every day | ORAL | Status: DC | PRN
  Administered 2019-02-13 – 2019-02-16 (×2): 25 mg via ORAL
  Filled 2019-02-13 (×2): qty 1

## 2019-02-13 MED ORDER — QUETIAPINE FUMARATE 25 MG PO TABS
25.0000 mg | ORAL_TABLET | Freq: Every day | ORAL | Status: DC
  Administered 2019-02-14 – 2019-02-15 (×2): 25 mg via ORAL
  Filled 2019-02-13 (×4): qty 1

## 2019-02-13 NOTE — Progress Notes (Addendum)
Patient refusing care at this time.  Patient screaming "get out of my room, let me sleep".  Patient also threw her drink across the room. RN attempted to call daughter as well as explain to patient that we are there to take care of her.  Patient continuing to scream until staff left the room.  Will continue to monitor.

## 2019-02-13 NOTE — Progress Notes (Signed)
PROGRESS NOTE  Taylor Vaughn NGE:[id number] DOB: Oct 07, 1929   PCP: Darrow Bussing, MD  Patient is from: Independent living facility  DOA: 01/31/2019 LOS: 13  Brief Narrative / Interim history: 85 year old female with history of HTN, dementia, HLD and radiculopathy presenting with generalized weakness, nausea, vomiting and diarrhea for 2 to 3 days POA.  In ED, positive for COVID-19. CBC, CMP, CXR and CT head without acute finding. Patient was admitted for COVID-19 infection with associated GI symptoms. However, patient desaturated to 86% on RA later in the course requiring 2 L by [place] to maintain appropriate saturation. He was started on remdesivir and a steroid per ID recommendation.  Subjective: No major events overnight of this morning. Screamed " get out of here! Let me sleep"  multiple times as soon I walked in and greeted her.   Objective: Vitals:   02/12/19 1250 02/12/19 2038 02/13/19 0621 02/13/19 1134  BP: 139/81 (!) 143/85 (!) 142/94 (!) 154/84  Pulse: 81 86 74 71  Resp: (!) 25 18 20 20   Temp: 97.6 F (36.4 C) (!) 97.5 F (36.4 C) 97.7 F (36.5 C) (!) 97.4 F (36.3 C)  TempSrc: Oral Oral Oral Axillary  SpO2: 92% 94% 93% 91%  Weight:      Height:        Intake/Output Summary (Last 24 hours) at 02/13/2019 1315 Last data filed at 02/12/2019 2054 Gross per 24 hour  Intake 180 ml  Output -  Net 180 ml   Filed Weights   01/31/19 2051 02/01/19 0300  Weight: 81.6 kg 83.7 kg    Examination:  GENERAL: No acute distress.  Nontoxic HEENT: MMM.  Vision and hearing grossly intact.  RESP:  No IWOB.  On room air CVS: Vitals within normal MSK/EXT: No apparent deformity or edema.  SKIN: no apparent skin lesion or wound NEURO: Sleepy but awakes to voice easily.  No apparent focal neuro deficit. PSYCH: Angry and screamed " get out of here.  Let me sleep"  Exam limited due to patient's refusal.   Procedures:  None  Assessment & Plan: Acute respiratory failure  with hypoxia due to COVID-19 infection/pneumonia: Improving. -Saturating in low 90s on room air.  No respiratory distress noted. -Inflammatory markers downtrending.  Pro-Cal negative. -Continue remdesivir and Decadron 12/8>> -Supportive care with bronchodilators, mucolytic's, antitussives, vitamins and incentive spirometry. -OOB/PT/OT Recent Labs    02/11/19 0000 02/12/19 1053 02/13/19 1131  DDIMER 1.68* 1.64* 1.51*  FERRITIN 750*  --  932*  CRP 1.0* 0.8 <0.5   Nausea/vomiting/diarrhea: Likely due to the above.  Resolved. -Supportive care  Dementia with behavioral disturbance: agitated and refusing care.  No signs of safety concern. -Frequent reorientation and delirium precautions.  Essential hypertension: Normotensive -Continue amlodipine 10 mg daily -Hydralazine 25 mg every 6 hours PRN SBP above 160 or DBP above 100.  First-degree AV block: Could be due to Aricept.  No inpatient work-up needed per cardiology -Discontinue Aricept if symptomatic or persistent bradycardia.  Leukocytosis/bandemia: Likely due to steroid.  Improving. -Continue monitoring  Hypokalemia: Resolved.  AKI: Resolved. -Continue monitoring  Debility/physical deconditioning/FTT/generalized weakness: patient is from independent living facility.  Daughter concerned about patient's debility and ability to return to ILF.  PT recommended SNF.  TOC team on board. -Encourage OOB and PT/OT  Noncompliance with care-refusing lab draws and medications at times.  Partly due to dementia. Discussed with family-who are will to help over the phone whenever needed. -Will utilize family members    Nutrition Problem: Increased nutrient needs  Etiology: acute illness(COVID-19 positive patient)  Signs/Symptoms: estimated needs  Interventions: MVI, Magic cup, Ensure Enlive (each supplement provides 350kcal and 20 grams of protein)   DVT prophylaxis: Subcu Lovenox Code Status: DNR/DNI Family Communication: Updated  patient's daughter over the phone. Disposition Plan: Remains inpatient.  Patient is from an ILF but to weak and deconditioned to go back to ILF.  PT recommended SNF.  Difficult disposition due to COVID-19.  TOC team working with family for safe disposition. Consultants: Cardiology (off)   Microbiology summarized: 12/6-COVID-19 positive. 11/29-blood cultures negative. 11/30-MRSA PCR negative.  Sch Meds:  Scheduled Meds: . amLODipine  10 mg Oral Daily  . dexamethasone (DECADRON) injection  6 mg Intravenous Daily  . donepezil  5 mg Oral QHS  . enoxaparin (LOVENOX) injection  40 mg Subcutaneous Daily  . feeding supplement (ENSURE ENLIVE)  237 mL Oral BID BM  . insulin aspart  0-5 Units Subcutaneous QHS  . insulin aspart  0-9 Units Subcutaneous TID WC  . multivitamin with minerals  1 tablet Oral Daily   Continuous Infusions:  PRN Meds:.acetaminophen **OR** acetaminophen, benzonatate, diphenhydrAMINE, hydrALAZINE, ondansetron **OR** ondansetron (ZOFRAN) IV  Antimicrobials: Anti-infectives (From admission, onward)   Start     Dose/Rate Route Frequency Ordered Stop   02/08/19 1000  remdesivir 100 mg in sodium chloride 0.9 % 100 mL IVPB     100 mg 200 mL/hr over 30 Minutes Intravenous Daily 02/07/19 1745 02/11/19 1113   02/07/19 1800  remdesivir 100 mg in sodium chloride 0.9 % 100 mL IVPB  Status:  Discontinued     100 mg 200 mL/hr over 30 Minutes Intravenous Daily 02/07/19 1719 02/07/19 1745   02/07/19 1800  remdesivir 200 mg in sodium chloride 0.9% 250 mL IVPB     200 mg 580 mL/hr over 30 Minutes Intravenous Once 02/07/19 1745 02/07/19 1911   02/02/19 1000  remdesivir 100 mg in sodium chloride 0.9 % 250 mL IVPB  Status:  Discontinued     100 mg 500 mL/hr over 30 Minutes Intravenous Every 24 hours 02/01/19 0258 02/03/19 1713   02/01/19 0230  remdesivir 200 mg in sodium chloride 0.9 % 250 mL IVPB     200 mg 500 mL/hr over 30 Minutes Intravenous Once 02/01/19 0151 02/01/19 0935        I have personally reviewed the following labs and images: CBC: Recent Labs  Lab 02/08/19 0332 02/09/19 0406 02/11/19 0000 02/12/19 1053 02/13/19 1131  WBC 11.8* 15.7* 12.3* 20.9* 25.1*  NEUTROABS  --   --  9.6* 17.1* PENDING  HGB 15.6* 15.2* 14.7 15.2* 15.7*  HCT 48.9* 46.9* 45.2 46.7* 46.8*  MCV 97.2 94.0 95.2 95.5 95.1  PLT 294 327 352 375 403*   BMP &GFR Recent Labs  Lab 02/07/19 1554 02/08/19 0332 02/09/19 0406 02/11/19 0000 02/12/19 1053 02/13/19 1131  NA 137 138 137 137 139  --   K 4.3 4.3 4.3 4.7 4.4  --   CL 101 102 106 106 106  --   CO2 27 26 23 23 26   --   GLUCOSE 121* 181* 176* 237* 212*  --   BUN 21 25* 36* 23 32*  --   CREATININE 0.88 1.06* 0.77 0.73 0.87  --   CALCIUM 9.4 9.6 9.7 9.7 9.8  --   MG  --   --   --   --   --  2.2   Estimated Creatinine Clearance: 49.7 mL/min (by C-G formula based on SCr of 0.87 mg/dL). Liver &  Pancreas: Recent Labs  Lab 02/07/19 1554 02/08/19 0332 02/09/19 0406 02/11/19 0000 02/12/19 1053  AST 21 18 19 30 20   ALT 38 33 31 32 31  ALKPHOS 54 48 48 44 48  BILITOT 0.5 0.4 0.6 0.6 0.8  PROT 6.4* 6.4* 6.1* 5.4* 5.6*  ALBUMIN 2.7* 2.8* 2.7* 2.3* 2.5*   No results for input(s): LIPASE, AMYLASE in the last 168 hours. No results for input(s): AMMONIA in the last 168 hours. Diabetic: No results for input(s): HGBA1C in the last 72 hours. Recent Labs  Lab 02/12/19 1118 02/12/19 1631 02/12/19 2030 02/13/19 0738 02/13/19 1127  GLUCAP 192* 257* 263* 105* 94   Cardiac Enzymes: No results for input(s): CKTOTAL, CKMB, CKMBINDEX, TROPONINI in the last 168 hours. No results for input(s): PROBNP in the last 8760 hours. Coagulation Profile: No results for input(s): INR, PROTIME in the last 168 hours. Thyroid Function Tests: No results for input(s): TSH, T4TOTAL, FREET4, T3FREE, THYROIDAB in the last 72 hours. Lipid Profile: No results for input(s): CHOL, HDL, LDLCALC, TRIG, CHOLHDL, LDLDIRECT in the last 72 hours.  Anemia Panel: Recent Labs    02/11/19 0000 02/13/19 1131  FERRITIN 750* 932*   Urine analysis:    Component Value Date/Time   COLORURINE YELLOW 02/02/2019 2043   APPEARANCEUR HAZY (A) 02/02/2019 2043   LABSPEC 1.012 02/02/2019 2043   PHURINE 6.0 02/02/2019 2043   GLUCOSEU NEGATIVE 02/02/2019 2043   GLUCOSEU NEGATIVE 11/15/2013 1106   HGBUR NEGATIVE 02/02/2019 2043   BILIRUBINUR NEGATIVE 02/02/2019 2043   BILIRUBINUR neg 12/31/2011 1355   KETONESUR NEGATIVE 02/02/2019 2043   PROTEINUR NEGATIVE 02/02/2019 2043   UROBILINOGEN 0.2 11/15/2013 1106   NITRITE NEGATIVE 02/02/2019 2043   LEUKOCYTESUR NEGATIVE 02/02/2019 2043   Sepsis Labs: Invalid input(s): PROCALCITONIN, LACTICIDVEN  Microbiology: Recent Results (from the past 240 hour(s))  SARS CORONAVIRUS 2 (TAT 6-24 HRS) Nasopharyngeal Nasopharyngeal Swab     Status: Abnormal   Collection Time: 02/07/19  9:00 AM   Specimen: Nasopharyngeal Swab  Result Value Ref Range Status   SARS Coronavirus 2 POSITIVE (A) NEGATIVE Final    Comment: RESULT CALLED TO, READ BACK BY AND VERIFIED WITH: L. LESLIE,RN 0420 02/08/2019 T. TYSOR (NOTE) SARS-CoV-2 target nucleic acids are DETECTED. The SARS-CoV-2 RNA is generally detectable in upper and lower respiratory specimens during the acute phase of infection. Positive results are indicative of the presence of SARS-CoV-2 RNA. Clinical correlation with patient history and other diagnostic information is  necessary to determine patient infection status. Positive results do not rule out bacterial infection or co-infection with other viruses.  The expected result is Negative. Fact Sheet for Patients: HairSlick.no Fact Sheet for Healthcare Providers: quierodirigir.com This test is not yet approved or cleared by the [place] FDA and  has been authorized for detection and/or diagnosis of SARS-CoV-2 by FDA under an Emergency Use  Authorization (EUA). This EUA will remain  in effect (meaning this test can be used) for t he duration of the COVID-19 declaration under Section 564(b)(1) of the Act, 21 U.S.C. section 360bbb-3(b)(1), unless the authorization is terminated or revoked sooner. Performed at [facility], [address] [place], [place] [address]     Radiology Studies: No results found.   Thurmon Mizell T. Tynslee Bowlds [facility]  If 7PM-7AM, please contact night-coverage [email or web address] Password [email or web address] 02/13/2019, 1:15 PM

## 2019-02-14 DIAGNOSIS — R600 Localized edema: Secondary | ICD-10-CM

## 2019-02-14 DIAGNOSIS — R7989 Other specified abnormal findings of blood chemistry: Secondary | ICD-10-CM

## 2019-02-14 DIAGNOSIS — R4589 Other symptoms and signs involving emotional state: Secondary | ICD-10-CM

## 2019-02-14 DIAGNOSIS — Z9119 Patient's noncompliance with other medical treatment and regimen: Secondary | ICD-10-CM

## 2019-02-14 LAB — BASIC METABOLIC PANEL
Anion gap: 8 (ref 5–15)
BUN: 43 mg/dL — ABNORMAL HIGH (ref 8–23)
CO2: 29 mmol/L (ref 22–32)
Calcium: 10 mg/dL (ref 8.9–10.3)
Chloride: 102 mmol/L (ref 98–111)
Creatinine, Ser: 0.94 mg/dL (ref 0.44–1.00)
GFR calc Af Amer: >60 mL/min (ref >60)
GFR calc non Af Amer: 55 mL/min — ABNORMAL LOW (ref >60)
Glucose, Bld: 140 mg/dL — ABNORMAL HIGH (ref 70–99)
Potassium: 5.3 mmol/L — ABNORMAL HIGH (ref 3.5–5.1)
Sodium: 139 mmol/L (ref 135–145)

## 2019-02-14 LAB — MAGNESIUM: Magnesium: 2.5 mg/dL — ABNORMAL HIGH (ref 1.7–2.4)

## 2019-02-14 LAB — FERRITIN: Ferritin: 1025 ng/mL — ABNORMAL HIGH (ref 11–307)

## 2019-02-14 LAB — GLUCOSE, CAPILLARY
Glucose-Capillary: 125 mg/dL — ABNORMAL HIGH (ref 70–99)
Glucose-Capillary: 231 mg/dL — ABNORMAL HIGH (ref 70–99)
Glucose-Capillary: 205 mg/dL — ABNORMAL HIGH (ref 70–99)
Glucose-Capillary: 212 mg/dL — ABNORMAL HIGH (ref 70–99)

## 2019-02-14 LAB — C-REACTIVE PROTEIN: CRP: 0.6 mg/dL (ref <1.0)

## 2019-02-14 LAB — D-DIMER, QUANTITATIVE: D-Dimer, Quant: 1.39 ug/mL-FEU — ABNORMAL HIGH (ref 0.00–0.50)

## 2019-02-14 MED ORDER — SERTRALINE HCL 25 MG PO TABS
25.0000 mg | ORAL_TABLET | Freq: Every day | ORAL | Status: DC
  Administered 2019-02-14 – 2019-02-18 (×5): 25 mg via ORAL
  Filled 2019-02-14 (×5): qty 1

## 2019-02-14 NOTE — Progress Notes (Signed)
PROGRESS NOTE  Taylor Vaughn EXB:[id number] DOB: 04-01-1929   PCP: Darrow Bussing, MD  Patient is from: Independent living facility  DOA: 01/31/2019 LOS: 14  Brief Narrative / Interim history: 85 year old female with history of HTN, dementia, HLD and radiculopathy presenting with generalized weakness, nausea, vomiting and diarrhea for 2 to 3 days POA.  In ED, positive for COVID-19. CBC, CMP, CXR and CT head without acute finding. Patient was admitted for COVID-19 infection with associated GI symptoms. However, patient desaturated to 86% on RA later in the course requiring 2 L by [place] to maintain appropriate saturation. He was started on remdesivir and steroid per ID recommendation.  She is not stable from respiratory standpoint but physically deconditioned to return to independent living facility.  PT/OT recommended SNF.  She also refuses care intermittently.  Subjective: Refused care throughout the day yesterday.  No major events overnight.  She is calm this morning.  She says she is tired.  Denies chest pain, dyspnea, GI or UTI symptoms.  Objective: Vitals:   02/13/19 1134 02/13/19 2113 02/14/19 1014 02/14/19 1155  BP: (!) 154/84 124/78  138/74  Pulse: 71 80  80  Resp: 20 20    Temp: (!) 97.4 F (36.3 C) (!) 97.4 F (36.3 C)  97.9 F (36.6 C)  TempSrc: Axillary Oral  Oral  SpO2: 91% 93% 95% 93%  Weight:      Height:        Intake/Output Summary (Last 24 hours) at 02/14/2019 1233 Last data filed at 02/14/2019 1013 Gross per 24 hour  Intake 600 ml  Output 850 ml  Net -250 ml   Filed Weights   01/31/19 2051 02/01/19 0300  Weight: 81.6 kg 83.7 kg    Examination:  GENERAL: No apparent distress.  Nontoxic but tired. HEENT: MMM.  Vision and hearing grossly intact.  NECK: Supple.  No apparent JVD.  RESP:  No IWOB. Good air movement bilaterally. CVS:  RRR. Heart sounds normal.  ABD/GI/GU: Bowel sounds present. Soft. Non tender.  MSK/EXT:  Moves extremities. No  apparent deformity.  Left upper extremity edema distally.  SKIN: no apparent skin lesion or wound NEURO: Awake, alert and oriented fairly.  No apparent focal neuro deficit. PSYCH: Calm.  Tired looking.  Flat affect.  Procedures:  None  Assessment & Plan: Acute respiratory failure with hypoxia due to COVID-19 infection/pneumonia: Improving. -Saturating in low 90s on 2 L by [place].  No respiratory distress. -Inflammatory markers downtrending.  Pro-Cal negative. -Remdesivir from 12/8-12/12.  Decadron from 12/8- -Supportive care with bronchodilators, mucolytic's, antitussives, vitamins and incentive spirometry. -OOB/PT/OT Recent Labs    02/12/19 1053 02/13/19 1131 02/14/19 0906  DDIMER 1.64* 1.51* 1.39*  FERRITIN  --  932* 1,025*  CRP 0.8 <0.5 0.6   Nausea/vomiting/diarrhea: Likely due to the above.  Resolved. -Supportive care  Dementia with behavioral disturbance: Intermittent agitation and refusal of care.  Seems to have flat affect but no signs of safety concern. -Frequent reorientation and delirium precautions. -Started low-dose Seroquel and Zoloft  Essential hypertension: Normotensive -Continue amlodipine 10 mg daily -Hydralazine 25 mg every 6 hours PRN SBP above 160 or DBP above 100.  First-degree AV block: Could be due to Aricept.  No inpatient work-up needed per cardiology -Discontinue Aricept if symptomatic or persistent bradycardia.  Leukocytosis/bandemia: Likely due to steroid.  Improving. -Continue monitoring  Mild hyperkalemia:  -Recheck in the morning  AKI/azotemia: Stable. -Continue monitoring  Debility/physical deconditioning/FTT/generalized weakness: patient is from independent living facility.  Daughter concerned about patient's  debility and ability to return to ILF.  PT recommended SNF.  TOC team on board. -Encourage OOB and PT/OT  Noncompliance with care-refusing lab draws and medications at times. -Will utilize family members -Zoloft and Seroquel as  above.  Left upper extremity edema: Likely due to PIV. -Continue monitoring    Nutrition Problem: Increased nutrient needs Etiology: acute illness(COVID-19 positive patient)  Signs/Symptoms: estimated needs  Interventions: MVI, Magic cup, Ensure Enlive (each supplement provides 350kcal and 20 grams of protein)   DVT prophylaxis: Subcu Lovenox Code Status: DNR/DNI Family Communication: Updated patient's daughter over the phone. Disposition Plan: Remains inpatient.  Patient is from an ILF but to weak and deconditioned to go back to ILF.  PT recommended SNF.  Difficult disposition due to COVID-19.  TOC team working with family for safe disposition. Consultants: Cardiology (off)   Microbiology summarized: 12/6-COVID-19 positive. 11/29-blood cultures negative. 11/30-MRSA PCR negative.  Sch Meds:  Scheduled Meds: . amLODipine  10 mg Oral Daily  . dexamethasone (DECADRON) injection  6 mg Intravenous Daily  . donepezil  5 mg Oral QHS  . enoxaparin (LOVENOX) injection  40 mg Subcutaneous Daily  . feeding supplement (ENSURE ENLIVE)  237 mL Oral BID BM  . insulin aspart  0-5 Units Subcutaneous QHS  . insulin aspart  0-9 Units Subcutaneous TID WC  . multivitamin with minerals  1 tablet Oral Daily  . QUEtiapine  25 mg Oral QHS   Continuous Infusions:  PRN Meds:.acetaminophen **OR** acetaminophen, benzonatate, diphenhydrAMINE, hydrALAZINE, ondansetron **OR** ondansetron (ZOFRAN) IV, QUEtiapine  Antimicrobials: Anti-infectives (From admission, onward)   Start     Dose/Rate Route Frequency Ordered Stop   02/08/19 1000  remdesivir 100 mg in sodium chloride 0.9 % 100 mL IVPB     100 mg 200 mL/hr over 30 Minutes Intravenous Daily 02/07/19 1745 02/11/19 1113   02/07/19 1800  remdesivir 100 mg in sodium chloride 0.9 % 100 mL IVPB  Status:  Discontinued     100 mg 200 mL/hr over 30 Minutes Intravenous Daily 02/07/19 1719 02/07/19 1745   02/07/19 1800  remdesivir 200 mg in sodium chloride  0.9% 250 mL IVPB     200 mg 580 mL/hr over 30 Minutes Intravenous Once 02/07/19 1745 02/07/19 1911   02/02/19 1000  remdesivir 100 mg in sodium chloride 0.9 % 250 mL IVPB  Status:  Discontinued     100 mg 500 mL/hr over 30 Minutes Intravenous Every 24 hours 02/01/19 0258 02/03/19 1713   02/01/19 0230  remdesivir 200 mg in sodium chloride 0.9 % 250 mL IVPB     200 mg 500 mL/hr over 30 Minutes Intravenous Once 02/01/19 0151 02/01/19 0935       I have personally reviewed the following labs and images: CBC: Recent Labs  Lab 02/08/19 0332 02/09/19 0406 02/11/19 0000 02/12/19 1053 02/13/19 1131  WBC 11.8* 15.7* 12.3* 20.9* 25.1*  NEUTROABS  --   --  9.6* 17.1* 20.6*  HGB 15.6* 15.2* 14.7 15.2* 15.7*  HCT 48.9* 46.9* 45.2 46.7* 46.8*  MCV 97.2 94.0 95.2 95.5 95.1  PLT 294 327 352 375 403*   BMP &GFR Recent Labs  Lab 02/08/19 0332 02/09/19 0406 02/11/19 0000 02/12/19 1053 02/13/19 1131 02/14/19 0906  NA 138 137 137 139  --  139  K 4.3 4.3 4.7 4.4  --  5.3*  CL 102 106 106 106  --  102  CO2 26 23 23 26   --  29  GLUCOSE 181* 176* 237* 212*  --  140*  BUN 25* 36* 23 32*  --  43*  CREATININE 1.06* 0.77 0.73 0.87  --  0.94  CALCIUM 9.6 9.7 9.7 9.8  --  10.0  MG  --   --   --   --  2.2 2.5*   Estimated Creatinine Clearance: 46 mL/min (by C-G formula based on SCr of 0.94 mg/dL). Liver & Pancreas: Recent Labs  Lab 02/07/19 1554 02/08/19 0332 02/09/19 0406 02/11/19 0000 02/12/19 1053  AST 21 18 19 30 20   ALT 38 33 31 32 31  ALKPHOS 54 48 48 44 48  BILITOT 0.5 0.4 0.6 0.6 0.8  PROT 6.4* 6.4* 6.1* 5.4* 5.6*  ALBUMIN 2.7* 2.8* 2.7* 2.3* 2.5*   No results for input(s): LIPASE, AMYLASE in the last 168 hours. No results for input(s): AMMONIA in the last 168 hours. Diabetic: No results for input(s): HGBA1C in the last 72 hours. Recent Labs  Lab 02/13/19 1127 02/13/19 1723 02/13/19 2047 02/14/19 1008 02/14/19 1154  GLUCAP 94 185* 204* 125* 231*   Cardiac  Enzymes: No results for input(s): CKTOTAL, CKMB, CKMBINDEX, TROPONINI in the last 168 hours. No results for input(s): PROBNP in the last 8760 hours. Coagulation Profile: No results for input(s): INR, PROTIME in the last 168 hours. Thyroid Function Tests: No results for input(s): TSH, T4TOTAL, FREET4, T3FREE, THYROIDAB in the last 72 hours. Lipid Profile: No results for input(s): CHOL, HDL, LDLCALC, TRIG, CHOLHDL, LDLDIRECT in the last 72 hours. Anemia Panel: Recent Labs    02/13/19 1131 02/14/19 0906  FERRITIN 932* 1,025*   Urine analysis:    Component Value Date/Time   COLORURINE YELLOW 02/02/2019 2043   APPEARANCEUR HAZY (A) 02/02/2019 2043   LABSPEC 1.012 02/02/2019 2043   PHURINE 6.0 02/02/2019 2043   GLUCOSEU NEGATIVE 02/02/2019 2043   GLUCOSEU NEGATIVE 11/15/2013 1106   HGBUR NEGATIVE 02/02/2019 2043   BILIRUBINUR NEGATIVE 02/02/2019 2043   BILIRUBINUR neg 12/31/2011 1355   KETONESUR NEGATIVE 02/02/2019 2043   PROTEINUR NEGATIVE 02/02/2019 2043   UROBILINOGEN 0.2 11/15/2013 1106   NITRITE NEGATIVE 02/02/2019 2043   LEUKOCYTESUR NEGATIVE 02/02/2019 2043   Sepsis Labs: Invalid input(s): PROCALCITONIN, LACTICIDVEN  Microbiology: Recent Results (from the past 240 hour(s))  SARS CORONAVIRUS 2 (TAT 6-24 HRS) Nasopharyngeal Nasopharyngeal Swab     Status: Abnormal   Collection Time: 02/07/19  9:00 AM   Specimen: Nasopharyngeal Swab  Result Value Ref Range Status   SARS Coronavirus 2 POSITIVE (A) NEGATIVE Final    Comment: RESULT CALLED TO, READ BACK BY AND VERIFIED WITH: L. LESLIE,RN 0420 02/08/2019 T. TYSOR (NOTE) SARS-CoV-2 target nucleic acids are DETECTED. The SARS-CoV-2 RNA is generally detectable in upper and lower respiratory specimens during the acute phase of infection. Positive results are indicative of the presence of SARS-CoV-2 RNA. Clinical correlation with patient history and other diagnostic information is  necessary to determine patient infection  status. Positive results do not rule out bacterial infection or co-infection with other viruses.  The expected result is Negative. Fact Sheet for Patients: HairSlick.no Fact Sheet for Healthcare Providers: quierodirigir.com This test is not yet approved or cleared by the [place] FDA and  has been authorized for detection and/or diagnosis of SARS-CoV-2 by FDA under an Emergency Use Authorization (EUA). This EUA will remain  in effect (meaning this test can be used) for t he duration of the COVID-19 declaration under Section 564(b)(1) of the Act, 21 U.S.C. section 360bbb-3(b)(1), unless the authorization is terminated or revoked sooner. Performed at [facility], [address], [place], [place] [address]     Radiology Studies: No results found.   Melchor Kirchgessner T. Earnestine Shipp [facility]  If 7PM-7AM, please contact night-coverage [email or web address] Password [email or web address] 02/14/2019, 12:33 PM

## 2019-02-14 NOTE — Progress Notes (Signed)
Pt refused PM Care.

## 2019-02-15 LAB — CBC WITH DIFFERENTIAL/PLATELET
Abs Immature Granulocytes: 0.96 10*3/uL — ABNORMAL HIGH (ref 0.00–0.07)
Basophils Absolute: 0.1 10*3/uL (ref 0.0–0.1)
Basophils Relative: 1 %
Eosinophils Absolute: 0 10*3/uL (ref 0.0–0.5)
Eosinophils Relative: 0 %
HCT: 48.6 % — ABNORMAL HIGH (ref 36.0–46.0)
Hemoglobin: 15.8 g/dL — ABNORMAL HIGH (ref 12.0–15.0)
Immature Granulocytes: 5 %
Lymphocytes Relative: 4 %
Lymphs Abs: 0.8 10*3/uL (ref 0.7–4.0)
MCH: 30.9 pg (ref 26.0–34.0)
MCHC: 32.5 g/dL (ref 30.0–36.0)
MCV: 95.1 fL (ref 80.0–100.0)
Monocytes Absolute: 0.6 10*3/uL (ref 0.1–1.0)
Monocytes Relative: 3 %
Neutro Abs: 18.5 10*3/uL — ABNORMAL HIGH (ref 1.7–7.7)
Neutrophils Relative %: 87 %
Platelets: 387 10*3/uL (ref 150–400)
RBC: 5.11 MIL/uL (ref 3.87–5.11)
RDW: 13.1 % (ref 11.5–15.5)
WBC: 21 10*3/uL — ABNORMAL HIGH (ref 4.0–10.5)
nRBC: 0 % (ref 0.0–0.2)

## 2019-02-15 LAB — BASIC METABOLIC PANEL
Anion gap: 12 (ref 5–15)
BUN: 46 mg/dL — ABNORMAL HIGH (ref 8–23)
CO2: 26 mmol/L (ref 22–32)
Calcium: 10.2 mg/dL (ref 8.9–10.3)
Chloride: 99 mmol/L (ref 98–111)
Creatinine, Ser: 0.91 mg/dL (ref 0.44–1.00)
GFR calc Af Amer: >60 mL/min (ref >60)
GFR calc non Af Amer: 57 mL/min — ABNORMAL LOW (ref >60)
Glucose, Bld: 160 mg/dL — ABNORMAL HIGH (ref 70–99)
Potassium: 4.9 mmol/L (ref 3.5–5.1)
Sodium: 137 mmol/L (ref 135–145)

## 2019-02-15 LAB — GLUCOSE, CAPILLARY
Glucose-Capillary: 173 mg/dL — ABNORMAL HIGH (ref 70–99)
Glucose-Capillary: 213 mg/dL — ABNORMAL HIGH (ref 70–99)
Glucose-Capillary: 183 mg/dL — ABNORMAL HIGH (ref 70–99)
Glucose-Capillary: 197 mg/dL — ABNORMAL HIGH (ref 70–99)

## 2019-02-15 LAB — MAGNESIUM: Magnesium: 2.4 mg/dL (ref 1.7–2.4)

## 2019-02-15 LAB — D-DIMER, QUANTITATIVE: D-Dimer, Quant: 0.94 ug/mL-FEU — ABNORMAL HIGH (ref 0.00–0.50)

## 2019-02-15 LAB — C-REACTIVE PROTEIN: CRP: 0.5 mg/dL (ref <1.0)

## 2019-02-15 LAB — FERRITIN: Ferritin: 860 ng/mL — ABNORMAL HIGH (ref 11–307)

## 2019-02-15 NOTE — Progress Notes (Signed)
PROGRESS NOTE  Taylor Vaughn ZOX:[id number] DOB: May 22, 1929   PCP: Darrow Bussing, MD  Patient is from: Independent living facility  DOA: 01/31/2019 LOS: 15  Brief Narrative / Interim history: 85 year old female with history of HTN, dementia, HLD and radiculopathy presenting with generalized weakness, nausea, vomiting and diarrhea for 2 to 3 days POA.  In ED, positive for COVID-19. CBC, CMP, CXR and CT head without acute finding. Patient was admitted for COVID-19 infection with associated GI symptoms. However, patient desaturated to 86% on RA later in the course requiring 2 L by [place] to maintain appropriate saturation. He was started on remdesivir and steroid per ID recommendation.  She is not stable from respiratory standpoint but physically deconditioned to return to independent living facility.  PT/OT recommended SNF.  She also refuses care intermittently.  Subjective: No major events overnight of this morning.  No complaints this morning.  She denies chest pain, dyspnea, GI or UTI symptoms.  Objective: Vitals:   02/14/19 1155 02/14/19 1902 02/14/19 2102 02/15/19 0414  BP: 138/74  140/88 (!) 149/83  Pulse: 80  73 71  Resp:   20 20  Temp: 97.9 F (36.6 C)  97.6 F (36.4 C) 97.9 F (36.6 C)  TempSrc: Oral  Oral Oral  SpO2: 93% 93% 92% 91%  Weight:      Height:        Intake/Output Summary (Last 24 hours) at 02/15/2019 1200 Last data filed at 02/15/2019 0946 Gross per 24 hour  Intake 440 ml  Output --  Net 440 ml   Filed Weights   01/31/19 2051 02/01/19 0300  Weight: 81.6 kg 83.7 kg    Examination:  GENERAL: No acute distress.  Appears well.  HEENT: MMM.  Vision and hearing grossly intact.  NECK: Supple.  No apparent JVD.  RESP:  No IWOB. Good air movement bilaterally. CVS:  RRR. Heart sounds normal.  ABD/GI/GU: Bowel sounds present. Soft. Non tender.  MSK/EXT:  Moves extremities. No apparent deformity.  LUE edema improved. SKIN: no apparent skin lesion or  wound NEURO: Awake, alert and oriented appropriately.  No apparent focal neuro deficit. PSYCH: Calm. Normal affect today.  Procedures:  None  Assessment & Plan: Acute respiratory failure with hypoxia due to COVID-19 infection/pneumonia: Improving. -Saturating in low 90s on 2 L by [place].  No respiratory distress. -Inflammatory markers downtrending.  Pro-Cal negative. -Remdesivir from 12/8-12/12.  Decadron from 12/8- -subcu Lovenox for VTE prophylaxis -Supportive care with bronchodilators, mucolytic's, antitussives, vitamins and incentive spirometry. -Wean oxygen as able. -OOB/PT/OT-not motivated. Recent Labs    02/13/19 1131 02/14/19 0906 02/15/19 0104  DDIMER 1.51* 1.39* 0.94*  FERRITIN 932* 1,025* 860*  CRP <0.5 0.6 0.5   Nausea/vomiting/diarrhea: Likely due to the above.  Resolved. -Supportive care  Dementia with behavioral disturbance: Intermittent agitation and refusal of care.  Appropriate affect today. -Frequent reorientation and delirium precautions. -Started low-dose Seroquel and Zoloft 12/13  Essential hypertension: Normotensive -Continue amlodipine 10 mg daily -Hydralazine 25 mg every 6 hours PRN SBP above 160 or DBP above 100.  First-degree AV block: Could be due to Aricept.  No inpatient work-up needed per cardiology -Discontinue Aricept if symptomatic or persistent bradycardia.  Leukocytosis/bandemia: Likely due to steroid.  -Continue monitoring  Mild hyperkalemia: Improved. -Recheck in the morning  AKI/azotemia: Stable. -Continue monitoring  Debility/physical deconditioning/FTT/generalized weakness: patient is from independent living facility.  Daughter concerned about patient's debility and ability to return to ILF.  PT recommended SNF.  TOC team on board. -Encourage OOB and  PT/OT  Noncompliance with care-refusing lab draws and medications at times.  Dementia and possible underlying depression contributing. -Zoloft and Seroquel as above.  Left upper  extremity edema: Likely due to PIV.  Improved. -Continue monitoring    Nutrition Problem: Increased nutrient needs Etiology: acute illness(COVID-19 positive patient)  Signs/Symptoms: estimated needs  Interventions: MVI, Magic cup, Ensure Enlive (each supplement provides 350kcal and 20 grams of protein)   DVT prophylaxis: Subcu Lovenox Code Status: DNR/DNI Family Communication: Updated patient's daughter over the phone. Disposition Plan: Remains inpatient.  Patient is from an ILF but to weak and deconditioned to go back to ILF.  PT recommended SNF.  Difficult disposition due to COVID-19.  TOC team working with family for safe disposition. Consultants: Cardiology (off)   Microbiology summarized: 12/6-COVID-19 positive. 11/29-blood cultures negative. 11/30-MRSA PCR negative.  Sch Meds:  Scheduled Meds: . amLODipine  10 mg Oral Daily  . donepezil  5 mg Oral QHS  . enoxaparin (LOVENOX) injection  40 mg Subcutaneous Daily  . feeding supplement (ENSURE ENLIVE)  237 mL Oral BID BM  . insulin aspart  0-5 Units Subcutaneous QHS  . insulin aspart  0-9 Units Subcutaneous TID WC  . multivitamin with minerals  1 tablet Oral Daily  . QUEtiapine  25 mg Oral QHS  . sertraline  25 mg Oral Daily   Continuous Infusions:  PRN Meds:.acetaminophen **OR** acetaminophen, benzonatate, diphenhydrAMINE, hydrALAZINE, ondansetron **OR** ondansetron (ZOFRAN) IV, QUEtiapine  Antimicrobials: Anti-infectives (From admission, onward)   Start     Dose/Rate Route Frequency Ordered Stop   02/08/19 1000  remdesivir 100 mg in sodium chloride 0.9 % 100 mL IVPB     100 mg 200 mL/hr over 30 Minutes Intravenous Daily 02/07/19 1745 02/11/19 1113   02/07/19 1800  remdesivir 100 mg in sodium chloride 0.9 % 100 mL IVPB  Status:  Discontinued     100 mg 200 mL/hr over 30 Minutes Intravenous Daily 02/07/19 1719 02/07/19 1745   02/07/19 1800  remdesivir 200 mg in sodium chloride 0.9% 250 mL IVPB     200 mg 580 mL/hr  over 30 Minutes Intravenous Once 02/07/19 1745 02/07/19 1911   02/02/19 1000  remdesivir 100 mg in sodium chloride 0.9 % 250 mL IVPB  Status:  Discontinued     100 mg 500 mL/hr over 30 Minutes Intravenous Every 24 hours 02/01/19 0258 02/03/19 1713   02/01/19 0230  remdesivir 200 mg in sodium chloride 0.9 % 250 mL IVPB     200 mg 500 mL/hr over 30 Minutes Intravenous Once 02/01/19 0151 02/01/19 0935       I have personally reviewed the following labs and images: CBC: Recent Labs  Lab 02/09/19 0406 02/11/19 0000 02/12/19 1053 02/13/19 1131 02/15/19 0104  WBC 15.7* 12.3* 20.9* 25.1* 21.0*  NEUTROABS  --  9.6* 17.1* 20.6* 18.5*  HGB 15.2* 14.7 15.2* 15.7* 15.8*  HCT 46.9* 45.2 46.7* 46.8* 48.6*  MCV 94.0 95.2 95.5 95.1 95.1  PLT 327 352 375 403* 387   BMP &GFR Recent Labs  Lab 02/09/19 0406 02/11/19 0000 02/12/19 1053 02/13/19 1131 02/14/19 0906 02/15/19 0104 02/15/19 0910  NA 137 137 139  --  139  --  137  K 4.3 4.7 4.4  --  5.3*  --  4.9  CL 106 106 106  --  102  --  99  CO2 23 23 26   --  29  --  26  GLUCOSE 176* 237* 212*  --  140*  --  160*  BUN 36* 23 32*  --  43*  --  46*  CREATININE 0.77 0.73 0.87  --  0.94  --  0.91  CALCIUM 9.7 9.7 9.8  --  10.0  --  10.2  MG  --   --   --  2.2 2.5* 2.4  --    Estimated Creatinine Clearance: 47.5 mL/min (by C-G formula based on SCr of 0.91 mg/dL). Liver & Pancreas: Recent Labs  Lab 02/09/19 0406 02/11/19 0000 02/12/19 1053  AST 19 30 20   ALT 31 32 31  ALKPHOS 48 44 48  BILITOT 0.6 0.6 0.8  PROT 6.1* 5.4* 5.6*  ALBUMIN 2.7* 2.3* 2.5*   No results for input(s): LIPASE, AMYLASE in the last 168 hours. No results for input(s): AMMONIA in the last 168 hours. Diabetic: No results for input(s): HGBA1C in the last 72 hours. Recent Labs  Lab 02/14/19 1008 02/14/19 1154 02/14/19 1759 02/14/19 2058 02/15/19 0834  GLUCAP 125* 231* 205* 212* 173*   Cardiac Enzymes: No results for input(s): CKTOTAL, CKMB, CKMBINDEX,  TROPONINI in the last 168 hours. No results for input(s): PROBNP in the last 8760 hours. Coagulation Profile: No results for input(s): INR, PROTIME in the last 168 hours. Thyroid Function Tests: No results for input(s): TSH, T4TOTAL, FREET4, T3FREE, THYROIDAB in the last 72 hours. Lipid Profile: No results for input(s): CHOL, HDL, LDLCALC, TRIG, CHOLHDL, LDLDIRECT in the last 72 hours. Anemia Panel: Recent Labs    02/14/19 0906 02/15/19 0104  FERRITIN 1,025* 860*   Urine analysis:    Component Value Date/Time   COLORURINE YELLOW 02/02/2019 2043   APPEARANCEUR HAZY (A) 02/02/2019 2043   LABSPEC 1.012 02/02/2019 2043   PHURINE 6.0 02/02/2019 2043   GLUCOSEU NEGATIVE 02/02/2019 2043   GLUCOSEU NEGATIVE 11/15/2013 1106   HGBUR NEGATIVE 02/02/2019 2043   BILIRUBINUR NEGATIVE 02/02/2019 2043   BILIRUBINUR neg 12/31/2011 1355   KETONESUR NEGATIVE 02/02/2019 2043   PROTEINUR NEGATIVE 02/02/2019 2043   UROBILINOGEN 0.2 11/15/2013 1106   NITRITE NEGATIVE 02/02/2019 2043   LEUKOCYTESUR NEGATIVE 02/02/2019 2043   Sepsis Labs: Invalid input(s): PROCALCITONIN, LACTICIDVEN  Microbiology: Recent Results (from the past 240 hour(s))  SARS CORONAVIRUS 2 (TAT 6-24 HRS) Nasopharyngeal Nasopharyngeal Swab     Status: Abnormal   Collection Time: 02/07/19  9:00 AM   Specimen: Nasopharyngeal Swab  Result Value Ref Range Status   SARS Coronavirus 2 POSITIVE (A) NEGATIVE Final    Comment: RESULT CALLED TO, READ BACK BY AND VERIFIED WITH: L. LESLIE,RN 0420 02/08/2019 T. TYSOR (NOTE) SARS-CoV-2 target nucleic acids are DETECTED. The SARS-CoV-2 RNA is generally detectable in upper and lower respiratory specimens during the acute phase of infection. Positive results are indicative of the presence of SARS-CoV-2 RNA. Clinical correlation with patient history and other diagnostic information is  necessary to determine patient infection status. Positive results do not rule out bacterial infection or  co-infection with other viruses.  The expected result is Negative. Fact Sheet for Patients: HairSlick.no Fact Sheet for Healthcare Providers: quierodirigir.com This test is not yet approved or cleared by the [place] FDA and  has been authorized for detection and/or diagnosis of SARS-CoV-2 by FDA under an Emergency Use Authorization (EUA). This EUA will remain  in effect (meaning this test can be used) for t he duration of the COVID-19 declaration under Section 564(b)(1) of the Act, 21 U.S.C. section 360bbb-3(b)(1), unless the authorization is terminated or revoked sooner. Performed at [facility], [address],  [place], [place] [address]     Radiology Studies: No results found.   Javion Holmer T. Ankith Edmonston [facility]  If 7PM-7AM, please contact night-coverage [email or web address] Password [email or web address] 02/15/2019, 12:00 PM

## 2019-02-16 LAB — MAGNESIUM: Magnesium: 2.3 mg/dL (ref 1.7–2.4)

## 2019-02-16 LAB — BASIC METABOLIC PANEL
Anion gap: 9 (ref 5–15)
BUN: 40 mg/dL — ABNORMAL HIGH (ref 8–23)
CO2: 25 mmol/L (ref 22–32)
Calcium: 10 mg/dL (ref 8.9–10.3)
Chloride: 100 mmol/L (ref 98–111)
Creatinine, Ser: 1.01 mg/dL — ABNORMAL HIGH (ref 0.44–1.00)
GFR calc Af Amer: 58 mL/min — ABNORMAL LOW (ref >60)
GFR calc non Af Amer: 50 mL/min — ABNORMAL LOW (ref >60)
Glucose, Bld: 250 mg/dL — ABNORMAL HIGH (ref 70–99)
Potassium: 4.9 mmol/L (ref 3.5–5.1)
Sodium: 134 mmol/L — ABNORMAL LOW (ref 135–145)

## 2019-02-16 LAB — GLUCOSE, CAPILLARY
Glucose-Capillary: 193 mg/dL — ABNORMAL HIGH (ref 70–99)
Glucose-Capillary: 152 mg/dL — ABNORMAL HIGH (ref 70–99)
Glucose-Capillary: 204 mg/dL — ABNORMAL HIGH (ref 70–99)

## 2019-02-16 LAB — D-DIMER, QUANTITATIVE: D-Dimer, Quant: 1.17 ug/mL-FEU — ABNORMAL HIGH (ref 0.00–0.50)

## 2019-02-16 LAB — FERRITIN: Ferritin: 977 ng/mL — ABNORMAL HIGH (ref 11–307)

## 2019-02-16 LAB — C-REACTIVE PROTEIN: CRP: 0.6 mg/dL (ref <1.0)

## 2019-02-16 MED ORDER — ZINC OXIDE 40 % EX OINT
TOPICAL_OINTMENT | Freq: Four times a day (QID) | CUTANEOUS | Status: DC
  Administered 2019-02-20: 1 via TOPICAL
  Filled 2019-02-16: qty 57
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
## 2019-02-16 NOTE — Progress Notes (Signed)
Patient's daughter Daryel November was called back and provided with updates about patients care. All of Ms. Shina's questions were answered.
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
## 2019-02-16 NOTE — Progress Notes (Signed)
PROGRESS NOTE  Taylor Vaughn XBJ:[id number] DOB: Mar 22, 1929   PCP: Darrow Bussing, MD  Patient is from: Independent living facility  DOA: 01/31/2019 LOS: 16  Brief Narrative / Interim history: 85 year old female with history of HTN, dementia, HLD and radiculopathy presenting with generalized weakness, nausea, vomiting and diarrhea for 2 to 3 days POA.  In ED, positive for COVID-19. CBC, CMP, CXR and CT head without acute finding. Patient was admitted for COVID-19 infection with associated GI symptoms. However, patient desaturated to 86% on RA later in the course requiring 2 L by [place] to maintain appropriate saturation. He was started on remdesivir and steroid per ID recommendation.  She is not stable from respiratory standpoint but physically deconditioned to return to independent living facility.  PT/OT recommended SNF.  She also refuses care intermittently.  Subjective: No major events overnight of this morning.  No complaint this morning.  Denies chest pain, dyspnea, cough, GI or UTI symptoms.  Objective: Vitals:   02/15/19 0414 02/15/19 1216 02/15/19 2046 02/15/19 2047  BP: (!) 149/83 (!) 160/75 131/87 131/87  Pulse: 71 74 86 86  Resp: 20 20 (!) 24 (!) 24  Temp: 97.9 F (36.6 C) (!) 97.4 F (36.3 C)  (!) 97.5 F (36.4 C)  TempSrc: Oral Oral  Oral  SpO2: 91% 93% 93% 93%  Weight:      Height:        Intake/Output Summary (Last 24 hours) at 02/16/2019 1311 Last data filed at 02/16/2019 0347 Gross per 24 hour  Intake 550 ml  Output 875 ml  Net -325 ml   Filed Weights   01/31/19 2051 02/01/19 0300  Weight: 81.6 kg 83.7 kg    Examination:  GENERAL: No acute distress.  Appears well.  HEENT: MMM.  Vision and hearing grossly intact.  NECK: Supple.  No apparent JVD.  RESP:  No IWOB.  Fair aeration bilaterally. CVS:  RRR. Heart sounds normal.  ABD/GI/GU: Bowel sounds present. Soft. Non tender.  MSK/EXT:  Moves extremities. No apparent deformity or edema.  SKIN: no  apparent skin lesion or wound NEURO: Awake, alert and oriented appropriately.  No apparent focal neuro deficit. PSYCH: Calm. Normal affect. Procedures:  None  Assessment & Plan: Acute respiratory failure with hypoxia due to COVID-19 infection/pneumonia: Improving. -Saturating in low 90s on 2 L by [place].  No respiratory distress. -Inflammatory markers downtrending.  Pro-Cal negative. -Remdesivir from 12/8-12/12.  Decadron from 12/8- -subcu Lovenox for VTE prophylaxis -Supportive care with bronchodilators, mucolytic's, antitussives, vitamins and incentive spirometry. -Wean oxygen to room air as able. -OOB/PT/OT-not motivated. Recent Labs    02/14/19 0906 02/15/19 0104 02/16/19 0449  DDIMER 1.39* 0.94* 1.17*  FERRITIN 1,025* 860* 977*  CRP 0.6 0.5 0.6   Nausea/vomiting/diarrhea: Likely due to the above.  Resolved. -Supportive care  Dementia with behavioral disturbance: Intermittent agitation and refusal of care.  Good mood and normal affect today. -Frequent reorientation and delirium precautions. -Started low-dose Seroquel and Zoloft 12/13  Essential hypertension: Normotensive -Continue amlodipine 10 mg daily -Hydralazine 25 mg every 6 hours PRN SBP above 160 or DBP above 100.  First-degree AV block: Could be due to Aricept.  No inpatient work-up needed per cardiology -Discontinue Aricept if symptomatic or persistent bradycardia.  Leukocytosis/bandemia: Likely due to steroid.  -Continue monitoring  Mild hyperkalemia: Improved. -Recheck in the morning  AKI/azotemia: Stable. -Continue monitoring  Debility/physical deconditioning/FTT/generalized weakness: patient is from independent living facility.  Daughter concerned about patient's debility and ability to return to ILF.  PT recommended  SNF.  TOC team on board. -Encourage OOB and PT/OT  Noncompliance with care-refusing lab draws and medications at times.  Dementia and possible underlying depression contributing.  -Zoloft  and Seroquel as above.  Left upper extremity edema: Likely due to PIV.  Resolved.    Nutrition Problem: Increased nutrient needs Etiology: acute illness(COVID-19 positive patient)  Signs/Symptoms: estimated needs  Interventions: MVI, Magic cup, Ensure Enlive (each supplement provides 350kcal and 20 grams of protein)   DVT prophylaxis: Subcu Lovenox Code Status: DNR/DNI Family Communication: Will update later. Disposition Plan: Remains inpatient.  Patient is from an ILF but to weak and deconditioned to go back to ILF.  PT recommended SNF.  Difficult disposition due to COVID-19.  TOC team working with family for safe disposition. Consultants: Cardiology (off)   Microbiology summarized: 12/6-COVID-19 positive. 11/29-blood cultures negative. 11/30-MRSA PCR negative.  Sch Meds:  Scheduled Meds: . amLODipine  10 mg Oral Daily  . donepezil  5 mg Oral QHS  . enoxaparin (LOVENOX) injection  40 mg Subcutaneous Daily  . feeding supplement (ENSURE ENLIVE)  237 mL Oral BID BM  . insulin aspart  0-5 Units Subcutaneous QHS  . insulin aspart  0-9 Units Subcutaneous TID WC  . multivitamin with minerals  1 tablet Oral Daily  . QUEtiapine  25 mg Oral QHS  . sertraline  25 mg Oral Daily   Continuous Infusions:  PRN Meds:.acetaminophen **OR** acetaminophen, benzonatate, diphenhydrAMINE, hydrALAZINE, ondansetron **OR** ondansetron (ZOFRAN) IV, QUEtiapine  Antimicrobials: Anti-infectives (From admission, onward)   Start     Dose/Rate Route Frequency Ordered Stop   02/08/19 1000  remdesivir 100 mg in sodium chloride 0.9 % 100 mL IVPB     100 mg 200 mL/hr over 30 Minutes Intravenous Daily 02/07/19 1745 02/11/19 1113   02/07/19 1800  remdesivir 100 mg in sodium chloride 0.9 % 100 mL IVPB  Status:  Discontinued     100 mg 200 mL/hr over 30 Minutes Intravenous Daily 02/07/19 1719 02/07/19 1745   02/07/19 1800  remdesivir 200 mg in sodium chloride 0.9% 250 mL IVPB     200 mg 580 mL/hr over 30  Minutes Intravenous Once 02/07/19 1745 02/07/19 1911   02/02/19 1000  remdesivir 100 mg in sodium chloride 0.9 % 250 mL IVPB  Status:  Discontinued     100 mg 500 mL/hr over 30 Minutes Intravenous Every 24 hours 02/01/19 0258 02/03/19 1713   02/01/19 0230  remdesivir 200 mg in sodium chloride 0.9 % 250 mL IVPB     200 mg 500 mL/hr over 30 Minutes Intravenous Once 02/01/19 0151 02/01/19 0935       I have personally reviewed the following labs and images: CBC: Recent Labs  Lab 02/11/19 0000 02/12/19 1053 02/13/19 1131 02/15/19 0104  WBC 12.3* 20.9* 25.1* 21.0*  NEUTROABS 9.6* 17.1* 20.6* 18.5*  HGB 14.7 15.2* 15.7* 15.8*  HCT 45.2 46.7* 46.8* 48.6*  MCV 95.2 95.5 95.1 95.1  PLT 352 375 403* 387   BMP &GFR Recent Labs  Lab 02/11/19 0000 02/12/19 1053 02/13/19 1131 02/14/19 0906 02/15/19 0104 02/15/19 0910 02/16/19 0449  NA 137 139  --  139  --  137 134*  K 4.7 4.4  --  5.3*  --  4.9 4.9  CL 106 106  --  102  --  99 100  CO2 23 26  --  29  --  26 25  GLUCOSE 237* 212*  --  140*  --  160* 250*  BUN 23 32*  --  43*  --  46* 40*  CREATININE 0.73 0.87  --  0.94  --  0.91 1.01*  CALCIUM 9.7 9.8  --  10.0  --  10.2 10.0  MG  --   --  2.2 2.5* 2.4  --  2.3   Estimated Creatinine Clearance: 42.8 mL/min (A) (by C-G formula based on SCr of 1.01 mg/dL (H)). Liver & Pancreas: Recent Labs  Lab 02/11/19 0000 02/12/19 1053  AST 30 20  ALT 32 31  ALKPHOS 44 48  BILITOT 0.6 0.8  PROT 5.4* 5.6*  ALBUMIN 2.3* 2.5*   No results for input(s): LIPASE, AMYLASE in the last 168 hours. No results for input(s): AMMONIA in the last 168 hours. Diabetic: No results for input(s): HGBA1C in the last 72 hours. Recent Labs  Lab 02/15/19 0834 02/15/19 1213 02/15/19 1603 02/15/19 2049 02/16/19 0824  GLUCAP 173* 213* 183* 197* 193*   Cardiac Enzymes: No results for input(s): CKTOTAL, CKMB, CKMBINDEX, TROPONINI in the last 168 hours. No results for input(s): PROBNP in the last 8760  hours. Coagulation Profile: No results for input(s): INR, PROTIME in the last 168 hours. Thyroid Function Tests: No results for input(s): TSH, T4TOTAL, FREET4, T3FREE, THYROIDAB in the last 72 hours. Lipid Profile: No results for input(s): CHOL, HDL, LDLCALC, TRIG, CHOLHDL, LDLDIRECT in the last 72 hours. Anemia Panel: Recent Labs    02/15/19 0104 02/16/19 0449  FERRITIN 860* 977*   Urine analysis:    Component Value Date/Time   COLORURINE YELLOW 02/02/2019 2043   APPEARANCEUR HAZY (A) 02/02/2019 2043   LABSPEC 1.012 02/02/2019 2043   PHURINE 6.0 02/02/2019 2043   GLUCOSEU NEGATIVE 02/02/2019 2043   GLUCOSEU NEGATIVE 11/15/2013 1106   HGBUR NEGATIVE 02/02/2019 2043   BILIRUBINUR NEGATIVE 02/02/2019 2043   BILIRUBINUR neg 12/31/2011 1355   KETONESUR NEGATIVE 02/02/2019 2043   PROTEINUR NEGATIVE 02/02/2019 2043   UROBILINOGEN 0.2 11/15/2013 1106   NITRITE NEGATIVE 02/02/2019 2043   LEUKOCYTESUR NEGATIVE 02/02/2019 2043   Sepsis Labs: Invalid input(s): PROCALCITONIN, LACTICIDVEN  Microbiology: Recent Results (from the past 240 hour(s))  SARS CORONAVIRUS 2 (TAT 6-24 HRS) Nasopharyngeal Nasopharyngeal Swab     Status: Abnormal   Collection Time: 02/07/19  9:00 AM   Specimen: Nasopharyngeal Swab  Result Value Ref Range Status   SARS Coronavirus 2 POSITIVE (A) NEGATIVE Final    Comment: RESULT CALLED TO, READ BACK BY AND VERIFIED WITH: L. LESLIE,RN 0420 02/08/2019 T. TYSOR (NOTE) SARS-CoV-2 target nucleic acids are DETECTED. The SARS-CoV-2 RNA is generally detectable in upper and lower respiratory specimens during the acute phase of infection. Positive results are indicative of the presence of SARS-CoV-2 RNA. Clinical correlation with patient history and other diagnostic information is  necessary to determine patient infection status. Positive results do not rule out bacterial infection or co-infection with other viruses.  The expected result is Negative. Fact Sheet for  Patients: HairSlick.no Fact Sheet for Healthcare Providers: quierodirigir.com This test is not yet approved or cleared by the [place] FDA and  has been authorized for detection and/or diagnosis of SARS-CoV-2 by FDA under an Emergency Use Authorization (EUA). This EUA will remain  in effect (meaning this test can be used) for t he duration of the COVID-19 declaration under Section 564(b)(1) of the Act, 21 U.S.C. section 360bbb-3(b)(1), unless the authorization is terminated or revoked sooner. Performed at [facility], [address] [place], [place] [address]     Radiology Studies: No results found.   Korinna Tat T.  Kazimir Hartnett [facility]  If 7PM-7AM, please contact night-coverage [email or web address] Password [email or web address] 02/16/2019, 1:11 PM

## 2019-02-17 ENCOUNTER — Inpatient Hospital Stay (HOSPITAL_COMMUNITY): Payer: Medicare Other

## 2019-02-17 DIAGNOSIS — M25561 Pain in right knee: Secondary | ICD-10-CM

## 2019-02-17 LAB — COMPREHENSIVE METABOLIC PANEL
ALT: 34 U/L (ref 0–44)
AST: 17 U/L (ref 15–41)
Albumin: 2.5 g/dL — ABNORMAL LOW (ref 3.5–5.0)
Alkaline Phosphatase: 45 U/L (ref 38–126)
Anion gap: 5 (ref 5–15)
BUN: 42 mg/dL — ABNORMAL HIGH (ref 8–23)
CO2: 27 mmol/L (ref 22–32)
Calcium: 10 mg/dL (ref 8.9–10.3)
Chloride: 104 mmol/L (ref 98–111)
Creatinine, Ser: 0.91 mg/dL (ref 0.44–1.00)
GFR calc Af Amer: >60 mL/min (ref >60)
GFR calc non Af Amer: 57 mL/min — ABNORMAL LOW (ref >60)
Glucose, Bld: 158 mg/dL — ABNORMAL HIGH (ref 70–99)
Potassium: 4.8 mmol/L (ref 3.5–5.1)
Sodium: 136 mmol/L (ref 135–145)
Total Bilirubin: 0.9 mg/dL (ref 0.3–1.2)
Total Protein: 5.7 g/dL — ABNORMAL LOW (ref 6.5–8.1)

## 2019-02-17 LAB — CBC WITH DIFFERENTIAL/PLATELET
Abs Immature Granulocytes: 0.84 10*3/uL — ABNORMAL HIGH (ref 0.00–0.07)
Basophils Absolute: 0.1 10*3/uL (ref 0.0–0.1)
Basophils Relative: 1 %
Eosinophils Absolute: 0.1 10*3/uL (ref 0.0–0.5)
Eosinophils Relative: 0 %
HCT: 48 % — ABNORMAL HIGH (ref 36.0–46.0)
Hemoglobin: 15.7 g/dL — ABNORMAL HIGH (ref 12.0–15.0)
Immature Granulocytes: 4 %
Lymphocytes Relative: 5 %
Lymphs Abs: 1.2 10*3/uL (ref 0.7–4.0)
MCH: 30.9 pg (ref 26.0–34.0)
MCHC: 32.7 g/dL (ref 30.0–36.0)
MCV: 94.5 fL (ref 80.0–100.0)
Monocytes Absolute: 1.9 10*3/uL — ABNORMAL HIGH (ref 0.1–1.0)
Monocytes Relative: 8 %
Neutro Abs: 19.6 10*3/uL — ABNORMAL HIGH (ref 1.7–7.7)
Neutrophils Relative %: 82 %
Platelets: 357 10*3/uL (ref 150–400)
RBC: 5.08 MIL/uL (ref 3.87–5.11)
RDW: 13.1 % (ref 11.5–15.5)
WBC: 23.8 10*3/uL — ABNORMAL HIGH (ref 4.0–10.5)
nRBC: 0 % (ref 0.0–0.2)

## 2019-02-17 LAB — GLUCOSE, CAPILLARY
Glucose-Capillary: 176 mg/dL — ABNORMAL HIGH (ref 70–99)
Glucose-Capillary: 161 mg/dL — ABNORMAL HIGH (ref 70–99)
Glucose-Capillary: 120 mg/dL — ABNORMAL HIGH (ref 70–99)
Glucose-Capillary: 139 mg/dL — ABNORMAL HIGH (ref 70–99)

## 2019-02-17 LAB — D-DIMER, QUANTITATIVE: D-Dimer, Quant: 1.07 ug/mL-FEU — ABNORMAL HIGH (ref 0.00–0.50)

## 2019-02-17 LAB — C-REACTIVE PROTEIN: CRP: 0.6 mg/dL (ref <1.0)

## 2019-02-17 LAB — FERRITIN: Ferritin: 1108 ng/mL — ABNORMAL HIGH (ref 11–307)

## 2019-02-17 MED ORDER — QUETIAPINE FUMARATE 50 MG PO TABS
50.0000 mg | ORAL_TABLET | Freq: Every day | ORAL | Status: DC
  Administered 2019-02-17: 50 mg via ORAL
  Filled 2019-02-17 (×2): qty 1

## 2019-02-17 MED ORDER — DM-GUAIFENESIN ER 30-600 MG PO TB12
1.0000 | ORAL_TABLET | Freq: Two times a day (BID) | ORAL | Status: DC
  Administered 2019-02-17 – 2019-02-22 (×8): 1 via ORAL
  Filled 2019-02-17 (×10): qty 1

## 2019-02-17 NOTE — TOC Progression Note (Signed)
Transition of Care [facility]) - Progression Note    Patient Details  Name: Taylor Vaughn MRN: [id number] Date of Birth: 10-20-1929  [facility] CM/SW Contact  Purcell Mouton, RN Phone Number: 02/17/2019, 12:37 PM  Clinical Narrative:     Spoke with pt's daughter concerning discharge plans. Explained to daughter that pt is ready for discharge, that [place] [facility] a bed to her mother.        Expected Discharge Plan and Services           Expected Discharge Date: 02/02/19                                     Social Determinants of Health (SDOH) Interventions    Readmission Risk Interventions No flowsheet data found.

## 2019-02-17 NOTE — Progress Notes (Signed)
PROGRESS NOTE  Taylor Vaughn WUJ:[id number] DOB: [birth year]   PCP: Darrow Bussing, MD  Patient is from: Independent living facility  DOA: 01/31/2019 LOS: 17  Brief Narrative / Interim history: 85 year old female with history of HTN, dementia, HLD and radiculopathy presenting with generalized weakness, nausea, vomiting and diarrhea for 2 to 3 days POA.  In ED, positive for COVID-19. CBC, CMP, CXR and CT head without acute finding. Patient was admitted for COVID-19 infection with associated GI symptoms. However, patient desaturated to 86% on RA later in the course requiring 2 L by [place] to maintain appropriate saturation. He was started on remdesivir and steroid per ID recommendation.  She is not stable from respiratory standpoint but physically deconditioned to return to independent living facility.  PT/OT recommended SNF.  However, patient's daughter turned down bed offers at two skilled nursing facilities, and asked for disposition to [facility]. RN from [facility] to virtually evaluate patient before making decision.  Subjective: Per overnight RN report, patient was agitated and combative.  Appears calm this morning.  Has no complaints.  She denies shortness of breath, chest pain, cough, GI or UTI symptoms but very poor historian.  She is awake and alert but only oriented to self.   Objective: Vitals:   02/16/19 0913 02/16/19 2040 02/17/19 0510 02/17/19 1312  BP: 131/81 116/73 122/82 (!) 157/94  Pulse: 85 97 82 81  Resp: (!) 24 (!) 22 (!) 22 19  Temp: 98.9 F (37.2 C) (!) 97.4 F (36.3 C) (!) 97.5 F (36.4 C) 98.4 F (36.9 C)  TempSrc: Oral Oral Oral Oral  SpO2: 92% 94% 96% 92%  Weight:      Height:        Intake/Output Summary (Last 24 hours) at 02/17/2019 1521 Last data filed at 02/17/2019 1334 Gross per 24 hour  Intake 240 ml  Output 300 ml  Net -60 ml   Filed Weights   01/31/19 2051 02/01/19 0300  Weight: 81.6 kg 83.7 kg    Examination:  GENERAL: No acute  distress.  Appears well.  HEENT: MMM.  Vision and hearing grossly intact.  NECK: Supple.  No apparent JVD.  RESP:  No IWOB.  Fair aeration bilaterally. CVS:  RRR. Heart sounds normal.  ABD/GI/GU: Bowel sounds present. Soft. Non tender.  MSK/EXT: Right lower extremity weakness.  Right knee swelling and tenderness.  No overlying skin erythema.  Refused further exam of the right knee. SKIN: no apparent skin lesion or wound NEURO: Awake, alert and oriented to self and family name.  Noted right lower extremity weakness likely due to right knee pain. PSYCH: Calm. Normal affect.  Procedures:  None  Assessment & Plan: Acute respiratory failure with hypoxia due to COVID-19 infection/pneumonia: -Saturating well on room air.  No respiratory distress. -Inflammatory markers downtrending.  Pro-Cal negative. -Remdesivir from 12/8-12/12.  Decadron from 12/8-12/14 -subcu Lovenox for VTE prophylaxis -Supportive care with bronchodilators, mucolytic's, antitussives, vitamins and incentive spirometry. -OOB/PT/OT-not motivated. Recent Labs    02/15/19 0104 02/16/19 0449 02/17/19 0053  DDIMER 0.94* 1.17* 1.07*  FERRITIN 860* 977* 1,108*  CRP 0.5 0.6 0.6   Nausea/vomiting/diarrhea: Likely due to the above.  Resolved. -Supportive care  Dementia with behavioral disturbance: Intermittent agitation and refusal of care.  -Frequent reorientation and delirium precautions. -Increase nightly Seroquel to 50 mg daily with 25 mg as needed agitation -Continue Zoloft 25 mg 12/13 >>  Essential hypertension: Normotensive -Continue amlodipine 10 mg daily -Hydralazine 25 mg every 6 hours PRN SBP above 160 or  DBP above 100.  First-degree AV block: Could be due to Aricept.  No inpatient work-up needed per cardiology -Discontinue Aricept if symptomatic or persistent bradycardia.  Leukocytosis/bandemia: suspect this to be due to steroid.  Completed 7 days of Decadron 2 days ago.  She is afebrile.  Respiratory status  improved.  However, she had right knee tenderness and mild swelling -Right knee x-ray -Check uric acid  Right knee pain/tenderness: Limited exam as patient refused -Right knee x-ray -Check uric acid  Mild hyperkalemia: Resolved. -Recheck in the morning  AKI/azotemia: Resolving. -Continue monitoring  Debility/physical deconditioning/FTT/generalized weakness: patient is from independent living facility.  Daughter concerned about patient's debility and ability to return to ILF.  PT recommended SNF.  TOC team on board. -Encourage OOB and PT/OT  Noncompliance with care-refusing lab draws and medications at times.  Dementia and possible underlying depression contributing.  -Zoloft and Seroquel as above.  Left upper extremity edema: Likely due to PIV.  Resolved.    Nutrition Problem: Increased nutrient needs Etiology: acute illness(COVID-19 positive patient)  Signs/Symptoms: estimated needs  Interventions: MVI, Magic cup, Ensure Enlive (each supplement provides 350kcal and 20 grams of protein)   DVT prophylaxis: Subcu Lovenox Code Status: DNR/DNI Family Communication: Will update later. Disposition Plan: Remains inpatient.  Patient is from an ILF but to weak and deconditioned  PT recommended SNF.  Family turned her down to skilled nursing facilities and requested ALF.  TOC and family working on this. Consultants: Cardiology (off)   Microbiology summarized: 12/6-COVID-19 positive. 11/29-blood cultures negative. 11/30-MRSA PCR negative.  Sch Meds:  Scheduled Meds: . amLODipine  10 mg Oral Daily  . donepezil  5 mg Oral QHS  . enoxaparin (LOVENOX) injection  40 mg Subcutaneous Daily  . feeding supplement (ENSURE ENLIVE)  237 mL Oral BID BM  . insulin aspart  0-5 Units Subcutaneous QHS  . insulin aspart  0-9 Units Subcutaneous TID WC  . liver oil-zinc oxide   Topical QID  . multivitamin with minerals  1 tablet Oral Daily  . QUEtiapine  25 mg Oral QHS  . sertraline  25 mg Oral  Daily   Continuous Infusions:  PRN Meds:.acetaminophen **OR** acetaminophen, benzonatate, diphenhydrAMINE, hydrALAZINE, ondansetron **OR** ondansetron (ZOFRAN) IV, QUEtiapine  Antimicrobials: Anti-infectives (From admission, onward)   Start     Dose/Rate Route Frequency Ordered Stop   02/08/19 1000  remdesivir 100 mg in sodium chloride 0.9 % 100 mL IVPB     100 mg 200 mL/hr over 30 Minutes Intravenous Daily 02/07/19 1745 02/11/19 1113   02/07/19 1800  remdesivir 100 mg in sodium chloride 0.9 % 100 mL IVPB  Status:  Discontinued     100 mg 200 mL/hr over 30 Minutes Intravenous Daily 02/07/19 1719 02/07/19 1745   02/07/19 1800  remdesivir 200 mg in sodium chloride 0.9% 250 mL IVPB     200 mg 580 mL/hr over 30 Minutes Intravenous Once 02/07/19 1745 02/07/19 1911   02/02/19 1000  remdesivir 100 mg in sodium chloride 0.9 % 250 mL IVPB  Status:  Discontinued     100 mg 500 mL/hr over 30 Minutes Intravenous Every 24 hours 02/01/19 0258 02/03/19 1713   02/01/19 0230  remdesivir 200 mg in sodium chloride 0.9 % 250 mL IVPB     200 mg 500 mL/hr over 30 Minutes Intravenous Once 02/01/19 0151 02/01/19 0935       I have personally reviewed the following labs and images: CBC: Recent Labs  Lab 02/11/19 0000 02/12/19 1053 02/13/19 1131  02/15/19 0104 02/17/19 0053  WBC 12.3* 20.9* 25.1* 21.0* 23.8*  NEUTROABS 9.6* 17.1* 20.6* 18.5* 19.6*  HGB 14.7 15.2* 15.7* 15.8* 15.7*  HCT 45.2 46.7* 46.8* 48.6* 48.0*  MCV 95.2 95.5 95.1 95.1 94.5  PLT 352 375 403* 387 357   BMP &GFR Recent Labs  Lab 02/12/19 1053 02/13/19 1131 02/14/19 0906 02/15/19 0104 02/15/19 0910 02/16/19 0449 02/17/19 0053  NA 139  --  139  --  137 134* 136  K 4.4  --  5.3*  --  4.9 4.9 4.8  CL 106  --  102  --  99 100 104  CO2 26  --  29  --  26 25 27   GLUCOSE 212*  --  140*  --  160* 250* 158*  BUN 32*  --  43*  --  46* 40* 42*  CREATININE 0.87  --  0.94  --  0.91 1.01* 0.91  CALCIUM 9.8  --  10.0  --  10.2 10.0  10.0  MG  --  2.2 2.5* 2.4  --  2.3  --    Estimated Creatinine Clearance: 47.5 mL/min (by C-G formula based on SCr of 0.91 mg/dL). Liver & Pancreas: Recent Labs  Lab 02/11/19 0000 02/12/19 1053 02/17/19 0053  AST 30 20 17   ALT 32 31 34  ALKPHOS 44 48 45  BILITOT 0.6 0.8 0.9  PROT 5.4* 5.6* 5.7*  ALBUMIN 2.3* 2.5* 2.5*   No results for input(s): LIPASE, AMYLASE in the last 168 hours. No results for input(s): AMMONIA in the last 168 hours. Diabetic: No results for input(s): HGBA1C in the last 72 hours. Recent Labs  Lab 02/16/19 0824 02/16/19 1804 02/16/19 2035 02/17/19 0907 02/17/19 1134  GLUCAP 193* 152* 204* 176* 161*   Cardiac Enzymes: No results for input(s): CKTOTAL, CKMB, CKMBINDEX, TROPONINI in the last 168 hours. No results for input(s): PROBNP in the last 8760 hours. Coagulation Profile: No results for input(s): INR, PROTIME in the last 168 hours. Thyroid Function Tests: No results for input(s): TSH, T4TOTAL, FREET4, T3FREE, THYROIDAB in the last 72 hours. Lipid Profile: No results for input(s): CHOL, HDL, LDLCALC, TRIG, CHOLHDL, LDLDIRECT in the last 72 hours. Anemia Panel: Recent Labs    02/16/19 0449 02/17/19 0053  FERRITIN 977* 1,108*   Urine analysis:    Component Value Date/Time   COLORURINE YELLOW 02/02/2019 2043   APPEARANCEUR HAZY (A) 02/02/2019 2043   LABSPEC 1.012 02/02/2019 2043   PHURINE 6.0 02/02/2019 2043   GLUCOSEU NEGATIVE 02/02/2019 2043   GLUCOSEU NEGATIVE 11/15/2013 1106   HGBUR NEGATIVE 02/02/2019 2043   BILIRUBINUR NEGATIVE 02/02/2019 2043   BILIRUBINUR neg 12/31/2011 1355   KETONESUR NEGATIVE 02/02/2019 2043   PROTEINUR NEGATIVE 02/02/2019 2043   UROBILINOGEN 0.2 11/15/2013 1106   NITRITE NEGATIVE 02/02/2019 2043   LEUKOCYTESUR NEGATIVE 02/02/2019 2043   Sepsis Labs: Invalid input(s): PROCALCITONIN, LACTICIDVEN  Microbiology: No results found for this or any previous visit (from the past 240 hour(s)).  Radiology  Studies: DG Knee 1-2 Views Right  Result Date: 02/17/2019 CLINICAL DATA:  Right knee pain and swelling. EXAM: RIGHT KNEE - 1-2 VIEW COMPARISON:  None. FINDINGS: No fracture or dislocation. Mild degenerative change of the right knee and patellofemoral joints with joint space loss and subchondral sclerosis. There is minimal spurring of the tibial spines. No evidence of chondrocalcinosis. No joint effusion. Minimal enthesopathic change involving the superior pole of the patella. Scattered adjacent vascular calcifications. Regional soft tissues appear otherwise normal. No radiopaque foreign  body. IMPRESSION: 1. No acute findings. 2. Mild degenerative change of the knee. Electronically Signed   By: Simonne Come M.D.   On: 02/17/2019 11:10     Angellynn Kimberlin T. Jalin Alicea [facility]  If 7PM-7AM, please contact night-coverage [email or web address] Password [email or web address] 02/17/2019, 3:21 PM

## 2019-02-17 NOTE — Progress Notes (Signed)
Attempted virtual assessment with [facility]. Patient not following directions for assessment such as turning on her side in bed. Remained non-verbal as well. Spoke with daughter about assessment. Eulas Post, RN

## 2019-02-17 NOTE — Plan of Care (Signed)
  Problem: Clinical Measurements: Goal: Cardiovascular complication will be avoided Outcome: Progressing   Problem: Safety: Goal: Ability to remain free from injury will improve Outcome: Progressing   Problem: Skin Integrity: Goal: Risk for impaired skin integrity will decrease Outcome: Progressing   Problem: Coping: Goal: Psychosocial and spiritual needs will be supported Outcome: Progressing   Problem: Respiratory: Goal: Will maintain a patent airway Outcome: Progressing

## 2019-02-17 NOTE — NC FL2 (Signed)
[place] LEVEL OF CARE SCREENING TOOL     IDENTIFICATION  Patient Name: Taylor Vaughn Birthdate: May 04, 1929 Sex: female Admission Date (Current Location): 01/31/2019  [facility] and [place] Number:  Herbalist and Address:         Provider Number: [id number]  Attending Physician Name and Address:  Mercy Riding, MD  Relative Name and Phone Number:  Daryel November [phone]    Current Level of Care: Hospital Recommended Level of Care: [place] Prior Approval Number:    Date Approved/Denied:   PASRR Number:    Discharge Plan: Other (Comment)(ALF)    Current Diagnoses: Patient Active Problem List   Diagnosis Date Noted  . SIRS (systemic inflammatory response syndrome) ([place]) 02/01/2019  . COVID-19 virus infection 01/31/2019  . Vertigo 11/06/2016  . Gait abnormality 11/06/2016  . Sciatica associated with disorder of lumbar spine 05/29/2015  . Right leg pain 05/02/2015  . Right knee pain 05/02/2015  . Lesion of skin of face 05/02/2015  . Maxillary sinusitis, acute 03/14/2015  . Cough 03/14/2015  . Left knee injury 10/21/2014  . Cataract 01/11/2013  . Tinea corporis 11/15/2012  . Cervical cancer screening 03/19/2012  . Renal artery stenosis ([place]) 11/24/2011  . Allergic rhinitis 11/22/2011  . Right shoulder strain 10/03/2011  . MRSA (methicillin resistant staph aureus) culture positive 10/03/2011  . Grief reaction 10/03/2011  . Candidal skin infection 10/03/2011  . Urinary frequency 07/22/2011  . Acute chest wall pain 07/22/2011  . Medicare annual wellness visit, subsequent 07/22/2011  . Hypertension   . Hyperlipidemia   . Insomnia   . Colon polyps     Orientation RESPIRATION BLADDER Height & Weight     Self, Time, Situation, Place  Normal Continent Weight: 83.7 kg Height:  5\' 6"  (167.6 cm)  BEHAVIORAL SYMPTOMS/MOOD NEUROLOGICAL BOWEL NUTRITION STATUS      Continent Diet(regular heart healthy)  AMBULATORY STATUS  COMMUNICATION OF NEEDS Skin   Limited Assist Verbally Other (Comment)(moisture skin damage buttock, groin area)                       Personal Care Assistance Level of Assistance  Bathing, Feeding, Dressing Bathing Assistance: Limited assistance Feeding assistance: Independent Dressing Assistance: Limited assistance     Functional Limitations Info  Sight, Hearing, Speech Sight Info: Adequate Hearing Info: Adequate Speech Info: Adequate    SPECIAL CARE FACTORS FREQUENCY                       Contractures Contractures Info: Not present    Additional Factors Info  Code Status, Allergies Code Status Info: DNR Allergies Info: Lisinopril, Latex, Statins, Keflex Cephalexin, Losartan           Current Medications (02/17/2019):  This is the current hospital active medication list Current Facility-Administered Medications  Medication Dose Route Frequency Provider Last Rate Last Admin  . acetaminophen (TYLENOL) tablet 650 mg  650 mg Oral Q6H PRN Rise Patience, MD   650 mg at 02/11/19 1318   Or  . acetaminophen (TYLENOL) suppository 650 mg  650 mg Rectal Q6H PRN Rise Patience, MD      . amLODipine (NORVASC) tablet 10 mg  10 mg Oral Daily Wendee Beavers T, MD   10 mg at 02/17/19 1211  . benzonatate (TESSALON) capsule 100 mg  100 mg Oral TID PRN Antonieta Pert, MD   100 mg at 02/17/19 0550  . diphenhydrAMINE (BENADRYL) capsule 25  mg  25 mg Oral QHS PRN Hollace Hayward K, NP   25 mg at 02/12/19 2115  . donepezil (ARICEPT) tablet 5 mg  5 mg Oral QHS Rise Patience, MD   5 mg at 02/15/19 2101  . enoxaparin (LOVENOX) injection 40 mg  40 mg Subcutaneous Daily Lenis Noon, RPH   40 mg at 02/17/19 1212  . feeding supplement (ENSURE ENLIVE) (ENSURE ENLIVE) liquid 237 mL  237 mL Oral BID BM Gonfa, Taye T, MD   237 mL at 02/17/19 1212  . hydrALAZINE (APRESOLINE) tablet 25 mg  25 mg Oral Q6H PRN Wendee Beavers T, MD   25 mg at 02/11/19 1321  . insulin aspart (novoLOG)  injection 0-5 Units  0-5 Units Subcutaneous QHS Mercy Riding, MD   2 Units at 02/14/19 2102  . insulin aspart (novoLOG) injection 0-9 Units  0-9 Units Subcutaneous TID WC Mercy Riding, MD   2 Units at 02/17/19 1314  . liver oil-zinc oxide (DESITIN) 40 % ointment   Topical QID Wendee Beavers T, MD      . multivitamin with minerals tablet 1 tablet  1 tablet Oral Daily Mercy Riding, MD   1 tablet at 02/17/19 1211  . ondansetron (ZOFRAN) tablet 4 mg  4 mg Oral Q6H PRN Rise Patience, MD       Or  . ondansetron [facility]) injection 4 mg  4 mg Intravenous Q6H PRN Rise Patience, MD      . QUEtiapine (SEROQUEL) tablet 25 mg  25 mg Oral QHS Wendee Beavers T, MD   25 mg at 02/15/19 2101  . QUEtiapine (SEROQUEL) tablet 25 mg  25 mg Oral Daily PRN Mercy Riding, MD   25 mg at 02/16/19 0833  . sertraline (ZOLOFT) tablet 25 mg  25 mg Oral Daily Wendee Beavers T, MD   25 mg at 02/17/19 1211     Discharge Medications: Please see discharge summary for a list of discharge medications.  Relevant Imaging Results:  Relevant Lab Results:   Additional Information [id number]  Purcell Mouton, RN

## 2019-02-17 NOTE — TOC Progression Note (Signed)
Transition of Care [facility]) - Progression Note    Patient Details  Name: Taylor Vaughn MRN: [id number] Date of Birth: Jul 23, 1929  Transition of Care [facility]) CM/SW Contact  Purcell Mouton, RN Phone Number: 02/17/2019, 2:37 PM  Clinical Narrative:     Pt's Daughter [name] asked that Merry Proud at [facility] be called [phone]. Clinicals and FL2 faxed to [place] at [phone]. Merry Proud asked that pt's RN would call to make an appointment for virtual assessment to [place], [place]. Information given to CN.        Expected Discharge Plan and Services           Expected Discharge Date: 02/02/19                                     Social Determinants of Health (SDOH) Interventions    Readmission Risk Interventions No flowsheet data found.

## 2019-02-17 NOTE — Progress Notes (Signed)
Nutrition Follow-up  RD working remotely.   DOCUMENTATION CODES:   Not applicable  INTERVENTION:  - continue Magic Cup BID and Ensure Enlive BID. - will monitor for additional nutrition-related needs.    NUTRITION DIAGNOSIS:   Increased nutrient needs related to acute illness(COVID-19 positive patient) as evidenced by estimated needs. -ongoing  GOAL:   Patient will meet greater than or equal to 90% of their needs -met on average   MONITOR:   PO intake, Supplement acceptance, Labs, Weight trends  ASSESSMENT:   85 year old female with HTN, memory issues on Aricept. Patient presented to the ED with 2-3 day history of decreased activity and increased time spent in bed, and N/V/D. She resides at an independent living facility. In the ED, she was found to be COVID-19 positive. CXR and CT head were unremarkable.  Patient is a/o to self only. Per flow sheet documentation, she consumed 30% of lunch and 60% of dinner on 12/12; 100% of breakfast and lunch and 50% of dinner on 12/13; 50% of breakfast and lunch and 75% of dinner on 12/14. She has not been weighed since 11/30.  Per notes: - patient has been verbally and physically abuse toward staff at times - intermittently refusing care - acute respiratory failure with hypoxia due to COVID-19, PNA--improving - N/V/D--resolved - hx of dementia - AKI--stable - debility and deconditioning--PT recommends SNF   Labs reviewed; BUN: 42 mg/dl, GFR: 57 ml/min. Medications reviewed; sliding scale novolog, daily multivitamin with minerals.    Diet Order:   Diet Order            Diet regular Room service appropriate? Yes; Fluid consistency: Thin  Diet effective now        Diet - low sodium heart healthy              EDUCATION NEEDS:   No education needs have been identified at this time  Skin:  Skin Assessment: Reviewed RN Assessment  Last BM:  12/14  Height:   Ht Readings from Last 1 Encounters:  01/31/19 5' 6"  (1.676 m)     Weight:   Wt Readings from Last 1 Encounters:  02/01/19 83.7 kg    Ideal Body Weight:  59.1 kg  BMI:  Body mass index is 29.78 kg/m.  Estimated Nutritional Needs:   Kcal:  1750-1900 kcal  Protein:  80-95 grams  Fluid:  >/= 2 L/day     Jarome Matin, MS, RD, LDN, [facility] Inpatient Clinical Dietitian Pager # [phone] After hours/weekend pager # [phone]

## 2019-02-18 LAB — CBC
HCT: 48.2 % — ABNORMAL HIGH (ref 36.0–46.0)
Hemoglobin: 15.2 g/dL — ABNORMAL HIGH (ref 12.0–15.0)
MCH: 31.1 pg (ref 26.0–34.0)
MCHC: 31.5 g/dL (ref 30.0–36.0)
MCV: 98.8 fL (ref 80.0–100.0)
Platelets: 279 10*3/uL (ref 150–400)
RBC: 4.88 MIL/uL (ref 3.87–5.11)
RDW: 13.4 % (ref 11.5–15.5)
WBC: 17.3 10*3/uL — ABNORMAL HIGH (ref 4.0–10.5)
nRBC: 0 % (ref 0.0–0.2)

## 2019-02-18 LAB — FERRITIN: Ferritin: 1064 ng/mL — ABNORMAL HIGH (ref 11–307)

## 2019-02-18 LAB — GLUCOSE, CAPILLARY
Glucose-Capillary: 138 mg/dL — ABNORMAL HIGH (ref 70–99)
Glucose-Capillary: 178 mg/dL — ABNORMAL HIGH (ref 70–99)
Glucose-Capillary: 133 mg/dL — ABNORMAL HIGH (ref 70–99)
Glucose-Capillary: 132 mg/dL — ABNORMAL HIGH (ref 70–99)

## 2019-02-18 LAB — D-DIMER, QUANTITATIVE: D-Dimer, Quant: 0.71 ug/mL-FEU — ABNORMAL HIGH (ref 0.00–0.50)

## 2019-02-18 LAB — URIC ACID: Uric Acid, Serum: 4 mg/dL (ref 2.5–7.1)

## 2019-02-18 LAB — C-REACTIVE PROTEIN: CRP: 0.7 mg/dL (ref <1.0)

## 2019-02-18 MED ORDER — SODIUM CHLORIDE 0.9 % IV BOLUS
500.0000 mL | Freq: Once | INTRAVENOUS | Status: AC
  Administered 2019-02-18: 500 mL via INTRAVENOUS

## 2019-02-18 MED ORDER — SODIUM CHLORIDE 0.9 % IV BOLUS
500.0000 mL | Freq: Once | INTRAVENOUS | Status: AC
  Administered 2019-02-18: 06:00:00 500 mL via INTRAVENOUS

## 2019-02-18 NOTE — Progress Notes (Signed)
RN spoke with both daughter and son and updated on patient's status. Family requests that they are to be called prior to any virtual assessments with possible accepting facilities. Will pass along in report to oncoming nurse tonight.

## 2019-02-18 NOTE — TOC Progression Note (Addendum)
Transition of Care [facility]) - Progression Note    Patient Details  Name: Taylor Vaughn MRN: [id number] Date of Birth: 24-Oct-1929  Transition of Care [facility]) CM/SW Contact  Purcell Mouton, RN Phone Number: 02/18/2019, 2:24 PM  Clinical Narrative:    Pt's daughter [name] called and asked that clinical be faxed to [facility]. [phone] fax. IM was faxed to pt's son Abe People [phone]. Daughter [name] is aware of IM faxed to [place].          Expected Discharge Plan and Services           Expected Discharge Date: 02/02/19                                     Social Determinants of Health (SDOH) Interventions    Readmission Risk Interventions No flowsheet data found.

## 2019-02-18 NOTE — TOC Progression Note (Addendum)
Transition of Care [facility]) - Progression Note    Patient Details  Name: Taylor Vaughn MRN: [id number] Date of Birth: [birth year]  Transition of Care [facility]) CM/SW Contact  Purcell Mouton, RN Phone Number: 02/18/2019, 2:34 PM  Clinical Narrative:    Son Abe People called to ask for RN to call him so that he may speak with pt before virtual visit with ALF/SNF rep.         Expected Discharge Plan and Services           Expected Discharge Date: 02/02/19                                     Social Determinants of Health (SDOH) Interventions    Readmission Risk Interventions No flowsheet data found.

## 2019-02-18 NOTE — Progress Notes (Signed)
RN notified Bodenheimer, NP that the patient has had 1 unmeasured urine occurrence at 2139 and only 165mL out since then. RN will continue to monitor.

## 2019-02-18 NOTE — TOC Progression Note (Signed)
Transition of Care [facility]) - Progression Note    Patient Details  Name: Taylor Vaughn MRN: [id number] Date of Birth: September 29, 1929  Transition of Care [facility]) CM/SW Contact  Purcell Mouton, RN Phone Number: 02/18/2019, 11:30 AM  Clinical Narrative:    A call was made to pt's son Alwyn Pea [phone] to speak about pt's discharge concerns. Abe People feels that pt is not ready for discharge, with these concerns:pt coughing, no voice, has pneumonia increased, and if she is still COVID positive. Abe People states he is a Chief Executive Officer and can get nasty. Expressed to Abe People that this CM care for her pt and family. That this CM spoke nicely to his sister [name], who yelled at [facility], but continued to speak calmly to her(Carol). Abe People states that being nasty means taking legal actions. This CM stated hospital could call APS, the hospital has lawyers and would be glad to speak with him. Explained to [place] that we are not a long term acute facility. That the best place for his mother at this time is a SNF not ALF. Also explained to [place], a call was made to [facility] who states that it is a process, that are not sure a bed would be available. A list of [place] facilities was given to [facility], [place] [place] in [place]. This CM explained to [facility] that his concerns would be shared with MD and ask that he Abe People) be called.        Expected Discharge Plan and Services           Expected Discharge Date: 02/02/19                                     Social Determinants of Health (SDOH) Interventions    Readmission Risk Interventions No flowsheet data found.

## 2019-02-18 NOTE — TOC Progression Note (Signed)
Transition of Care [facility]) - Progression Note    Patient Details  Name: Taylor Vaughn MRN: [id number] Date of Birth: 10-11-1929  Transition of Care [facility]) CM/SW Contact  Purcell Mouton, RN Phone Number: 02/18/2019, 9:47 AM  Clinical Narrative:     Call pt's daughter this AM with no answer left VM.       Expected Discharge Plan and Services           Expected Discharge Date: 02/02/19                                     Social Determinants of Health (SDOH) Interventions    Readmission Risk Interventions No flowsheet data found.

## 2019-02-18 NOTE — Progress Notes (Signed)
PROGRESS NOTE  Taylor Vaughn [id number] DOB: 12/02/1929   PCP: Lujean Amel, MD  Patient is from: Independent living facility  DOA: 01/31/2019 LOS: 18  Brief Narrative / Interim history: 85 year old female with history of HTN, dementia, HLD and radiculopathy presenting with generalized weakness, nausea, vomiting and diarrhea for 2 to 3 days POA.  In ED, positive for COVID-19. CBC, CMP, CXR and CT head without acute finding. Patient was admitted for COVID-19 infection with associated GI symptoms. However, patient desaturated to 86% on RA later in the course requiring 2 L by [place] to maintain appropriate saturation. He was started on remdesivir and steroid per ID recommendation.  She is not stable from respiratory standpoint but physically deconditioned to return to independent living facility.  PT/OT recommended SNF.  However, patient's daughter turned down bed offers at two skilled nursing facilities, and asked for disposition to [facility]. RN from [facility].  Unfortunately, patient was not following directions at time of evaluation by RN from ALF on 11/16.  She remained nonverbal.  Patient's daughter was aware of this.    Subjective: No major events overnight of this morning.  She says she is terrible but not elaborate.  She responds no to pain, shortness of breath or GI symptoms but doesn't talk or interact much.  She does not appear to be in distress.  RN staff at bedside to help patient to commode.  Objective: Vitals:   02/17/19 2115 02/17/19 2116 02/18/19 0524 02/18/19 1013  BP: 109/77  139/80   Pulse: 98  73 95  Resp: 18     Temp: 98.2 F (36.8 C)  97.7 F (36.5 C)   TempSrc:   Oral   SpO2: 91% 95% 94% 94%  Weight:      Height:        Intake/Output Summary (Last 24 hours) at 02/18/2019 1152 Last data filed at 02/18/2019 1000 Gross per 24 hour  Intake 739.67 ml  Output 500 ml  Net 239.67 ml   Filed Weights   01/31/19 2051 02/01/19 0300  Weight: 81.6 kg 83.7  kg    Examination:  GENERAL: No apparent distress.  Nontoxic. HEENT: MMM.  Vision and hearing grossly intact.  NECK: Supple.  No apparent JVD.  RESP:  No IWOB.  Fair aeration bilaterally. CVS:  RRR. Heart sounds normal.  ABD/GI/GU: Bowel sounds present. Soft.  RLQ tenderness but inconsistent.  No rebound or guarding. MSK/EXT:  Moves extremities. No apparent deformity or edema.  No tenderness to palpation of the right knee today. SKIN: no apparent skin lesion or wound NEURO: Awake, alert and oriented to self.  No apparent focal neuro deficit. PSYCH: Quite  Procedures:  None  Assessment & Plan: Acute respiratory failure with hypoxia due to COVID-19 infection/pneumonia: -Saturating well on room air.  No respiratory distress. -Inflammatory markers downtrending.  Pro-Cal negative. -Remdesivir from 12/8-12/12.  Decadron from 12/8-12/14 -subcu Lovenox for VTE prophylaxis -Supportive care with bronchodilators, mucolytic's, antitussives, vitamins and incentive spirometry. -OOB/PT/OT-not motivated. Recent Labs    02/16/19 0449 02/17/19 0053 02/18/19 0359  DDIMER 1.17* 1.07* 0.71*  FERRITIN 977* 1,108* 1,064*  CRP 0.6 0.6 0.7   Nausea/vomiting/diarrhea: Likely due to the above.  Resolved. -Supportive care  Dementia with behavioral disturbance: Intermittent agitation and refusal of care.  -Frequent reorientation and delirium precautions. -Increased nightly Seroquel to 50 mg daily with 25 mg as needed agitation -Continue Zoloft 25 mg 12/13 >>  Essential hypertension: Normotensive -Continue amlodipine 10 mg daily -Hydralazine 25 mg every 6  hours PRN SBP above 160 or DBP above 100.  First-degree AV block: Could be due to Aricept.  No inpatient work-up needed per cardiology -Discontinue Aricept if symptomatic or persistent bradycardia.  Leukocytosis/bandemia: suspect this to be due to steroid.  Improved. -Continue monitoring  Right knee pain/tenderness: Seems to have resolved.   Not tender with palpation on exam today.  Right knee x-ray without significant finding.  Uric acid within normal.  Leukocytosis improving. -As needed pain medication  Mild hyperkalemia: Resolved. -Recheck as needed  AKI/azotemia: Resolving. -Continue monitoring  Debility/physical deconditioning/FTT/generalized weakness: patient is from independent living facility.  Daughter concerned about patient's debility and ability to return to ILF.  PT recommended SNF but family declined SNF. -Encourage OOB and PT/OT  Noncompliance with care-refusing lab draws and medications at times.  Dementia and possible underlying depression contributing.  -Zoloft and Seroquel as above.  Left upper extremity edema: Likely due to PIV.  Resolved.    Nutrition Problem: Increased nutrient needs Etiology: acute illness(COVID-19 positive patient)  Signs/Symptoms: estimated needs  Interventions: MVI, Magic cup, Ensure Enlive (each supplement provides 350kcal and 20 grams of protein)   DVT prophylaxis: Subcu Lovenox Code Status: DNR/DNI Family Communication: Updated patient's son over the phone.  Disposition Plan: Patient is from an ILF but to weak and deconditioned.  PT recommended SNF.  Family turned down skilled nursing facilities and requested ALF.  Was not cooperative during virtual evaluation by RN from the ALF identified by the family. Consultants: Cardiology (off)   Microbiology summarized: 12/6-COVID-19 positive. 11/29-blood cultures negative. 11/30-MRSA PCR negative.  Sch Meds:  Scheduled Meds: . amLODipine  10 mg Oral Daily  . dextromethorphan-guaiFENesin  1 tablet Oral BID  . donepezil  5 mg Oral QHS  . enoxaparin (LOVENOX) injection  40 mg Subcutaneous Daily  . feeding supplement (ENSURE ENLIVE)  237 mL Oral BID BM  . insulin aspart  0-5 Units Subcutaneous QHS  . insulin aspart  0-9 Units Subcutaneous TID WC  . liver oil-zinc oxide   Topical QID  . multivitamin with minerals  1 tablet  Oral Daily  . QUEtiapine  50 mg Oral QHS  . sertraline  25 mg Oral Daily   Continuous Infusions:  PRN Meds:.acetaminophen **OR** acetaminophen, diphenhydrAMINE, hydrALAZINE, ondansetron **OR** ondansetron (ZOFRAN) IV, QUEtiapine  Antimicrobials: Anti-infectives (From admission, onward)   Start     Dose/Rate Route Frequency Ordered Stop   02/08/19 1000  remdesivir 100 mg in sodium chloride 0.9 % 100 mL IVPB     100 mg 200 mL/hr over 30 Minutes Intravenous Daily 02/07/19 1745 02/11/19 1113   02/07/19 1800  remdesivir 100 mg in sodium chloride 0.9 % 100 mL IVPB  Status:  Discontinued     100 mg 200 mL/hr over 30 Minutes Intravenous Daily 02/07/19 1719 02/07/19 1745   02/07/19 1800  remdesivir 200 mg in sodium chloride 0.9% 250 mL IVPB     200 mg 580 mL/hr over 30 Minutes Intravenous Once 02/07/19 1745 02/07/19 1911   02/02/19 1000  remdesivir 100 mg in sodium chloride 0.9 % 250 mL IVPB  Status:  Discontinued     100 mg 500 mL/hr over 30 Minutes Intravenous Every 24 hours 02/01/19 0258 02/03/19 1713   02/01/19 0230  remdesivir 200 mg in sodium chloride 0.9 % 250 mL IVPB     200 mg 500 mL/hr over 30 Minutes Intravenous Once 02/01/19 0151 02/01/19 0935       I have personally reviewed the following labs and images: CBC:  Recent Labs  Lab 02/12/19 1053 02/13/19 1131 02/15/19 0104 02/17/19 0053 02/18/19 0359  WBC 20.9* 25.1* 21.0* 23.8* 17.3*  NEUTROABS 17.1* 20.6* 18.5* 19.6*  --   HGB 15.2* 15.7* 15.8* 15.7* 15.2*  HCT 46.7* 46.8* 48.6* 48.0* 48.2*  MCV 95.5 95.1 95.1 94.5 98.8  PLT 375 403* 387 357 279   BMP &GFR Recent Labs  Lab 02/12/19 1053 02/13/19 1131 02/14/19 0906 02/15/19 0104 02/15/19 0910 02/16/19 0449 02/17/19 0053  NA 139  --  139  --  137 134* 136  K 4.4  --  5.3*  --  4.9 4.9 4.8  CL 106  --  102  --  99 100 104  CO2 26  --  29  --  26 25 27   GLUCOSE 212*  --  140*  --  160* 250* 158*  BUN 32*  --  43*  --  46* 40* 42*  CREATININE 0.87  --  0.94   --  0.91 1.01* 0.91  CALCIUM 9.8  --  10.0  --  10.2 10.0 10.0  MG  --  2.2 2.5* 2.4  --  2.3  --    Estimated Creatinine Clearance: 47.5 mL/min (by C-G formula based on SCr of 0.91 mg/dL). Liver & Pancreas: Recent Labs  Lab 02/12/19 1053 02/17/19 0053  AST 20 17  ALT 31 34  ALKPHOS 48 45  BILITOT 0.8 0.9  PROT 5.6* 5.7*  ALBUMIN 2.5* 2.5*   No results for input(s): LIPASE, AMYLASE in the last 168 hours. No results for input(s): AMMONIA in the last 168 hours. Diabetic: No results for input(s): HGBA1C in the last 72 hours. Recent Labs  Lab 02/17/19 0907 02/17/19 1134 02/17/19 1654 02/17/19 2118 02/18/19 0752  GLUCAP 176* 161* 120* 139* 138*   Cardiac Enzymes: No results for input(s): CKTOTAL, CKMB, CKMBINDEX, TROPONINI in the last 168 hours. No results for input(s): PROBNP in the last 8760 hours. Coagulation Profile: No results for input(s): INR, PROTIME in the last 168 hours. Thyroid Function Tests: No results for input(s): TSH, T4TOTAL, FREET4, T3FREE, THYROIDAB in the last 72 hours. Lipid Profile: No results for input(s): CHOL, HDL, LDLCALC, TRIG, CHOLHDL, LDLDIRECT in the last 72 hours. Anemia Panel: Recent Labs    02/17/19 0053 02/18/19 0359  FERRITIN 1,108* 1,064*   Urine analysis:    Component Value Date/Time   COLORURINE YELLOW 02/02/2019 2043   APPEARANCEUR HAZY (A) 02/02/2019 2043   LABSPEC 1.012 02/02/2019 2043   PHURINE 6.0 02/02/2019 2043   GLUCOSEU NEGATIVE 02/02/2019 2043   GLUCOSEU NEGATIVE 11/15/2013 1106   [place] 02/02/2019 2043   BILIRUBINUR NEGATIVE 02/02/2019 2043   BILIRUBINUR neg 12/31/2011 1355   [place] 02/02/2019 2043   PROTEINUR NEGATIVE 02/02/2019 2043   UROBILINOGEN 0.2 11/15/2013 1106   NITRITE NEGATIVE 02/02/2019 2043   LEUKOCYTESUR NEGATIVE 02/02/2019 2043   Sepsis Labs: Invalid input(s): PROCALCITONIN, [place]  Microbiology: No results found for this or any previous visit (from the past 240  hour(s)).  Radiology Studies: No results found.   Taye T. Iredell  If 7PM-7AM, please contact night-coverage [email or web address] Password [email or web address] 02/18/2019, 11:52 AM

## 2019-02-18 NOTE — Progress Notes (Signed)
Called daughter [name] while in pt room. Pt refusing to speak at first but became slightly agreeable shortly after.   Daughter expressing concerns had at this time. This RN to answer all questions and concerns to best of ability. Will relay to oncoming nurse. . See previous notes as well.   Pt wishing to get some rest at this time, no further concerns were expressed following such.  Phone call was ended.    Will continue to assess and monitor pt.

## 2019-02-18 NOTE — Progress Notes (Signed)
PT noted to only void 50 mL. Bladder scan pt with only 167mL noted. MD made aware. No further orders at this time.

## 2019-02-19 DIAGNOSIS — F321 Major depressive disorder, single episode, moderate: Secondary | ICD-10-CM

## 2019-02-19 LAB — C-REACTIVE PROTEIN: CRP: 2.1 mg/dL — ABNORMAL HIGH (ref <1.0)

## 2019-02-19 LAB — CBC
HCT: 43.3 % (ref 36.0–46.0)
Hemoglobin: 13.8 g/dL (ref 12.0–15.0)
MCH: 30.9 pg (ref 26.0–34.0)
MCHC: 31.9 g/dL (ref 30.0–36.0)
MCV: 97.1 fL (ref 80.0–100.0)
Platelets: 280 10*3/uL (ref 150–400)
RBC: 4.46 MIL/uL (ref 3.87–5.11)
RDW: 13.2 % (ref 11.5–15.5)
WBC: 28.6 10*3/uL — ABNORMAL HIGH (ref 4.0–10.5)
nRBC: 0 % (ref 0.0–0.2)

## 2019-02-19 LAB — COMPREHENSIVE METABOLIC PANEL
ALT: 32 U/L (ref 0–44)
AST: 19 U/L (ref 15–41)
Albumin: 2.2 g/dL — ABNORMAL LOW (ref 3.5–5.0)
Alkaline Phosphatase: 45 U/L (ref 38–126)
Anion gap: 6 (ref 5–15)
BUN: 35 mg/dL — ABNORMAL HIGH (ref 8–23)
CO2: 27 mmol/L (ref 22–32)
Calcium: 9.4 mg/dL (ref 8.9–10.3)
Chloride: 104 mmol/L (ref 98–111)
Creatinine, Ser: 0.82 mg/dL (ref 0.44–1.00)
GFR calc Af Amer: >60 mL/min (ref >60)
GFR calc non Af Amer: >60 mL/min (ref >60)
Glucose, Bld: 140 mg/dL — ABNORMAL HIGH (ref 70–99)
Potassium: 4.3 mmol/L (ref 3.5–5.1)
Sodium: 137 mmol/L (ref 135–145)
Total Bilirubin: 0.7 mg/dL (ref 0.3–1.2)
Total Protein: 5.1 g/dL — ABNORMAL LOW (ref 6.5–8.1)

## 2019-02-19 LAB — D-DIMER, QUANTITATIVE: D-Dimer, Quant: 0.66 ug/mL-FEU — ABNORMAL HIGH (ref 0.00–0.50)

## 2019-02-19 LAB — GLUCOSE, CAPILLARY
Glucose-Capillary: 113 mg/dL — ABNORMAL HIGH (ref 70–99)
Glucose-Capillary: 126 mg/dL — ABNORMAL HIGH (ref 70–99)
Glucose-Capillary: 159 mg/dL — ABNORMAL HIGH (ref 70–99)

## 2019-02-19 LAB — FERRITIN: Ferritin: 1015 ng/mL — ABNORMAL HIGH (ref 11–307)

## 2019-02-19 MED ORDER — QUETIAPINE FUMARATE 25 MG PO TABS
25.0000 mg | ORAL_TABLET | Freq: Every day | ORAL | Status: DC
  Administered 2019-02-19: 25 mg via ORAL
  Filled 2019-02-19: qty 1

## 2019-02-19 NOTE — Plan of Care (Addendum)
Pt refusing to interact w/ staff. Pt refused meds, so daughter was called. Pt refused to speak to her. Son was called, but there was no answer, so a message was left for him. Pt just keeps her eyes closed and only screams if staff repositions her, or touches her in any way. She is verbally abusive to staff when she decides to speak.  No urine OP for pt since it was last recorded @ 2227 (350). Pt refusing intake of any kind. Bladder scan shows 220@ 0600  Pt's HR stayed mostly in the mid to upper 60's, however it did drop to 32 at least twice during night shift.  Pt is hoarse from screaming at staff and has a mild dry cough. Pt was offered cough syrup, but declined.

## 2019-02-19 NOTE — Progress Notes (Signed)
PROGRESS NOTE  Taylor Vaughn [id number] DOB: [birth year]   PCP: Darrow Bussing, MD  Patient is from: Independent living facility  DOA: 01/31/2019 LOS: 19  Brief Narrative / Interim history: 85 year old female with history of HTN, dementia, HLD and radiculopathy presenting with generalized weakness, nausea, vomiting and diarrhea for 2 to 3 days POA.  In ED, positive for COVID-19. CBC, CMP, CXR and CT head without acute finding. Patient was admitted for COVID-19 infection with associated GI symptoms. However, patient desaturated to 86% on RA later in the course requiring 2 L by [place] to maintain appropriate saturation. He was started on remdesivir and steroid per ID recommendation.  She is not stable from respiratory standpoint but physically deconditioned to return to independent living facility.  PT/OT recommended SNF.  However, patient's daughter turned down bed offers at two skilled nursing facilities, and asked for disposition to [facility]. RN from [facility].  Unfortunately, patient was not following directions at time of evaluation by RN from ALF on 11/16.  TOC and family working on placement.   Subjective: Patient agitated and screaming out clinical staff asking to leave her alone so that she can sleep.  Also refusing to eat and drink.  Minimally interactive with me this morning.  She responds no to pain, trouble breathing, nausea, vomiting or abdominal pain.  Oriented to self and place. She responded to "leave me alone" during further mental status assessment.  Does not appear to be in distress.  Objective: Vitals:   02/18/19 1013 02/18/19 1229 02/18/19 2200 02/19/19 0543  BP:  128/69 (!) 147/83 (!) 142/86  Pulse: 95 79 70 63  Resp:  20 (!) 22 20  Temp:  97.9 F (36.6 C) 98.2 F (36.8 C) (!) 97.5 F (36.4 C)  TempSrc:  Axillary Oral Oral  SpO2: 94% 94% 92% 94%  Weight:      Height:        Intake/Output Summary (Last 24 hours) at 02/19/2019 1503 Last data filed at  02/19/2019 1230 Gross per 24 hour  Intake 129.92 ml  Output 450 ml  Net -320.08 ml   Filed Weights   01/31/19 2051 02/01/19 0300  Weight: 81.6 kg 83.7 kg    Examination:  GENERAL: No apparent distress.  Nontoxic. HEENT: MMM.  Vision and hearing grossly intact.  NECK: Supple.  No apparent JVD.  RESP:  No IWOB.  Fair aeration bilaterally. CVS:  RRR. Heart sounds normal.  ABD/GI/GU: Bowel sounds present. Soft. Non tender.  MSK/EXT:  Moves extremities. No apparent deformity or edema.  SKIN: no apparent skin lesion or wound NEURO: Sleepy but awakes to voice easily.  Oriented x2.  Does not feel like talking.  No apparent focal neuro deficit. PSYCH: Sleepy but wakes to voice easily.  Does not feel like talking.  Procedures:  None  Assessment & Plan: Acute respiratory failure with hypoxia due to COVID-19 infection/pneumonia: -Saturating well on room air.  No respiratory distress. -Pro-Cal negative.  CRP after 2.1 today.  Leukocytosis worse. -Remdesivir from 12/8-12/12.  Decadron from 12/8-12/14. -subcu Lovenox for VTE prophylaxis -Supportive care with bronchodilators, mucolytic's, antitussives, vitamins and incentive spirometry. -OOB/PT/OT-not motivated to work with therapy Recent Labs    02/17/19 0053 02/18/19 0359 02/19/19 0055  DDIMER 1.07* 0.71* 0.66*  FERRITIN 1,108* 1,064* 1,015*  CRP 0.6 0.7 2.1*   Dementia with behavioral disturbance and major depression: patient agitated and refusing cares.  Poor p.o. intake.  Some passive suicidal thoughts but denies intent.  TFT within normal range. -Continue  Zoloft 25 mg daily 12/13>> -Reduce nightly Seroquel to 25 mg and discontinue the as needed -Continue reorientation and delirium precautions -We consult psych.  Debility/physical deconditioning/FTT/generalized weakness: patient is from independent living facility.  Daughter concerned about patient's debility and ability to return to ILF.  PT recommended SNF but family  initially declined SNF.  TOC on board. -Encourage OOB and PT/OT Nausea/vomiting/diarrhea: Likely due to the above.  Resolved. -Supportive care  Leukocytosis/bandemia: Post steroid?  No obvious source of infection other than known COVID-19. -Continue monitoring  Essential hypertension: Normotensive -Continue amlodipine 10 mg daily -Hydralazine 25 mg every 6 hours PRN SBP above 160 or DBP above 100.  First-degree AV block: Could be due to Aricept.  No inpatient work-up needed per cardiology -Discontinue Aricept if symptomatic or persistent bradycardia.  Right knee pain/tenderness: Seems to have resolved.  No further pain or tenderness.  Right knee x-ray without significant finding.  Uric acid within normal.  Mild hyperkalemia: Resolved. -Recheck as needed  AKI/azotemia: AKI resolved.  Azotemia improved. -Continue monitoring intermittently  Noncompliance with care-refusing care at times.   Left upper extremity edema: Likely due to PIV.  Resolved.    Nutrition Problem: Increased nutrient needs Etiology: acute illness(COVID-19 positive patient)  Signs/Symptoms: estimated needs  Interventions: MVI, Magic cup, Ensure Enlive (each supplement provides 350kcal and 20 grams of protein)   DVT prophylaxis: Subcu Lovenox Code Status: DNR/DNI Family Communication: Updated patient's son over the phone.  Disposition Plan: Patient is from an ILF but to weak and deconditioned.  PT recommended SNF.  Family turned down SNF and requested ALF.  Was not cooperative during virtual evaluation by RN from the ALF identified by the family.  Now family reconsidering SNF.  TOC on board. Consultants: Cardiology (off), psychiatry   Microbiology summarized: 12/6-COVID-19 positive. 11/29-blood cultures negative. 11/30-MRSA PCR negative.  Sch Meds:  Scheduled Meds: . amLODipine  10 mg Oral Daily  . dextromethorphan-guaiFENesin  1 tablet Oral BID  . donepezil  5 mg Oral QHS  . enoxaparin (LOVENOX)  injection  40 mg Subcutaneous Daily  . feeding supplement (ENSURE ENLIVE)  237 mL Oral BID BM  . insulin aspart  0-5 Units Subcutaneous QHS  . insulin aspart  0-9 Units Subcutaneous TID WC  . liver oil-zinc oxide   Topical QID  . multivitamin with minerals  1 tablet Oral Daily  . QUEtiapine  25 mg Oral QHS  . sertraline  25 mg Oral Daily   Continuous Infusions:  PRN Meds:.acetaminophen **OR** acetaminophen, diphenhydrAMINE, hydrALAZINE, ondansetron **OR** ondansetron (ZOFRAN) IV, QUEtiapine  Antimicrobials: Anti-infectives (From admission, onward)   Start     Dose/Rate Route Frequency Ordered Stop   02/08/19 1000  remdesivir 100 mg in sodium chloride 0.9 % 100 mL IVPB     100 mg 200 mL/hr over 30 Minutes Intravenous Daily 02/07/19 1745 02/11/19 1113   02/07/19 1800  remdesivir 100 mg in sodium chloride 0.9 % 100 mL IVPB  Status:  Discontinued     100 mg 200 mL/hr over 30 Minutes Intravenous Daily 02/07/19 1719 02/07/19 1745   02/07/19 1800  remdesivir 200 mg in sodium chloride 0.9% 250 mL IVPB     200 mg 580 mL/hr over 30 Minutes Intravenous Once 02/07/19 1745 02/07/19 1911   02/02/19 1000  remdesivir 100 mg in sodium chloride 0.9 % 250 mL IVPB  Status:  Discontinued     100 mg 500 mL/hr over 30 Minutes Intravenous Every 24 hours 02/01/19 0258 02/03/19 1713   02/01/19 0230  remdesivir 200 mg in sodium chloride 0.9 % 250 mL IVPB     200 mg 500 mL/hr over 30 Minutes Intravenous Once 02/01/19 0151 02/01/19 0935       I have personally reviewed the following labs and images: CBC: Recent Labs  Lab 02/13/19 1131 02/15/19 0104 02/17/19 0053 02/18/19 0359 02/19/19 0055  WBC 25.1* 21.0* 23.8* 17.3* 28.6*  NEUTROABS 20.6* 18.5* 19.6*  --   --   HGB 15.7* 15.8* 15.7* 15.2* 13.8  HCT 46.8* 48.6* 48.0* 48.2* 43.3  MCV 95.1 95.1 94.5 98.8 97.1  PLT 403* 387 357 279 280   BMP &GFR Recent Labs  Lab 02/13/19 1131 02/14/19 0906 02/15/19 0104 02/15/19 0910 02/16/19 0449  02/17/19 0053 02/19/19 0055  NA  --  139  --  137 134* 136 137  K  --  5.3*  --  4.9 4.9 4.8 4.3  CL  --  102  --  99 100 104 104  CO2  --  29  --  26 25 27 27   GLUCOSE  --  140*  --  160* 250* 158* 140*  BUN  --  43*  --  46* 40* 42* 35*  CREATININE  --  0.94  --  0.91 1.01* 0.91 0.82  CALCIUM  --  10.0  --  10.2 10.0 10.0 9.4  MG 2.2 2.5* 2.4  --  2.3  --   --    Estimated Creatinine Clearance: 52.7 mL/min (by C-G formula based on SCr of 0.82 mg/dL). Liver & Pancreas: Recent Labs  Lab 02/17/19 0053 02/19/19 0055  AST 17 19  ALT 34 32  ALKPHOS 45 45  BILITOT 0.9 0.7  PROT 5.7* 5.1*  ALBUMIN 2.5* 2.2*   No results for input(s): LIPASE, AMYLASE in the last 168 hours. No results for input(s): AMMONIA in the last 168 hours. Diabetic: No results for input(s): HGBA1C in the last 72 hours. Recent Labs  Lab 02/18/19 1225 02/18/19 1641 02/18/19 2153 02/19/19 0738 02/19/19 1246  GLUCAP 178* 133* 132* 113* 126*   Cardiac Enzymes: No results for input(s): CKTOTAL, CKMB, CKMBINDEX, TROPONINI in the last 168 hours. No results for input(s): PROBNP in the last 8760 hours. Coagulation Profile: No results for input(s): INR, PROTIME in the last 168 hours. Thyroid Function Tests: No results for input(s): TSH, T4TOTAL, FREET4, T3FREE, THYROIDAB in the last 72 hours. Lipid Profile: No results for input(s): CHOL, HDL, LDLCALC, TRIG, CHOLHDL, LDLDIRECT in the last 72 hours. Anemia Panel: Recent Labs    02/18/19 0359 02/19/19 0055  FERRITIN 1,064* 1,015*   Urine analysis:    Component Value Date/Time   COLORURINE YELLOW 02/02/2019 2043   APPEARANCEUR HAZY (A) 02/02/2019 2043   LABSPEC 1.012 02/02/2019 2043   PHURINE 6.0 02/02/2019 2043   GLUCOSEU NEGATIVE 02/02/2019 2043   GLUCOSEU NEGATIVE 11/15/2013 1106   HGBUR NEGATIVE 02/02/2019 2043   BILIRUBINUR NEGATIVE 02/02/2019 2043   BILIRUBINUR neg 12/31/2011 1355   KETONESUR NEGATIVE 02/02/2019 2043   PROTEINUR NEGATIVE  02/02/2019 2043   UROBILINOGEN 0.2 11/15/2013 1106   NITRITE NEGATIVE 02/02/2019 2043   LEUKOCYTESUR NEGATIVE 02/02/2019 2043   Sepsis Labs: Invalid input(s): PROCALCITONIN, LACTICIDVEN  Microbiology: No results found for this or any previous visit (from the past 240 hour(s)).  Radiology Studies: No results found.   Kenric Ginger T. Eddie Koc [facility]  If 7PM-7AM, please contact night-coverage [email or web address] Password [email or web address] 02/19/2019, 3:03 PM

## 2019-02-19 NOTE — Progress Notes (Signed)
RN rounded on pt during hourly rounding. Pt cursing at nursing staff, stating" you bitch you woke me up. Get out of my room now". Pt resting in bed. Will continue to round on pt.

## 2019-02-19 NOTE — TOC Progression Note (Signed)
Transition of Care [facility]) - Progression Note    Patient Details  Name: Emry Michelena MRN: [id number] Date of Birth: [birth year]  Transition of Care [facility] CM/SW Contact  Purcell Mouton, RN Phone Number: 02/19/2019, 5:13 PM  Clinical Narrative:    [facility] called to cask for clinicals on pt. Which was faxed to [phone].       Expected Discharge Plan and Services           Expected Discharge Date: 02/02/19                                     Social Determinants of Health (SDOH) Interventions    Readmission Risk Interventions No flowsheet data found.

## 2019-02-19 NOTE — TOC Progression Note (Signed)
Transition of Care [facility]) - Progression Note    Patient Details  Name: Taylor Vaughn MRN: [id number] Date of Birth: August 19, 1929  [facility]) CM/SW Contact  Purcell Mouton, RN Phone Number: 02/19/2019, 4:39 PM  Clinical Narrative:    [facility] rep called facility is unable to take pt at this time and recommend SNF. This CM called pt's son Abe People to inform him of this information. Abe People will talk with his sister concerning SNF.         Expected Discharge Plan and Services           Expected Discharge Date: 02/02/19                                     Social Determinants of Health (SDOH) Interventions    Readmission Risk Interventions No flowsheet data found.

## 2019-02-19 NOTE — Progress Notes (Signed)
PT Cancellation Note  Patient Details Name: Taylor Vaughn MRN: [id number] DOB: 12/10/1929   Cancelled Treatment:    Reason Eval/Treat Not Completed: Patient declined, no reason specified. Checked in with nursing prior to treatment, pt continues to yell at all staff that enter room, using curse words, and not ceasing until they agree to leave. Therapist entered room and turned light on to introduce self, pt got agitated and began yelling with hoarse voice "shut the light" repeatedly, despite therapist trying to introduce self, so turned off light. Therapist attempted to educate pt on therapy, benefit of getting stronger, pt maintained eyes closed repeating "let me die". Therapist tried to encourage pt to participate in therapy, referenced family's desire for her to get better, but pt continues yelling "let me die". Therapist verbally agreed to leave room and pt stopped yelling. RN notified of encounter.   Tori Rawson [facility] PT, DPT 02/19/19, 12:07 PM [phone]

## 2019-02-19 NOTE — Progress Notes (Signed)
During morning assessment, RN was working with pt IV. Pt started screaming at nursing staff, "You woke me up you, bitch." RN called for NT to help with turning pt and bladder scan. Pt began screaming and cursing at staff without even touching pt. Pt refusing all care this morning. Will not drink, eat or interact with nursing staff. Pt made comfortable in bed and RN checked on pt.

## 2019-02-20 DIAGNOSIS — F03918 Unspecified dementia, unspecified severity, with other behavioral disturbance: Secondary | ICD-10-CM

## 2019-02-20 DIAGNOSIS — D72825 Bandemia: Secondary | ICD-10-CM

## 2019-02-20 DIAGNOSIS — F0391 Unspecified dementia with behavioral disturbance: Secondary | ICD-10-CM

## 2019-02-20 LAB — CBC
HCT: 39.9 % (ref 36.0–46.0)
Hemoglobin: 13 g/dL (ref 12.0–15.0)
MCH: 31.6 pg (ref 26.0–34.0)
MCHC: 32.6 g/dL (ref 30.0–36.0)
MCV: 97.1 fL (ref 80.0–100.0)
Platelets: 171 10*3/uL (ref 150–400)
RBC: 4.11 MIL/uL (ref 3.87–5.11)
RDW: 13.3 % (ref 11.5–15.5)
WBC: 20.9 10*3/uL — ABNORMAL HIGH (ref 4.0–10.5)
nRBC: 0 % (ref 0.0–0.2)

## 2019-02-20 LAB — GLUCOSE, CAPILLARY
Glucose-Capillary: 102 mg/dL — ABNORMAL HIGH (ref 70–99)
Glucose-Capillary: 143 mg/dL — ABNORMAL HIGH (ref 70–99)
Glucose-Capillary: 100 mg/dL — ABNORMAL HIGH (ref 70–99)
Glucose-Capillary: 115 mg/dL — ABNORMAL HIGH (ref 70–99)

## 2019-02-20 LAB — C-REACTIVE PROTEIN: CRP: 3.3 mg/dL — ABNORMAL HIGH (ref <1.0)

## 2019-02-20 MED ORDER — QUETIAPINE FUMARATE 50 MG PO TABS
50.0000 mg | ORAL_TABLET | Freq: Every day | ORAL | Status: DC
  Administered 2019-02-20 – 2019-02-22 (×3): 50 mg via ORAL
  Filled 2019-02-20 (×3): qty 1

## 2019-02-20 MED ORDER — METOPROLOL TARTRATE 25 MG PO TABS
12.5000 mg | ORAL_TABLET | Freq: Two times a day (BID) | ORAL | Status: DC
  Administered 2019-02-20 – 2019-02-22 (×5): 12.5 mg via ORAL
  Filled 2019-02-20 (×5): qty 1

## 2019-02-20 MED ORDER — ESCITALOPRAM OXALATE 10 MG PO TABS
10.0000 mg | ORAL_TABLET | Freq: Every day | ORAL | Status: DC
  Administered 2019-02-20 – 2019-02-22 (×3): 10 mg via ORAL
  Filled 2019-02-20 (×3): qty 1

## 2019-02-20 MED ORDER — APIXABAN 5 MG PO TABS
5.0000 mg | ORAL_TABLET | Freq: Two times a day (BID) | ORAL | Status: DC
  Administered 2019-02-20 – 2019-02-22 (×5): 5 mg via ORAL
  Filled 2019-02-20 (×5): qty 1

## 2019-02-20 MED ORDER — GUAIFENESIN-DM 100-10 MG/5ML PO SYRP: 5.0000 mL | ORAL_SOLUTION | ORAL | Status: DC | PRN

## 2019-02-20 NOTE — Progress Notes (Signed)
Notified by CCMD that patient had converted to atrial fib, rate in the 60's and 70's.  AF not noted in any of her history.  Attempted to go into room to evaluate patient with vitals and EKG, patient yelled at nurse to "leave me be".  Will make second attempt when nurse tech available to assist.

## 2019-02-20 NOTE — Consult Note (Signed)
Telepsych Consultation   Reason for Consult:  ''major depression with intermittent agitation and refusal of care'' Referring Physician:  Wendee Beavers, MD Location of Patient: [id number] Location of Provider: [facility]  Patient Identification: Taylor Vaughn MRN:  [id number] Principal Diagnosis: COVID-19 virus infection Diagnosis:  Principal Problem:   COVID-19 virus infection Active Problems:   Hypertension   SIRS (systemic inflammatory response syndrome) ([place])   Dementia with behavioral disturbance ([place])   Total Time spent with patient: 30 minutes  Subjective:   Taylor Vaughn is a 85 y.o. female patient admitted for Covid-19 infection with associated GI symptoms.  HPI:  Patient refused psychiatric evaluation,yelling, cursing and got agitated. Information was obtained through chart review. Per chart, patient is  85 year old female with history of HTN, dementia, depression, HLD and radiculopathy who was admitted for Covid-19 infection with associated GI symptoms.  Past Psychiatric History: depression  Risk to Self:  unable to assess due to patient being uncooperative Risk to Others:   unable to assess due to patient being uncooperative Prior Inpatient Therapy:   Prior Outpatient Therapy:    Past Medical History:  Past Medical History:  Diagnosis Date  . Acute chest wall pain 07/22/2011  . Adenomatous colon polyp    3 more polyps on colonoscopy 04/27/14 by [facility]; repeat colonoscopy 1 yr  . Allergic rhinitis 11/22/2011  . Candidal skin infection 10/03/2011  . Cataract 01/11/2013  . Cervical cancer screening 03/19/2012  . Chicken pox as a child  . Depression   . Diverticulosis 05/16/2013  . Grief reaction 10/03/2011  . Hyperlipidemia    statin intolerant  . Hypertension   . Insomnia   . Measles 17  . Medicare annual wellness visit, subsequent 07/22/2011   Follows with Dr Erlene Quan of GI Follow with Dr Sallyanne Kuster of Cardiology   . Memory loss   . MRSA  (methicillin resistant staph aureus) culture positive 10/03/2011  . Mumps as a child  . Presbycusis of both ears 12/2013   with tinnitus of both ears (Dr. Erik Obey)  . Preventative health care 07/22/2011  . RAS (renal artery stenosis) ([place])   . Renal artery stenosis ([place]) 11/24/2011   bilat, worse 12/2013, but no stenting of renal arteries is indicated as of 03/2014 per Dr. Gwenlyn Found.  Stable on f/u ultrasound 02/2015 per Dr. Kennon Holter f/u.  . Right shoulder strain 10/03/2011  . Tinea corporis 11/15/2012   Under both breasts R>L, left foot.   . Urinary frequency 07/22/2011  . Vertigo   . Whooping cough 85 yrs old    Past Surgical History:  Procedure Laterality Date  . CHOLECYSTECTOMY  1992  . colonoscopy with polypectomy  multiple; most recent 04/27/14   adenomas (many) +high grade dysplasia on most recent scope 03/2012; repeat 04/27/14 showed 3 more polyps; needs repeat colonoscopy 1 yr  . NM MYOCAR MULTIPLE W/SPECT  05/2009   EF 74%. NORMAL MYOCARDIAL PERFUSION STUDY.  Marland Kitchen RENAL DUPLEX  05/2011   RRA- 60-99% REDUCTION. LEFT MID + DISTAL RA- 1-59% DIAMETER REDUCTION BY VELOCITIES VS FIBROMUSCULAR DYSPLASIA WHICH CAN NOT BE EXCLUDED. R & L KIDNEYS- NORMAL.  Marland Kitchen SKIN BIOPSY     multiple, all benign   Family History:  Family History  Problem Relation Age of Onset  . Breast cancer Mother 59       remission, late 9's lung cancer  . Hypertension Mother        ?  . Lung cancer Mother   . Angina Father   .  Diabetes type II Father   . Heart disease Father   . Obesity Sister   . Diabetes type II Sister   . Hyperlipidemia Sister   . Heart attack Sister   . Spina bifida Brother   . Constipation Daughter   . Hypertension Daughter   . Hyperlipidemia Daughter   . Obesity Son   . Hypertension Son   . Obesity Maternal Grandmother   . Heart attack Maternal Grandmother   . Stroke Paternal Grandfather    Family Psychiatric  History:  Social History:  Social History   Substance and Sexual Activity   Alcohol Use No     Social History   Substance and Sexual Activity  Drug Use No    Social History   Socioeconomic History  . Marital status: Widowed    Spouse name: Not on file  . Number of children: 3  . Years of education: College  . Highest education level: Not on file  Occupational History  . Occupation: Retired Producer, television/film/video  Tobacco Use  . Smoking status: Former Smoker    Types: Cigarettes  . Smokeless tobacco: Never Used  . Tobacco comment: smoked for 3 years in 30's  Substance and Sexual Activity  . Alcohol use: No  . Drug use: No  . Sexual activity: Never  Other Topics Concern  . Not on file  Social History Narrative   Lives at [facility] (independent living).   Right-handed.   2 cups caffeine per day.   Social Determinants of Health   Financial Resource Strain:   . Difficulty of Paying Living Expenses: Not on file  Food Insecurity:   . Worried About Charity fundraiser in the Last Year: Not on file  . Ran Out of Food in the Last Year: Not on file  Transportation Needs:   . Lack of Transportation (Medical): Not on file  . Lack of Transportation (Non-Medical): Not on file  Physical Activity:   . Days of Exercise per Week: Not on file  . Minutes of Exercise per Session: Not on file  Stress:   . Feeling of Stress : Not on file  Social Connections:   . Frequency of Communication with Friends and Family: Not on file  . Frequency of Social Gatherings with Friends and Family: Not on file  . Attends Religious Services: Not on file  . Active Member of Clubs or Organizations: Not on file  . Attends Archivist Meetings: Not on file  . Marital Status: Not on file   Additional Social History:    Allergies:   Allergies  Allergen Reactions  . Lisinopril Cough  . Latex     She does not like the feel of Latex (or nail polish, make-up or perfume).  . Statins     Muscle cramps, rashes, loss of hair  . Keflex [Cephalexin] Rash  . Losartan      phlegm    Labs:  Results for orders placed or performed during the hospital encounter of 01/31/19 (from the past 48 hour(s))  Glucose, capillary     Status: Abnormal   Collection Time: 02/18/19  4:41 PM  Result Value Ref Range   Glucose-Capillary 133 (H) 70 - 99 mg/dL  Glucose, capillary     Status: Abnormal   Collection Time: 02/18/19  9:53 PM  Result Value Ref Range   Glucose-Capillary 132 (H) 70 - 99 mg/dL  CMP q48h     Status: Abnormal   Collection Time: 02/19/19 12:55 AM  Result Value Ref Range   Sodium 137 135 - 145 mmol/L   Potassium 4.3 3.5 - 5.1 mmol/L   Chloride 104 98 - 111 mmol/L   CO2 27 22 - 32 mmol/L   Glucose, Bld 140 (H) 70 - 99 mg/dL   BUN 35 (H) 8 - 23 mg/dL   Creatinine, Ser 0.82 0.44 - 1.00 mg/dL   Calcium 9.4 8.9 - 10.3 mg/dL   Total Protein 5.1 (L) 6.5 - 8.1 g/dL   Albumin 2.2 (L) 3.5 - 5.0 g/dL   AST 19 15 - 41 U/L   ALT 32 0 - 44 U/L   Alkaline Phosphatase 45 38 - 126 U/L   Total Bilirubin 0.7 0.3 - 1.2 mg/dL   GFR calc non Af Amer >60 >60 mL/min   GFR calc Af Amer >60 >60 mL/min   Anion gap 6 5 - 15    Comment: Performed at [facility], [place] [address], [place], [place] [address]  C-reactive protein     Status: Abnormal   Collection Time: 02/19/19 12:55 AM  Result Value Ref Range   CRP 2.1 (H) <1.0 mg/dL    Comment: Performed at [facility], [place] [address] [place], [place] [address]  D-dimer, quantitative (not at [facility])     Status: Abnormal   Collection Time: 02/19/19 12:55 AM  Result Value Ref Range   D-Dimer, Quant 0.66 (H) 0.00 - 0.50 ug/mL-FEU    Comment: (NOTE) At the manufacturer cut-off of 0.50 ug/mL FEU, this assay has been documented to exclude PE with a sensitivity and negative predictive value of 97 to 99%.  At this time, this assay has not been approved by the FDA to exclude DVT/VTE. Results should be correlated with clinical presentation. Performed at [facility], [place]  [address] [place], [place] [address]   Ferritin     Status: Abnormal   Collection Time: 02/19/19 12:55 AM  Result Value Ref Range   Ferritin 1,015 (H) 11 - 307 ng/mL    Comment: Performed at [facility], [place] [address], [place], [place] [address]  CBC-daily     Status: Abnormal   Collection Time: 02/19/19 12:55 AM  Result Value Ref Range   WBC 28.6 (H) 4.0 - 10.5 K/uL   RBC 4.46 3.87 - 5.11 MIL/uL   Hemoglobin 13.8 12.0 - 15.0 g/dL   HCT 43.3 36.0 - 46.0 %   MCV 97.1 80.0 - 100.0 fL   MCH 30.9 26.0 - 34.0 pg   MCHC 31.9 30.0 - 36.0 g/dL   RDW 13.2 11.5 - 15.5 %   Platelets 280 150 - 400 K/uL   nRBC 0.0 0.0 - 0.2 %    Comment: Performed at [facility], [place] [address] [place], [place] [address]  Glucose, capillary     Status: Abnormal   Collection Time: 02/19/19  7:38 AM  Result Value Ref Range   Glucose-Capillary 113 (H) 70 - 99 mg/dL  Glucose, capillary     Status: Abnormal   Collection Time: 02/19/19 12:46 PM  Result Value Ref Range   Glucose-Capillary 126 (H) 70 - 99 mg/dL  Glucose, capillary     Status: Abnormal   Collection Time: 02/19/19  3:58 PM  Result Value Ref Range   Glucose-Capillary 159 (H) 70 - 99 mg/dL  C-reactive protein     Status: Abnormal   Collection Time: 02/20/19  3:51 AM  Result Value Ref Range   CRP 3.3 (  H) <1.0 mg/dL    Comment: Performed at [facility], [place] [address], [place], [place] [address]  CBC-daily     Status: Abnormal   Collection Time: 02/20/19  3:51 AM  Result Value Ref Range   WBC 20.9 (H) 4.0 - 10.5 K/uL   RBC 4.11 3.87 - 5.11 MIL/uL   Hemoglobin 13.0 12.0 - 15.0 g/dL   HCT 39.9 36.0 - 46.0 %   MCV 97.1 80.0 - 100.0 fL   MCH 31.6 26.0 - 34.0 pg   MCHC 32.6 30.0 - 36.0 g/dL   RDW 13.3 11.5 - 15.5 %   Platelets 171 150 - 400 K/uL   nRBC 0.0 0.0 - 0.2 %    Comment: Performed at [facility], [place] [address], [place], [place] [address]  Glucose,  capillary     Status: Abnormal   Collection Time: 02/20/19  9:58 AM  Result Value Ref Range   Glucose-Capillary 102 (H) 70 - 99 mg/dL  Glucose, capillary     Status: Abnormal   Collection Time: 02/20/19 11:18 AM  Result Value Ref Range   Glucose-Capillary 143 (H) 70 - 99 mg/dL    Medications:  Current Facility-Administered Medications  Medication Dose Route Frequency Provider Last Rate Last Admin  . acetaminophen (TYLENOL) tablet 650 mg  650 mg Oral Q6H PRN Rise Patience, MD   650 mg at 02/11/19 1318   Or  . acetaminophen (TYLENOL) suppository 650 mg  650 mg Rectal Q6H PRN Rise Patience, MD      . amLODipine (NORVASC) tablet 10 mg  10 mg Oral Daily Wendee Beavers T, MD   10 mg at 02/20/19 1007  . dextromethorphan-guaiFENesin (MUCINEX DM) 30-600 MG per 12 hr tablet 1 tablet  1 tablet Oral BID Mercy Riding, MD   1 tablet at 02/20/19 1007  . diphenhydrAMINE (BENADRYL) capsule 25 mg  25 mg Oral QHS PRN Hollace Hayward K, NP   25 mg at 02/12/19 2115  . donepezil (ARICEPT) tablet 5 mg  5 mg Oral QHS Rise Patience, MD   5 mg at 02/17/19 2056  . enoxaparin (LOVENOX) injection 40 mg  40 mg Subcutaneous Daily Lenis Noon, RPH   40 mg at 02/18/19 1015  . feeding supplement (ENSURE ENLIVE) (ENSURE ENLIVE) liquid 237 mL  237 mL Oral BID BM Gonfa, Taye T, MD   237 mL at 02/18/19 1017  . hydrALAZINE (APRESOLINE) tablet 25 mg  25 mg Oral Q6H PRN Wendee Beavers T, MD   25 mg at 02/11/19 1321  . insulin aspart (novoLOG) injection 0-5 Units  0-5 Units Subcutaneous QHS Mercy Riding, MD   2 Units at 02/14/19 2102  . insulin aspart (novoLOG) injection 0-9 Units  0-9 Units Subcutaneous TID WC Mercy Riding, MD   2 Units at 02/19/19 1718  . liver oil-zinc oxide (DESITIN) 40 % ointment   Topical QID Mercy Riding, MD   Given at 02/20/19 1008  . multivitamin with minerals tablet 1 tablet  1 tablet Oral Daily Mercy Riding, MD   1 tablet at 02/20/19 1007  . ondansetron (ZOFRAN) tablet 4 mg  4 mg  Oral Q6H PRN Rise Patience, MD       Or  . ondansetron [facility]) injection 4 mg  4 mg Intravenous Q6H PRN Rise Patience, MD      . QUEtiapine (SEROQUEL) tablet 25 mg  25 mg Oral QHS Mercy Riding, MD  25 mg at 02/19/19 2233    Musculoskeletal: Strength & Muscle Tone: not tested Gait & Station: not tested Patient leans: N/A  Psychiatric Specialty Exam: Physical Exam  Psychiatric: Her speech is normal. Thought content normal. Her affect is angry. She is agitated. Cognition and memory are impaired. She expresses impulsivity.    Review of Systems  Psychiatric/Behavioral: Positive for agitation.    Blood pressure 123/69, pulse 82, temperature (!) 97.4 F (36.3 C), temperature source Oral, resp. rate 20, height 5\' 6"  (1.676 m), weight 83.7 kg, SpO2 93 %.Body mass index is 29.78 kg/m.  General Appearance: Casual  Eye Contact:  Good  Speech:  Clear and Coherent  Volume:  Increased  Mood:  Irritable  Affect:  Labile  Thought Process:  Coherent  Orientation:  Other:  unable to assess due to patient being uncooperative  Thought Content:  unable to assess due to patient being uncooperative  Suicidal Thoughts:  unable to assess due to patient being uncooperative  Homicidal Thoughts:  unable to assess due to patient being uncooperative  Memory:  unable to assess due to patient being uncooperative  Judgement:  Poor  Insight:  unable to assess due to patient being uncooperative  Psychomotor Activity:  Increased  Concentration:  Concentration: Poor and Attention Span: Poor  Recall:  unable to assess due to patient being uncooperative  Fund of Knowledge:  unable to assess due to patient being uncooperative  Language:  Good  Akathisia:  No  Handed:   AIMS (if indicated):     Assets:  Social Support  ADL's:  Impaired  Cognition:  unable to assess due to patient being uncooperative  Sleep:        Treatment Plan Summary: 85 year old female with history of depression and  Demential who was admitted for Covid-19 infection. She is now having intermittent agitation with refusal of care. However, she refused psychiatric assessment. It should be noted that individuals with Dementia generally lacks the ability process information and as a result lacks the ability to verbalize their needs. Therefore, patient is deemed to lack capacity to make informed medical decisions.  Recommendations: -Contact patient's family to determine if patient has a medical power of attorney who can make decision on patient's behalf. -Consider adding Lexapro 10 mg daily for intermittent agitation and depression -Consider increasing Seroquel to 50 mg at bedtime for sleep/agitation. -Psychiatric service signing out.Patient does not need imminent psychiatric intervention.  Disposition: Re-consult psychiatric service as needed  This service was provided via telemedicine using a 2-way, interactive audio and video technology.  Names of all persons participating in this telemedicine service and their role in this encounter. Name: [name] Role: Patient  Name: Shelton Silvas Role: RN  Name: Corena Pilgrim, MD Role: Psychiatrist  Name:  Role:     Corena Pilgrim, MD 02/20/2019 1:50 PM

## 2019-02-20 NOTE — Progress Notes (Signed)
PROGRESS NOTE  Taylor Vaughn XBJ:[id number] DOB: 11-18-1929   PCP: Darrow Bussing, MD  Patient is from: Independent living facility  DOA: 01/31/2019 LOS: 20  Brief Narrative / Interim history: 85 year old female with history of HTN, dementia, HLD and radiculopathy presenting with generalized weakness, nausea, vomiting and diarrhea for 2 to 3 days POA.  In ED, positive for COVID-19. CBC, CMP, CXR and CT head without acute finding. Patient was admitted for COVID-19 infection with associated GI symptoms. However, patient desaturated to 86% on RA later in the course requiring 2 L by [place] to maintain appropriate saturation. He was started on remdesivir and steroid per ID recommendation.  She is not stable from respiratory standpoint but physically deconditioned to return to independent living facility.  PT/OT recommended SNF.  However, patient's daughter turned down bed offers at two skilled nursing facilities, and asked for disposition to [facility]. RN from [facility].  Unfortunately, patient was not following directions at time of evaluation by RN from ALF on 11/16.  TOC and family working on placement.   Subjective: Reportedly agitated and screaming at care team asking to leave her alone.  She seems calm this morning.  She says she feels tired.  Denies pain, shortness of breath, nausea, vomiting or abdominal pain.  She is oriented to self and place.  Asking for pancake and coffee.  Objective: Vitals:   02/18/19 2200 02/19/19 0543 02/19/19 1505 02/20/19 1124  BP: (!) 147/83 (!) 142/86 124/69 123/69  Pulse: 70 63 69 82  Resp: (!) 22 20 18 20   Temp: 98.2 F (36.8 C) (!) 97.5 F (36.4 C) 97.8 F (36.6 C) (!) 97.4 F (36.3 C)  TempSrc: Oral Oral Oral Oral  SpO2: 92% 94% 93% 93%  Weight:      Height:        Intake/Output Summary (Last 24 hours) at 02/20/2019 1330 Last data filed at 02/20/2019 1120 Gross per 24 hour  Intake 60 ml  Output -  Net 60 ml   Filed Weights   01/31/19  2051 02/01/19 0300  Weight: 81.6 kg 83.7 kg    Examination:  GENERAL: No apparent distress.  Nontoxic. HEENT: MMM.  Vision and hearing grossly intact.  NECK: Supple.  No apparent JVD.  RESP:  No IWOB.  Fair aeration bilaterally. CVS:  RRR. Heart sounds normal.  ABD/GI/GU: Bowel sounds present. Soft. Non tender.  MSK/EXT:  Moves extremities. No apparent deformity or edema.  SKIN: no apparent skin lesion or wound NEURO: Awake.  Oriented x2.  No apparent focal neuro deficits. PSYCH: Calm.  Appropriate affect.   Procedures:  None  Assessment & Plan: Acute respiratory failure with hypoxia due to COVID-19 infection/pneumonia: -Saturating well on room air.  No respiratory distress. -Pro-Cal negative.  CRP after 3.3. -Remdesivir from 12/8-12/12.  Decadron from 12/8-12/14. -subcu Lovenox for VTE prophylaxis -Supportive care with bronchodilators, mucolytic's, antitussives, vitamins and incentive spirometry. -OOB/PT/OT-not motivated to work with therapy Recent Labs    02/18/19 0359 02/19/19 0055 02/20/19 0351  DDIMER 0.71* 0.66*  --   FERRITIN 1,064* 1,015*  --   CRP 0.7 2.1* 3.3*   Dementia with behavioral disturbance and major depression: patient agitated and refusing cares at times..  Poor p.o. intake.  Some passive suicidal thoughts previously.  TFT within normal range.  She seems to be calm and appropriate today. -Continue Zoloft 25 mg daily 12/13>> -Continue Seroquel 25 mg at bedtime -Continue reorientation and delirium precautions -Psychiatry consulted on 12/18.  Debility/physical deconditioning/FTT/generalized weakness: patient is  from independent living facility.  Daughter concerned about patient's debility and ability to return to ILF.  PT recommended SNF but family initially declined SNF.  TOC on board. -Encourage OOB and PT/OT  Nausea/vomiting/diarrhea: Likely due to the above.  Resolved. -Supportive care  Leukocytosis/bandemia: Post steroid?  No obvious source of  infection other than known COVID-19.  -Continue monitoring  Essential hypertension: Normotensive -Continue amlodipine 10 mg daily -Hydralazine 25 mg every 6 hours PRN SBP above 160 or DBP above 100.  First-degree AV block: Could be due to Aricept.  No inpatient work-up needed per cardiology -Discontinue Aricept if symptomatic or persistent bradycardia.  Right knee pain/tenderness: Seems to have resolved.  No further pain or tenderness.  Right knee x-ray without significant finding.  Uric acid within normal.  Mild hyperkalemia: Resolved. -Recheck as needed  AKI/azotemia: AKI resolved.  Azotemia improved. -Continue monitoring intermittently  Noncompliance with care-refusing care at times.   Left upper extremity edema: Likely due to PIV.  Resolved.    Nutrition Problem: Increased nutrient needs Etiology: acute illness(COVID-19 positive patient)  Signs/Symptoms: estimated needs  Interventions: MVI, Magic cup, Ensure Enlive (each supplement provides 350kcal and 20 grams of protein)   DVT prophylaxis: Subcu Lovenox Code Status: DNR/DNI Family Communication: I have updated patient's son over the phone on 12/17. Disposition Plan: Patient is from an ILF but to weak and deconditioned.  PT recommended SNF.  Family turned down SNF and requested ALF.  Was not cooperative during virtual evaluation by RN from the ALF identified by the family.  Now family reconsidering SNF.  TOC on board. Consultants: Cardiology (off), psychiatry   Microbiology summarized: 12/6-COVID-19 positive. 11/29-blood cultures negative. 11/30-MRSA PCR negative.  Sch Meds:  Scheduled Meds: . amLODipine  10 mg Oral Daily  . dextromethorphan-guaiFENesin  1 tablet Oral BID  . donepezil  5 mg Oral QHS  . enoxaparin (LOVENOX) injection  40 mg Subcutaneous Daily  . feeding supplement (ENSURE ENLIVE)  237 mL Oral BID BM  . insulin aspart  0-5 Units Subcutaneous QHS  . insulin aspart  0-9 Units Subcutaneous TID WC  .  liver oil-zinc oxide   Topical QID  . multivitamin with minerals  1 tablet Oral Daily  . QUEtiapine  25 mg Oral QHS   Continuous Infusions:  PRN Meds:.acetaminophen **OR** acetaminophen, diphenhydrAMINE, hydrALAZINE, ondansetron **OR** ondansetron (ZOFRAN) IV  Antimicrobials: Anti-infectives (From admission, onward)   Start     Dose/Rate Route Frequency Ordered Stop   02/08/19 1000  remdesivir 100 mg in sodium chloride 0.9 % 100 mL IVPB     100 mg 200 mL/hr over 30 Minutes Intravenous Daily 02/07/19 1745 02/11/19 1113   02/07/19 1800  remdesivir 100 mg in sodium chloride 0.9 % 100 mL IVPB  Status:  Discontinued     100 mg 200 mL/hr over 30 Minutes Intravenous Daily 02/07/19 1719 02/07/19 1745   02/07/19 1800  remdesivir 200 mg in sodium chloride 0.9% 250 mL IVPB     200 mg 580 mL/hr over 30 Minutes Intravenous Once 02/07/19 1745 02/07/19 1911   02/02/19 1000  remdesivir 100 mg in sodium chloride 0.9 % 250 mL IVPB  Status:  Discontinued     100 mg 500 mL/hr over 30 Minutes Intravenous Every 24 hours 02/01/19 0258 02/03/19 1713   02/01/19 0230  remdesivir 200 mg in sodium chloride 0.9 % 250 mL IVPB     200 mg 500 mL/hr over 30 Minutes Intravenous Once 02/01/19 0151 02/01/19 0935  I have personally reviewed the following labs and images: CBC: Recent Labs  Lab 02/15/19 0104 02/17/19 0053 02/18/19 0359 02/19/19 0055 02/20/19 0351  WBC 21.0* 23.8* 17.3* 28.6* 20.9*  NEUTROABS 18.5* 19.6*  --   --   --   HGB 15.8* 15.7* 15.2* 13.8 13.0  HCT 48.6* 48.0* 48.2* 43.3 39.9  MCV 95.1 94.5 98.8 97.1 97.1  PLT 387 357 279 280 171   BMP &GFR Recent Labs  Lab 02/14/19 0906 02/15/19 0104 02/15/19 0910 02/16/19 0449 02/17/19 0053 02/19/19 0055  NA 139  --  137 134* 136 137  K 5.3*  --  4.9 4.9 4.8 4.3  CL 102  --  99 100 104 104  CO2 29  --  26 25 27 27   GLUCOSE 140*  --  160* 250* 158* 140*  BUN 43*  --  46* 40* 42* 35*  CREATININE 0.94  --  0.91 1.01* 0.91 0.82   CALCIUM 10.0  --  10.2 10.0 10.0 9.4  MG 2.5* 2.4  --  2.3  --   --    Estimated Creatinine Clearance: 52.7 mL/min (by C-G formula based on SCr of 0.82 mg/dL). Liver & Pancreas: Recent Labs  Lab 02/17/19 0053 02/19/19 0055  AST 17 19  ALT 34 32  ALKPHOS 45 45  BILITOT 0.9 0.7  PROT 5.7* 5.1*  ALBUMIN 2.5* 2.2*   No results for input(s): LIPASE, AMYLASE in the last 168 hours. No results for input(s): AMMONIA in the last 168 hours. Diabetic: No results for input(s): HGBA1C in the last 72 hours. Recent Labs  Lab 02/19/19 0738 02/19/19 1246 02/19/19 1558 02/20/19 0958 02/20/19 1118  GLUCAP 113* 126* 159* 102* 143*   Cardiac Enzymes: No results for input(s): CKTOTAL, CKMB, CKMBINDEX, TROPONINI in the last 168 hours. No results for input(s): PROBNP in the last 8760 hours. Coagulation Profile: No results for input(s): INR, PROTIME in the last 168 hours. Thyroid Function Tests: No results for input(s): TSH, T4TOTAL, FREET4, T3FREE, THYROIDAB in the last 72 hours. Lipid Profile: No results for input(s): CHOL, HDL, LDLCALC, TRIG, CHOLHDL, LDLDIRECT in the last 72 hours. Anemia Panel: Recent Labs    02/18/19 0359 02/19/19 0055  FERRITIN 1,064* 1,015*   Urine analysis:    Component Value Date/Time   COLORURINE YELLOW 02/02/2019 2043   APPEARANCEUR HAZY (A) 02/02/2019 2043   LABSPEC 1.012 02/02/2019 2043   PHURINE 6.0 02/02/2019 2043   GLUCOSEU NEGATIVE 02/02/2019 2043   GLUCOSEU NEGATIVE 11/15/2013 1106   HGBUR NEGATIVE 02/02/2019 2043   BILIRUBINUR NEGATIVE 02/02/2019 2043   BILIRUBINUR neg 12/31/2011 1355   KETONESUR NEGATIVE 02/02/2019 2043   PROTEINUR NEGATIVE 02/02/2019 2043   UROBILINOGEN 0.2 11/15/2013 1106   NITRITE NEGATIVE 02/02/2019 2043   LEUKOCYTESUR NEGATIVE 02/02/2019 2043   Sepsis Labs: Invalid input(s): PROCALCITONIN, LACTICIDVEN  Microbiology: No results found for this or any previous visit (from the past 240 hour(s)).  Radiology Studies:  No results found.   Abram Sax T. Charice Zuno [facility]  If 7PM-7AM, please contact night-coverage [email or web address] Password [email or web address] 02/20/2019, 1:30 PM

## 2019-02-20 NOTE — Progress Notes (Signed)
This RN spoke with daughter [name] over the phone.  Daughter is asking that case manager/social worker fax [phone] to [facility].  Daughter wants patient to go there with outside agency coming in to provide 24 hour care until patient gets more mobile.    Daughter is also asking that a wheelchair and hoyer lift be ordered for patient to use at [facility].  Daughter did speak with patient over the phone at this time as well.

## 2019-02-20 NOTE — Progress Notes (Signed)
Patient did allow RN and nurse tech to obtain EKG and vital signs, denies any chest pain or other complaint.  Asked for food, [place] sandwich, coke and ice cream given.

## 2019-02-21 LAB — GLUCOSE, CAPILLARY
Glucose-Capillary: 123 mg/dL — ABNORMAL HIGH (ref 70–99)
Glucose-Capillary: 176 mg/dL — ABNORMAL HIGH (ref 70–99)
Glucose-Capillary: 126 mg/dL — ABNORMAL HIGH (ref 70–99)
Glucose-Capillary: 115 mg/dL — ABNORMAL HIGH (ref 70–99)

## 2019-02-21 LAB — CBC
HCT: 41.2 % (ref 36.0–46.0)
Hemoglobin: 13.1 g/dL (ref 12.0–15.0)
MCH: 31.9 pg (ref 26.0–34.0)
MCHC: 31.8 g/dL (ref 30.0–36.0)
MCV: 100.2 fL — ABNORMAL HIGH (ref 80.0–100.0)
Platelets: 259 10*3/uL (ref 150–400)
RBC: 4.11 MIL/uL (ref 3.87–5.11)
RDW: 13.4 % (ref 11.5–15.5)
WBC: 18.7 10*3/uL — ABNORMAL HIGH (ref 4.0–10.5)
nRBC: 0 % (ref 0.0–0.2)

## 2019-02-21 NOTE — TOC Progression Note (Signed)
Transition of Care [facility]) - Progression Note    Patient Details  Name: Taylor Vaughn MRN: [id number] Date of Birth: 17-Dec-1929  Transition of Care [facility]) CM/SW Contact  Servando Snare, LCSW Phone Number: 02/21/2019, 3:35 PM  Clinical Narrative:    According to son, [facility]. Clinicals were faxed to [facility] on 12/18. Weekday TOC will follow up with [facility] for details.         Expected Discharge Plan and Services           Expected Discharge Date: 02/02/19                                     Social Determinants of Health (SDOH) Interventions    Readmission Risk Interventions No flowsheet data found.

## 2019-02-21 NOTE — Discharge Instructions (Signed)

## 2019-02-21 NOTE — Progress Notes (Signed)
Telemetry notified RN of a non sustained HR of 37. Vital signs were taken.VS were within acceptable range. Provider X.Blount notified.  Upon assessing patient, pt. States she feels fine and would like a bowl of cereal.

## 2019-02-21 NOTE — Progress Notes (Signed)
PROGRESS NOTE  Taylor Vaughn [id number] DOB: Jun 23, 1929 DOA: 01/31/2019 PCP: Lujean Amel, MD   LOS: 21 days   Brief Narrative / Interim history: 85 year old female with history of HTN, dementia, HLD and radiculopathy presenting with generalized weakness, nausea, vomiting and diarrhea for 2 to 3 days POA. In ED, positive for COVID-19. CBC, CMP, CXR and CT head without acute finding. Patient was admitted for COVID-19 infection with associated GI symptoms. However, patient desaturated to 86% on RA later in the course requiring 2 L by [place] to maintain appropriate saturation. He was started on remdesivir and steroid per ID recommendation.  She is not stable from respiratory standpoint but physically deconditioned to return to independent living facility.  PT/OT recommended SNF.  However, patient's daughter turned down bed offers at two skilled nursing facilities, and asked for disposition to [facility]. RN from [facility].  Unfortunately, patient was not following directions at time of evaluation by RN from ALF on 11/16.  TOC and family working on placement.  Subjective / 24h Interval events: She appears calm for me, no complaints.  No chest pain, no shortness of breath.  No abdominal pain, nausea or vomiting.  Assessment & Plan:  Principal Problem Acute Hypoxic Respiratory Failure due to Covid-19 Viral Illness -Respiratory status is stable, she is on room air this morning.  Finished remdesivir as well as Decadron, now awaiting SNF  COVID-19 Labs  Recent Labs    02/19/19 0055 02/20/19 0351  DDIMER 0.66*  --   FERRITIN 1,015*  --   CRP 2.1* 3.3*    Lab Results  Component Value Date   SARSCOV2NAA POSITIVE (A) 02/07/2019   Active Problems Dementia with behavioral disturbances -Continue Zoloft, Seroquel, psychiatry has been consulted and evaluated patient on 12/19.  Frequent reorientation  Nausea, vomiting, diarrhea -In the setting of Covid,  resolved  Leukocytosis -Possibly related to steroids, no evidence of a bacterial infection  Hypertension -Normotensive, continue Norvasc and hydralazine as needed  First-degree AV block -Could be due to Aricept, no inpatient work-up per cardiology  Right knee pain/tenderness -Resolved, x-ray without acute findings  Acute kidney injury -Resolved  Noncompliance with care  Debility/physical deconditioning -SNF pending  Scheduled Meds: . amLODipine  10 mg Oral Daily  . apixaban  5 mg Oral BID  . dextromethorphan-guaiFENesin  1 tablet Oral BID  . donepezil  5 mg Oral QHS  . escitalopram  10 mg Oral Daily  . feeding supplement (ENSURE ENLIVE)  237 mL Oral BID BM  . insulin aspart  0-5 Units Subcutaneous QHS  . insulin aspart  0-9 Units Subcutaneous TID WC  . liver oil-zinc oxide   Topical QID  . metoprolol tartrate  12.5 mg Oral BID  . multivitamin with minerals  1 tablet Oral Daily  . QUEtiapine  50 mg Oral QHS   Continuous Infusions: PRN Meds:.acetaminophen **OR** acetaminophen, diphenhydrAMINE, guaiFENesin-dextromethorphan, hydrALAZINE, ondansetron **OR** ondansetron (ZOFRAN) IV  DVT prophylaxis: Eliquis Code Status: DNR Family Communication: no family at bedside Disposition Plan: home when ready   Consultants:  none  Procedures:  none  Microbiology: none  Antimicrobials: none   Objective: Vitals:   02/20/19 2122 02/21/19 0337 02/21/19 1105 02/21/19 1155  BP: (!) 142/66 115/65  136/61  Pulse: 68 65 82 72  Resp: 18 16    Temp: (!) 97.2 F (36.2 C) 97.9 F (36.6 C)  (!) 97.5 F (36.4 C)  TempSrc: Oral Oral  Oral  SpO2: 93% 97%  93%  Weight:  Height:        Intake/Output Summary (Last 24 hours) at 02/21/2019 1335 Last data filed at 02/20/2019 1800 Gross per 24 hour  Intake --  Output 150 ml  Net -150 ml   Filed Weights   01/31/19 2051 02/01/19 0300  Weight: 81.6 kg 83.7 kg    Examination:  Constitutional: NAD Eyes: no scleral  icterus ENMT: Mucous membranes are moist.  Neck: normal, supple Respiratory: clear to auscultation bilaterally, no wheezing, no crackles. Normal respiratory effort. No accessory muscle use.  Cardiovascular: Regular rate and rhythm, no murmurs / rubs / gallops. No LE edema. Good peripheral pulses Abdomen: non distended, no tenderness. Bowel sounds positive.  Musculoskeletal: no clubbing / cyanosis.  Skin: no rashes Neurologic: CN 2-12 grossly intact. Strength 5/5 in all 4.   Data Reviewed: I have independently reviewed following labs and imaging studies   CBC: Recent Labs  Lab 02/15/19 0104 02/17/19 0053 02/18/19 0359 02/19/19 0055 02/20/19 0351 02/21/19 0340  WBC 21.0* 23.8* 17.3* 28.6* 20.9* 18.7*  NEUTROABS 18.5* 19.6*  --   --   --   --   HGB 15.8* 15.7* 15.2* 13.8 13.0 13.1  HCT 48.6* 48.0* 48.2* 43.3 39.9 41.2  MCV 95.1 94.5 98.8 97.1 97.1 100.2*  PLT 387 357 279 280 171 [id number]   Basic Metabolic Panel: Recent Labs  Lab 02/15/19 0104 02/15/19 0910 02/16/19 0449 02/17/19 0053 02/19/19 0055  NA  --  137 134* 136 137  K  --  4.9 4.9 4.8 4.3  CL  --  99 100 104 104  CO2  --  26 25 27 27   GLUCOSE  --  160* 250* 158* 140*  BUN  --  46* 40* 42* 35*  CREATININE  --  0.91 1.01* 0.91 0.82  CALCIUM  --  10.2 10.0 10.0 9.4  MG 2.4  --  2.3  --   --    GFR: Estimated Creatinine Clearance: 52.7 mL/min (by C-G formula based on SCr of 0.82 mg/dL). Liver Function Tests: Recent Labs  Lab 02/17/19 0053 02/19/19 0055  AST 17 19  ALT 34 32  ALKPHOS 45 45  BILITOT 0.9 0.7  PROT 5.7* 5.1*  ALBUMIN 2.5* 2.2*   No results for input(s): LIPASE, AMYLASE in the last 168 hours. No results for input(s): AMMONIA in the last 168 hours. Coagulation Profile: No results for input(s): INR, PROTIME in the last 168 hours. Cardiac Enzymes: No results for input(s): CKTOTAL, CKMB, CKMBINDEX, TROPONINI in the last 168 hours. BNP (last 3 results) No results for input(s): PROBNP in the last  8760 hours. HbA1C: No results for input(s): HGBA1C in the last 72 hours. CBG: Recent Labs  Lab 02/20/19 1118 02/20/19 1630 02/20/19 2119 02/21/19 0804 02/21/19 1141  GLUCAP 143* 100* 115* 123* 176*   Lipid Profile: No results for input(s): CHOL, HDL, LDLCALC, TRIG, CHOLHDL, LDLDIRECT in the last 72 hours. Thyroid Function Tests: No results for input(s): TSH, T4TOTAL, FREET4, T3FREE, THYROIDAB in the last 72 hours. Anemia Panel: Recent Labs    02/19/19 0055  FERRITIN 1,015*   Urine analysis:    Component Value Date/Time   COLORURINE YELLOW 02/02/2019 2043   APPEARANCEUR HAZY (A) 02/02/2019 2043   LABSPEC 1.012 02/02/2019 2043   PHURINE 6.0 02/02/2019 2043   GLUCOSEU NEGATIVE 02/02/2019 2043   GLUCOSEU NEGATIVE 11/15/2013 1106   [place] 02/02/2019 2043   [place] NEGATIVE 02/02/2019 2043   BILIRUBINUR neg 12/31/2011 1355   [place] 02/02/2019 2043  PROTEINUR NEGATIVE 02/02/2019 2043   UROBILINOGEN 0.2 11/15/2013 1106   NITRITE NEGATIVE 02/02/2019 2043   LEUKOCYTESUR NEGATIVE 02/02/2019 2043   Sepsis Labs: Invalid input(s): PROCALCITONIN, LACTICIDVEN  No results found for this or any previous visit (from the past 240 hour(s)).    Radiology Studies: No results found.   Marzetta Board, MD, PhD [facility]  Contact via  [email or web address]  [place] P: [phone] F: [phone]

## 2019-02-22 DIAGNOSIS — R05 Cough: Secondary | ICD-10-CM

## 2019-02-22 LAB — CBC
HCT: 43.5 % (ref 36.0–46.0)
Hemoglobin: 13.6 g/dL (ref 12.0–15.0)
MCH: 31.2 pg (ref 26.0–34.0)
MCHC: 31.3 g/dL (ref 30.0–36.0)
MCV: 99.8 fL (ref 80.0–100.0)
Platelets: 260 10*3/uL (ref 150–400)
RBC: 4.36 MIL/uL (ref 3.87–5.11)
RDW: 13.3 % (ref 11.5–15.5)
WBC: 16.2 10*3/uL — ABNORMAL HIGH (ref 4.0–10.5)
nRBC: 0 % (ref 0.0–0.2)

## 2019-02-22 LAB — GLUCOSE, CAPILLARY
Glucose-Capillary: 117 mg/dL — ABNORMAL HIGH (ref 70–99)
Glucose-Capillary: 96 mg/dL (ref 70–99)
Glucose-Capillary: 146 mg/dL — ABNORMAL HIGH (ref 70–99)

## 2019-02-22 MED ORDER — APIXABAN 5 MG PO TABS: 5.0000 mg | ORAL_TABLET | Freq: Two times a day (BID) | ORAL | 0 refills | Status: AC

## 2019-02-22 MED ORDER — METOPROLOL TARTRATE 25 MG PO TABS: 12.5000 mg | ORAL_TABLET | Freq: Two times a day (BID) | ORAL | 0 refills | Status: AC

## 2019-02-22 MED ORDER — ESCITALOPRAM OXALATE 10 MG PO TABS: 10.0000 mg | ORAL_TABLET | Freq: Every day | ORAL | 0 refills | Status: AC

## 2019-02-22 NOTE — Discharge Summary (Signed)
Physician Discharge Summary  Taylor Vaughn [id number] DOB: [birth year] DOA: 01/31/2019  PCP: Lujean Amel, MD  Admit date: 01/31/2019 Discharge date: 02/22/2019   Recommendations for Outpatient Follow-up:  1. Follow up with PCP in 1-2 weeks  Home Health: none Equipment/Devices: none  Discharge Condition: stable CODE STATUS: DNR Diet recommendation: regular  HPI: Per admitting MD, Taylor Vaughn is a 85 y.o. female with history of hypertension and memory issues on Aricept and amlodipine over the last 2 to 3 days has been feeling more weak than usual.  As per the patient's daughter patient has been gradually decreasing activity and become more bedbound.  Over the last 2 days patient has benign nausea vomiting and diarrhea and feeling very weak to the point that she is no edema able to get onto the commode and get out.  Patient lives in independent living facility.  Patient was brought to the ER for further management and constellation of symptoms were concerning for COVID-19. ED Course: In the ER COVID-19 was positive.  Labs show creatinine 0.7 LFTs normal hemoglobin 15.7 platelets 263 high-sensitivity troponin of 12.  Chest x-ray was unremarkable CT head is unremarkable.  EKG shows normal sinus rhythm.  Patient was admitted for Covid infection with progressive weakness.  On my exam patient is alert awake oriented to name moves all extremities while lying on the bed.  Hospital Course / Discharge diagnoses: Acute Hypoxic Respiratory Failure due to Covid-19 Viral Illness -Respiratory status is stable, she is on room air this morning.  Finished remdesivir as well as Decadron Active Problems Dementia -Continue Zoloft, psychiatry has been consulted and evaluated patient on 12/19 Nausea, vomiting, diarrhea -In the setting of Covid, resolved Leukocytosis -Possibly related to steroids, no evidence of a bacterial infection, white count slowly improving Hypertension -Normotensive,  continue Norvasc and hydralazine as needed PAF- in sinus, continue BB and Eliquis First-degree AV block -Could be due to Aricept, no inpatient work-up per cardiology Right knee pain/tenderness -Resolved, x-ray without acute findings Acute kidney injury -Resolved Debility/physical deconditioning -post covid  Discharge Instructions  Discharge Instructions    Diet - low sodium heart healthy   Complete by: As directed    Increase activity slowly   Complete by: As directed      Allergies as of 02/22/2019      Reactions   Lisinopril Cough   Latex    She does not like the feel of Latex (or nail polish, make-up or perfume).   Statins    Muscle cramps, rashes, loss of hair   Keflex [cephalexin] Rash   Losartan    phlegm      Medication List    TAKE these medications   amLODipine 2.5 MG tablet Commonly known as: NORVASC Take 2.5 mg by mouth daily.   apixaban 5 MG Tabs tablet Commonly known as: ELIQUIS Take 1 tablet (5 mg total) by mouth 2 (two) times daily.   CENTRUM SILVER ADULT 50+ PO Take 1 tablet by mouth daily.   donepezil 5 MG tablet Commonly known as: ARICEPT Take 5 mg by mouth at bedtime.   escitalopram 10 MG tablet Commonly known as: LEXAPRO Take 1 tablet (10 mg total) by mouth daily. Start taking on: February 23, 2019   Fish Oil Plus Co Q-10 1000-30 MG Caps Take 1 tablet by mouth daily.   metoprolol tartrate 25 MG tablet Commonly known as: LOPRESSOR Take 0.5 tablets (12.5 mg total) by mouth 2 (two) times daily.      Follow-up Information  Koirala, Dibas, MD Follow up in 1 week(s).   Specialty: Family Medicine Contact information: [place] [address] [phone]           Consultations:  Psychiatry   Cardiology   Procedures/Studies:  DG Knee 1-2 Views Right  Result Date: 02/17/2019 CLINICAL DATA:  Right knee pain and swelling. EXAM: RIGHT KNEE - 1-2 VIEW COMPARISON:  None. FINDINGS: No fracture  or dislocation. Mild degenerative change of the right knee and patellofemoral joints with joint space loss and subchondral sclerosis. There is minimal spurring of the tibial spines. No evidence of chondrocalcinosis. No joint effusion. Minimal enthesopathic change involving the superior pole of the patella. Scattered adjacent vascular calcifications. Regional soft tissues appear otherwise normal. No radiopaque foreign body. IMPRESSION: 1. No acute findings. 2. Mild degenerative change of the knee. Electronically Signed   By: Sandi Mariscal M.D.   On: 02/17/2019 11:10   CT HEAD WO CONTRAST  Result Date: 02/01/2019 CLINICAL DATA:  COVID positive.  Altered mental status. EXAM: CT HEAD WITHOUT CONTRAST TECHNIQUE: Contiguous axial images were obtained from the base of the skull through the vertex without intravenous contrast. COMPARISON:  None. FINDINGS: Brain: There is atrophy and chronic small vessel disease changes. No acute intracranial abnormality. Specifically, no hemorrhage, hydrocephalus, mass lesion, acute infarction, or significant intracranial injury. Vascular: No hyperdense vessel or unexpected calcification. Skull: No acute calvarial abnormality. Sinuses/Orbits: No acute findings Other: None IMPRESSION: Atrophy, chronic microvascular disease. No acute intracranial abnormality. Electronically Signed   By: Rolm Baptise M.D.   On: 02/01/2019 02:55   DG CHEST PORT 1 VIEW  Result Date: 02/07/2019 CLINICAL DATA:  Cough.  Positive COVID-19. EXAM: PORTABLE CHEST 1 VIEW COMPARISON:  02/03/2019 FINDINGS: The heart is borderline enlarged but stable. Stable tortuosity and calcification of the thoracic aorta. New patchy peripheral infiltrates consistent with COVID-19 pneumonia. No pleural effusions. IMPRESSION: New patchy peripheral infiltrates. Electronically Signed   By: Marijo Sanes M.D.   On: 02/07/2019 16:27   DG CHEST PORT 1 VIEW  Result Date: 02/03/2019 CLINICAL DATA:  Shortness of breath. COVID-19.  EXAM: PORTABLE CHEST 1 VIEW COMPARISON:  01/31/2019 FINDINGS: The heart size and pulmonary vascularity are normal and the lungs are clear. No bone abnormality of significance. IMPRESSION: No active disease. Aortic Atherosclerosis (ICD10-I70.0). Electronically Signed   By: Lorriane Shire M.D.   On: 02/03/2019 14:58   DG Chest Portable 1 View  Result Date: 01/31/2019 CLINICAL DATA:  Cough, fever EXAM: PORTABLE CHEST 1 VIEW COMPARISON:  03/14/2015 FINDINGS: Heart and mediastinal contours are within normal limits. No focal opacities or effusions. No acute bony abnormality. IMPRESSION: No active cardiopulmonary disease. Electronically Signed   By: Rolm Baptise M.D.   On: 01/31/2019 21:45      Subjective: -alert but confused  Discharge Exam: BP 129/69 (BP Location: Right Arm)   Pulse 66   Temp (!) 97.4 F (36.3 C) (Axillary)   Resp 18   Ht 5\' 6"  (1.676 m)   Wt 83.7 kg   SpO2 94%   BMI 29.78 kg/m   General: Pt is alert, awake, not in acute distress Cardiovascular: RRR, S1/S2 +, no rubs, no gallops Respiratory: CTA bilaterally, no wheezing, no rhonchi Abdominal: Soft, NT, ND, bowel sounds + Extremities: no edema, no cyanosis   The results of significant diagnostics from this hospitalization (including imaging, microbiology, ancillary and laboratory) are listed below for reference.     Microbiology: No results found for this or any previous  visit (from the past 240 hour(s)).   Labs: Basic Metabolic Panel: Recent Labs  Lab 02/16/19 0449 02/17/19 0053 02/19/19 0055  NA 134* 136 137  K 4.9 4.8 4.3  CL 100 104 104  CO2 25 27 27   GLUCOSE 250* 158* 140*  BUN 40* 42* 35*  CREATININE 1.01* 0.91 0.82  CALCIUM 10.0 10.0 9.4  MG 2.3  --   --    Liver Function Tests: Recent Labs  Lab 02/17/19 0053 02/19/19 0055  AST 17 19  ALT 34 32  ALKPHOS 45 45  BILITOT 0.9 0.7  PROT 5.7* 5.1*  ALBUMIN 2.5* 2.2*   CBC: Recent Labs  Lab 02/17/19 0053 02/18/19 0359 02/19/19 0055  02/20/19 0351 02/21/19 0340 02/22/19 0342  WBC 23.8* 17.3* 28.6* 20.9* 18.7* 16.2*  NEUTROABS 19.6*  --   --   --   --   --   HGB 15.7* 15.2* 13.8 13.0 13.1 13.6  HCT 48.0* 48.2* 43.3 39.9 41.2 43.5  MCV 94.5 98.8 97.1 97.1 100.2* 99.8  PLT 357 279 280 171 259 260   CBG: Recent Labs  Lab 02/21/19 0804 02/21/19 1141 02/21/19 1726 02/21/19 2020 02/22/19 1150  GLUCAP 123* 176* 126* 115* 117*   Hgb A1c No results for input(s): HGBA1C in the last 72 hours. Lipid Profile No results for input(s): CHOL, HDL, LDLCALC, TRIG, CHOLHDL, LDLDIRECT in the last 72 hours. Thyroid function studies No results for input(s): TSH, T4TOTAL, T3FREE, THYROIDAB in the last 72 hours.  Invalid input(s): FREET3 Urinalysis    Component Value Date/Time   COLORURINE YELLOW 02/02/2019 2043   APPEARANCEUR HAZY (A) 02/02/2019 2043   LABSPEC 1.012 02/02/2019 2043   PHURINE 6.0 02/02/2019 2043   GLUCOSEU NEGATIVE 02/02/2019 2043   GLUCOSEU NEGATIVE 11/15/2013 1106   [place] 02/02/2019 2043   BILIRUBINUR NEGATIVE 02/02/2019 2043   BILIRUBINUR neg 12/31/2011 1355   [place] 02/02/2019 2043   PROTEINUR NEGATIVE 02/02/2019 2043   UROBILINOGEN 0.2 11/15/2013 1106   NITRITE NEGATIVE 02/02/2019 2043   LEUKOCYTESUR NEGATIVE 02/02/2019 2043    FURTHER DISCHARGE INSTRUCTIONS:   Get Medicines reviewed and adjusted: Please take all your medications with you for your next visit with your Primary MD   Laboratory/radiological data: Please request your Primary MD to go over all hospital tests and procedure/radiological results at the follow up, please ask your Primary MD to get all Hospital records sent to his/her office.   In some cases, they will be blood work, cultures and biopsy results pending at the time of your discharge. Please request that your primary care M.D. goes through all the records of your hospital data and follows up on these results.   Also Note the following: If you  experience worsening of your admission symptoms, develop shortness of breath, life threatening emergency, suicidal or homicidal thoughts you must seek medical attention immediately by calling 911 or calling your MD immediately  if symptoms less severe.   You must read complete instructions/literature along with all the possible adverse reactions/side effects for all the Medicines you take and that have been prescribed to you. Take any new Medicines after you have completely understood and accpet all the possible adverse reactions/side effects.    Do not drive when taking Pain medications or sleeping medications (Benzodaizepines)   Do not take more than prescribed Pain, Sleep and Anxiety Medications. It is not advisable to combine anxiety,sleep and pain medications without talking with your primary care practitioner   Special Instructions: If you  have smoked or chewed Tobacco  in the last 2 yrs please stop smoking, stop any regular Alcohol  and or any Recreational drug use.   Wear Seat belts while driving.   Please note: You were cared for by a hospitalist during your hospital stay. Once you are discharged, your primary care physician will handle any further medical issues. Please note that NO REFILLS for any discharge medications will be authorized once you are discharged, as it is imperative that you return to your primary care physician (or establish a relationship with a primary care physician if you do not have one) for your post hospital discharge needs so that they can reassess your need for medications and monitor your lab values.  Time coordinating discharge: 40 minutes  SIGNED:  Marzetta Board, MD, PhD 02/22/2019, 12:53 PM

## 2019-02-22 NOTE — Progress Notes (Signed)
Pt is discharged. All belonging sent with patient. [name] updated.

## 2019-02-22 NOTE — Progress Notes (Signed)
    Durable Medical Equipment  (From admission, onward)         Start     Ordered   02/22/19 1330  For home use only DME standard manual wheelchair with seat cushion  Once    Comments: Patient suffers from dementia, weakness, which impairs their ability to perform daily activities like bathing, dressing, feeding and grooming in the home.  A cane, crutch or walker will not resolve issue with performing activities of daily living. A wheelchair will allow patient to safely perform daily activities. Patient can safely propel the wheelchair in the home or has a caregiver who can provide assistance. Length of need Lifetime. Accessories: elevating leg rests (ELRs), wheel locks, extensions and anti-tippers.   02/22/19 1329   02/22/19 1303  For home use only DME Hospital bed  Once    Question Answer Comment  Length of Need Lifetime   The above medical condition requires: Patient requires the ability to reposition frequently   Head must be elevated greater than: 30 degrees   Bed type Semi-electric      02/22/19 1302

## 2019-02-22 NOTE — NC FL2 (Signed)
[place] LEVEL OF CARE SCREENING TOOL     IDENTIFICATION  Patient Name: Taylor Vaughn Birthdate: 1929-07-25 Sex: female Admission Date (Current Location): 01/31/2019  [facility] and [place] Number:  Herbalist and Address:         Provider Number: [id number]  Attending Physician Name and Address:  Caren Griffins, MD  Relative Name and Phone Number:  Daryel November [phone]    Current Level of Care: Hospital Recommended Level of Care: Assisted Living Facility([facility]) Prior Approval Number:    Date Approved/Denied:   PASRR Number:    Discharge Plan: Other (Comment)([facility])    Current Diagnoses: Patient Active Problem List   Diagnosis Date Noted  . Dementia with behavioral disturbance ([place]) 02/20/2019  . SIRS (systemic inflammatory response syndrome) ([place]) 02/01/2019  . COVID-19 virus infection 01/31/2019  . Vertigo 11/06/2016  . Gait abnormality 11/06/2016  . Sciatica associated with disorder of lumbar spine 05/29/2015  . Right leg pain 05/02/2015  . Right knee pain 05/02/2015  . Lesion of skin of face 05/02/2015  . Maxillary sinusitis, acute 03/14/2015  . Cough 03/14/2015  . Left knee injury 10/21/2014  . Cataract 01/11/2013  . Tinea corporis 11/15/2012  . Cervical cancer screening 03/19/2012  . Renal artery stenosis ([place]) 11/24/2011  . Allergic rhinitis 11/22/2011  . Right shoulder strain 10/03/2011  . MRSA (methicillin resistant staph aureus) culture positive 10/03/2011  . Grief reaction 10/03/2011  . Candidal skin infection 10/03/2011  . Urinary frequency 07/22/2011  . Acute chest wall pain 07/22/2011  . Medicare annual wellness visit, subsequent 07/22/2011  . Hypertension   . Hyperlipidemia   . Insomnia   . Colon polyps     Orientation RESPIRATION BLADDER Height & Weight     Self  Normal Continent Weight: 184 lb 8.4 oz (83.7 kg) Height:  5\' 6"  (167.6 cm)  BEHAVIORAL  SYMPTOMS/MOOD NEUROLOGICAL BOWEL NUTRITION STATUS      Continent Diet(regular heart healthy)  AMBULATORY STATUS COMMUNICATION OF NEEDS Skin   Limited Assist Verbally Other (Comment)(moisture skin damage buttock, groin area)                       Personal Care Assistance Level of Assistance  Bathing, Feeding, Dressing Bathing Assistance: Limited assistance Feeding assistance: Independent Dressing Assistance: Limited assistance     Functional Limitations Info  Sight, Hearing, Speech Sight Info: Adequate Hearing Info: Adequate Speech Info: Adequate    SPECIAL CARE FACTORS FREQUENCY  PT (By licensed PT), OT (By licensed OT)     PT Frequency: 3x wk home health OT Frequency: 3x wk home health            Contractures Contractures Info: Not present    Additional Factors Info  Code Status, Allergies Code Status Info: DNR Allergies Info: Lisinopril, Latex, Statins, Keflex Cephalexin, Losartan           Current Medications (02/22/2019):  This is the current hospital active medication list Current Facility-Administered Medications  Medication Dose Route Frequency Provider Last Rate Last Admin  . acetaminophen (TYLENOL) tablet 650 mg  650 mg Oral Q6H PRN Rise Patience, MD   650 mg at 02/11/19 1318   Or  . acetaminophen (TYLENOL) suppository 650 mg  650 mg Rectal Q6H PRN Rise Patience, MD      . amLODipine (NORVASC) tablet 10 mg  10 mg Oral Daily Wendee Beavers T, MD   10 mg at 02/22/19 1212  .  apixaban (ELIQUIS) tablet 5 mg  5 mg Oral BID Wendee Beavers T, MD   5 mg at 02/22/19 1211  . dextromethorphan-guaiFENesin (MUCINEX DM) 30-600 MG per 12 hr tablet 1 tablet  1 tablet Oral BID Mercy Riding, MD   1 tablet at 02/22/19 1212  . diphenhydrAMINE (BENADRYL) capsule 25 mg  25 mg Oral QHS PRN Hollace Hayward K, NP   25 mg at 02/12/19 2115  . donepezil (ARICEPT) tablet 5 mg  5 mg Oral QHS Rise Patience, MD   5 mg at 02/21/19 2024  . escitalopram (LEXAPRO) tablet  10 mg  10 mg Oral Daily Wendee Beavers T, MD   10 mg at 02/22/19 1211  . feeding supplement (ENSURE ENLIVE) (ENSURE ENLIVE) liquid 237 mL  237 mL Oral BID BM Wendee Beavers T, MD   237 mL at 02/22/19 1219  . guaiFENesin-dextromethorphan (ROBITUSSIN DM) 100-10 MG/5ML syrup 5 mL  5 mL Oral Q4H PRN Gonfa, Taye T, MD      . hydrALAZINE (APRESOLINE) tablet 25 mg  25 mg Oral Q6H PRN Wendee Beavers T, MD   25 mg at 02/11/19 1321  . insulin aspart (novoLOG) injection 0-5 Units  0-5 Units Subcutaneous QHS Mercy Riding, MD   2 Units at 02/14/19 2102  . insulin aspart (novoLOG) injection 0-9 Units  0-9 Units Subcutaneous TID WC Mercy Riding, MD   2 Units at 02/19/19 1718  . liver oil-zinc oxide (DESITIN) 40 % ointment   Topical QID Mercy Riding, MD   Given at 02/22/19 1219  . metoprolol tartrate (LOPRESSOR) tablet 12.5 mg  12.5 mg Oral BID Wendee Beavers T, MD   12.5 mg at 02/22/19 1212  . multivitamin with minerals tablet 1 tablet  1 tablet Oral Daily Mercy Riding, MD   1 tablet at 02/22/19 1212  . ondansetron (ZOFRAN) tablet 4 mg  4 mg Oral Q6H PRN Rise Patience, MD       Or  . ondansetron [facility]) injection 4 mg  4 mg Intravenous Q6H PRN Rise Patience, MD      . QUEtiapine (SEROQUEL) tablet 50 mg  50 mg Oral QHS Mercy Riding, MD   50 mg at 02/21/19 2026     Discharge Medications: Please see discharge summary for a list of discharge medications.  Relevant Imaging Results:  Relevant Lab Results:   Additional Information [id number]  Wende Neighbors, LCSW

## 2019-02-22 NOTE — Progress Notes (Deleted)
PROGRESS NOTE  Taylor Vaughn [id number] DOB: Jan 28, 1930 DOA: 01/31/2019 PCP: Lujean Amel, MD   LOS: 22 days   Brief Narrative / Interim history: 85 year old female with history of HTN, dementia, HLD and radiculopathy presenting with generalized weakness, nausea, vomiting and diarrhea for 2 to 3 days POA. In ED, positive for COVID-19. CBC, CMP, CXR and CT head without acute finding. Patient was admitted for COVID-19 infection with associated GI symptoms. However, patient desaturated to 86% on RA later in the course requiring 2 L by [place] to maintain appropriate saturation. He was started on remdesivir and steroid per ID recommendation.  She is not stable from respiratory standpoint but physically deconditioned to return to independent living facility.  PT/OT recommended SNF.  However, patient's daughter turned down bed offers at two skilled nursing facilities, and asked for disposition to [facility]. RN from [facility].  Unfortunately, patient was not following directions at time of evaluation by RN from ALF on 11/16.  TOC and family working on placement.  Subjective / 24h Interval events: She appears calm for me, no complaints.  No chest pain, no shortness of breath.  No abdominal pain, nausea or vomiting.  Assessment & Plan:  Principal Problem Acute Hypoxic Respiratory Failure due to Covid-19 Viral Illness -Respiratory status is stable, she is on room air this morning.  Finished remdesivir as well as Decadron, now awaiting SNF -No bed offers yet, discussed with TOC  COVID-19 Labs  Recent Labs    02/20/19 0351  CRP 3.3*    Lab Results  Component Value Date   [place] (A) 02/07/2019   Active Problems Dementia with behavioral disturbances -Continue Zoloft, Seroquel, psychiatry has been consulted and evaluated patient on 12/19.  Frequent reorientation  Nausea, vomiting, diarrhea -In the setting of Covid, resolved  Leukocytosis -Possibly related to steroids,  no evidence of a bacterial infection, white count slowly improving  Hypertension -Normotensive, continue Norvasc and hydralazine as needed  First-degree AV block -Could be due to Aricept, no inpatient work-up per cardiology  Right knee pain/tenderness -Resolved, x-ray without acute findings  Acute kidney injury -Resolved  Noncompliance with care  Debility/physical deconditioning -SNF pending  Scheduled Meds: . amLODipine  10 mg Oral Daily  . apixaban  5 mg Oral BID  . dextromethorphan-guaiFENesin  1 tablet Oral BID  . donepezil  5 mg Oral QHS  . escitalopram  10 mg Oral Daily  . feeding supplement (ENSURE ENLIVE)  237 mL Oral BID BM  . insulin aspart  0-5 Units Subcutaneous QHS  . insulin aspart  0-9 Units Subcutaneous TID WC  . liver oil-zinc oxide   Topical QID  . metoprolol tartrate  12.5 mg Oral BID  . multivitamin with minerals  1 tablet Oral Daily  . QUEtiapine  50 mg Oral QHS   Continuous Infusions: PRN Meds:.acetaminophen **OR** acetaminophen, diphenhydrAMINE, guaiFENesin-dextromethorphan, hydrALAZINE, ondansetron **OR** ondansetron (ZOFRAN) IV  DVT prophylaxis: Eliquis Code Status: DNR Family Communication: no family at bedside Disposition Plan: home when ready   Consultants:  none  Procedures:  none  Microbiology: none  Antimicrobials: none   Objective: Vitals:   02/21/19 1155 02/21/19 2022 02/21/19 2025 02/22/19 0554  BP: 136/61 123/81 123/81 128/68  Pulse: 72 70 70 65  Resp:  18  18  Temp: (!) 97.5 F (36.4 C) 98.2 F (36.8 C)  98.1 F (36.7 C)  TempSrc: Oral Oral  Oral  SpO2: 93% 93%  97%  Weight:      Height:  Intake/Output Summary (Last 24 hours) at 02/22/2019 0926 Last data filed at 02/22/2019 0500 Gross per 24 hour  Intake --  Output 400 ml  Net -400 ml   Filed Weights   01/31/19 2051 02/01/19 0300  Weight: 81.6 kg 83.7 kg    Examination:  Constitutional: NAD Eyes: No icterus ENMT: mmm Neck: normal,  supple Respiratory: Clear bilaterally without wheezing or crackles, normal respiratory effort Cardiovascular: Regular rate and rhythm, no murmurs.  No peripheral edema Abdomen: Soft, NT, ND, bowel sounds positive Musculoskeletal: no clubbing / cyanosis.  Skin: No rashes seen Neurologic: Nonfocal, equal strength.  Intermittently follows commands  Data Reviewed: I have independently reviewed following labs and imaging studies   CBC: Recent Labs  Lab 02/17/19 0053 02/18/19 0359 02/19/19 0055 02/20/19 0351 02/21/19 0340 02/22/19 0342  WBC 23.8* 17.3* 28.6* 20.9* 18.7* 16.2*  NEUTROABS 19.6*  --   --   --   --   --   HGB 15.7* 15.2* 13.8 13.0 13.1 13.6  HCT 48.0* 48.2* 43.3 39.9 41.2 43.5  MCV 94.5 98.8 97.1 97.1 100.2* 99.8  PLT 357 279 280 171 259 [id number]   Basic Metabolic Panel: Recent Labs  Lab 02/16/19 0449 02/17/19 0053 02/19/19 0055  NA 134* 136 137  K 4.9 4.8 4.3  CL 100 104 104  CO2 25 27 27   GLUCOSE 250* 158* 140*  BUN 40* 42* 35*  CREATININE 1.01* 0.91 0.82  CALCIUM 10.0 10.0 9.4  MG 2.3  --   --    GFR: Estimated Creatinine Clearance: 52.7 mL/min (by C-G formula based on SCr of 0.82 mg/dL). Liver Function Tests: Recent Labs  Lab 02/17/19 0053 02/19/19 0055  AST 17 19  ALT 34 32  ALKPHOS 45 45  BILITOT 0.9 0.7  PROT 5.7* 5.1*  ALBUMIN 2.5* 2.2*   No results for input(s): LIPASE, AMYLASE in the last 168 hours. No results for input(s): AMMONIA in the last 168 hours. Coagulation Profile: No results for input(s): INR, PROTIME in the last 168 hours. Cardiac Enzymes: No results for input(s): CKTOTAL, CKMB, CKMBINDEX, TROPONINI in the last 168 hours. BNP (last 3 results) No results for input(s): PROBNP in the last 8760 hours. HbA1C: No results for input(s): HGBA1C in the last 72 hours. CBG: Recent Labs  Lab 02/20/19 2119 02/21/19 0804 02/21/19 1141 02/21/19 1726 02/21/19 2020  GLUCAP 115* 123* 176* 126* 115*   Lipid Profile: No results for  input(s): CHOL, HDL, LDLCALC, TRIG, CHOLHDL, LDLDIRECT in the last 72 hours. Thyroid Function Tests: No results for input(s): TSH, T4TOTAL, FREET4, T3FREE, THYROIDAB in the last 72 hours. Anemia Panel: No results for input(s): VITAMINB12, FOLATE, FERRITIN, TIBC, IRON, RETICCTPCT in the last 72 hours. Urine analysis:    Component Value Date/Time   COLORURINE YELLOW 02/02/2019 2043   APPEARANCEUR HAZY (A) 02/02/2019 2043   LABSPEC 1.012 02/02/2019 2043   PHURINE 6.0 02/02/2019 2043   GLUCOSEU NEGATIVE 02/02/2019 2043   GLUCOSEU NEGATIVE 11/15/2013 1106   [place] 02/02/2019 2043   BILIRUBINUR NEGATIVE 02/02/2019 2043   BILIRUBINUR neg 12/31/2011 1355   [place] 02/02/2019 2043   PROTEINUR NEGATIVE 02/02/2019 2043   UROBILINOGEN 0.2 11/15/2013 1106   NITRITE NEGATIVE 02/02/2019 2043   LEUKOCYTESUR NEGATIVE 02/02/2019 2043   Sepsis Labs: Invalid input(s): PROCALCITONIN, LACTICIDVEN  No results found for this or any previous visit (from the past 240 hour(s)).    Radiology Studies: No results found.   Marzetta Board, MD, PhD [facility]  Contact via  [email or web address]  [place] P: [phone] F: [phone]

## 2019-02-22 NOTE — TOC Transition Note (Signed)
Transition of Care [facility]) - CM/SW Discharge Note   Patient Details  Name: Taylor Vaughn MRN: [id number] Date of Birth: 01-14-1930  Transition of Care [facility] CM/SW Contact:  Wende Neighbors, LCSW Phone Number: 02/22/2019, 1:58 PM   Clinical Narrative:   Patient to discharge to [facility] via PTAR. CSW has ordered HHPT and HHOT through [facility] and they are aware of her discharge. CSW has contacted [employer] and ordered a wheelchair and hospital bed. Patients daughter and son is aware of discharge plan and is agreeable to discharge plan.  RN to call [phone] (rm# [id number]) and ask for [place] for report.    Final next level of care: Assisted Living Barriers to Discharge: No Barriers Identified   Patient Goals and CMS Choice        Discharge Placement              Patient chooses bed at: [facility] Patient to be transferred to facility by: ptar Name of family member notified: daughter and son Patient and family notified of of transfer: 02/22/19  Discharge Plan and Services                                     Social Determinants of Health (SDOH) Interventions     Readmission Risk Interventions No flowsheet data found.

## 2019-03-23 ENCOUNTER — Non-Acute Institutional Stay: Payer: Medicare Other | Admitting: Licensed Clinical Social Worker

## 2019-03-23 DIAGNOSIS — Z515 Encounter for palliative care: Secondary | ICD-10-CM

## 2019-03-24 ENCOUNTER — Other Ambulatory Visit: Payer: Self-pay

## 2019-03-24 NOTE — Progress Notes (Addendum)
COMMUNITY PALLIATIVE CARE SW NOTE  PATIENT NAME: Taylor Vaughn DOB: [birth year] MRN: [id number]  PRIMARY CARE PROVIDER: Lujean Amel, MD  RESPONSIBLE PARTY:  Acct ID - Guarantor Home Phone Work Phone Relationship Acct Type  [id number] Leretha [phone]  Self P/F     [address], [place], [place], [place] [address]   Due to the COVID-19 crisis, this virtual check-in visit was done via telephone from my office and it was initiated and consent by this patientand orfamily.  PLAN OF CARE and INTERVENTIONS:             1. GOALS OF CARE/ ADVANCE CARE PLANNING:  Goal for patient's daughter, Taylor Vaughn, is for patient to remain at the [facility] 2. SOCIAL/EMOTIONAL/SPIRITUAL ASSESSMENT/ INTERVENTIONS:  SW conducted a Sales executive visit with patient's daughter, Taylor Vaughn.  She stated she just sent her mother's paperwork to Dr. Anthoney Harada in order for her to be the patient's primary MD.  Taylor Vaughn was unclear where the referral for Palliative Care originated.  She wants to review the literature on Palliative Care prior to agreeing to services.  SW to email flyer and refer to website.  She stated that her mother has been at the [facility] for about a week and a half and has adjusted well.  Taylor Vaughn stated patient has her Doctorate in Psychoanalysis.  She sounded tearful on the phone when discussing her relationship with her mother.  SW to follow up after she receives the literature. 3. PATIENT/CAREGIVER EDUCATION/ COPING:  Provided education regarding the Palliative Care program.  Taylor Vaughn copes by problem-solving. 4. PERSONAL EMERGENCY PLAN:  Per facility protocol. 5. COMMUNITY RESOURCES COORDINATION/ HEALTH CARE NAVIGATION:  None. 6. FINANCIAL/LEGAL CONCERNS/INTERVENTIONS:  None.     SOCIAL HX:  Social History   Tobacco Use  . Smoking status: Former Smoker    Types: Cigarettes  . Smokeless tobacco: Never Used  . Tobacco comment: smoked for 3 years in 30's  Substance Use  Topics  . Alcohol use: No    CODE STATUS:  It appears patient was DNR in the hospital. ADVANCED DIRECTIVES: N MOST FORM COMPLETE:  N HOSPICE EDUCATION PROVIDED: Patient's husband was under Hospice care. PPS: Appetite is normal.  She requires assistance with transfers. Duration of visit and documentation:  45 minutes.      Creola Corn Andersson [place], LCSW

## 2019-04-05 ENCOUNTER — Other Ambulatory Visit: Payer: Self-pay

## 2019-04-05 ENCOUNTER — Non-Acute Institutional Stay: Payer: Medicare Other | Admitting: Licensed Clinical Social Worker

## 2019-04-05 DIAGNOSIS — Z515 Encounter for palliative care: Secondary | ICD-10-CM

## 2019-04-06 ENCOUNTER — Non-Acute Institutional Stay: Payer: Medicare Other | Admitting: *Deleted

## 2019-04-06 VITALS — HR 71 | Temp 97.3°F | Resp 14

## 2019-04-06 DIAGNOSIS — Z515 Encounter for palliative care: Secondary | ICD-10-CM

## 2019-04-06 NOTE — Progress Notes (Signed)
COMMUNITY PALLIATIVE CARE SW NOTE  PATIENT NAME: Meria Richhart DOB: 1929-09-08 MRN: [id number]  PRIMARY CARE PROVIDER: Lujean Amel, MD  RESPONSIBLE PARTY:  Acct ID - Guarantor Home Phone Work Phone Relationship Acct Type  [id number] Leretha [phone]  Self P/F     [place], [place], [place], [place] [address]   Due to the COVID-19 crisis, this virtual check-in visit was done via telephone from my office and it was initiated and consent by this patientand orfamily.  PLAN OF CARE and INTERVENTIONS:             1. GOALS OF CARE/ ADVANCE CARE PLANNING:  Daughter would like patient to remain at the [facility] at [facility].  She requests a DNR. 2. SOCIAL/EMOTIONAL/SPIRITUAL ASSESSMENT/ INTERVENTIONS:  SW conducted virtual check-in visit with patient's daughter, [name].  She stated the facility plans on giving her a 30 day notice to move her mother because they can no longer care for her.  [name] reports patient is staying in bed, not eating and not drinking.  Her speech is slurred and has declined cognitively.  She feels her mother wants to die.  She requested Hospice not be mentioned to her mother, although patient's husband died under [place].  SW to forward information to Palliative Care RN, Daryl Eastern. 3. PATIENT/CAREGIVER EDUCATION/ COPING:  [name] copes by problem-solving. 4. PERSONAL EMERGENCY PLAN:  Per facility protocol. 5. COMMUNITY RESOURCES COORDINATION/ HEALTH CARE NAVIGATION:  None. 6. FINANCIAL/LEGAL CONCERNS/INTERVENTIONS:  None.     SOCIAL HX:  Social History   Tobacco Use  . Smoking status: Former Smoker    Types: Cigarettes  . Smokeless tobacco: Never Used  . Tobacco comment: smoked for 3 years in 30's  Substance Use Topics  . Alcohol use: No    CODE STATUS:  DNR  ADVANCED DIRECTIVES: N MOST FORM COMPLETE:  N HOSPICE EDUCATION PROVIDED:  Patient's husband was under Hospice care. PPS:  Per daughter, patient is not eating or  drinking.  She is remaining in bed. Duration of visit and documentation:  45 minutes.      Creola Corn Gabryel Talamo, LCSW

## 2019-04-07 ENCOUNTER — Other Ambulatory Visit: Payer: Self-pay

## 2019-04-07 NOTE — Progress Notes (Signed)
PATIENT NAME: Taylor Vaughn DOB: 12/06/1929 MRN: [id number]  PRIMARY CARE PROVIDER: Koirala, Dibas, MD  RESPONSIBLE PARTY: Carole Shina(daughter) Acct ID - Guarantor Home Phone Work Phone Relationship Acct Type  [id number] - Crothers,E* [phone]  Self P/F     [address], [place], [place] [address]   Covid-19 Pre-screening Negative  PLAN OF CARE and INTERVENTION:  1. ADVANCE CARE PLANNING/GOALS OF CARE: Goal is for patient to be comfortable, calm and avoid hospitalizations. She has a DNR. 2. PATIENT/CAREGIVER EDUCATION: Explained Palliative Care Services, Hospice Education 3. DISEASE STATUS: Met with patient and her daughter in patient's room at [facility] unit. Prior to entering the room, daughter provided me with patient's health history. She explained that a year ago, patient was living at [facility] [place] and was ambulatory. However, while there daughter began noticing s/s of dementia, stating that she appeared more forgetful, starting to isolate more and irritability. She was seen by a Neurologist and scans showed brain deterioration. She was hospitalized from 01/31/19 to 02/22/19 with a  diagnoses of Covid-19 and pneumonia. While in the hospital, daughter states that patient was scared and paranoid, and during a telephone call was noted to be yelling out and screaming repeatedly. Upon discharge, patient was transferred to [facility] unit. At this point, patient was non-ambulatory, requiring assistance with all ADLs, and transported via wheelchair. She only stayed there for about a week because daughter was unable to visit her at this facility d/t Covid, so had her moved to [facility] unit on 03/10/19. During her first night at the facility, patient was very confused, yelling out and combative. She had a fall at the facility on 03/30/19 and was sent to the ED for evaluation. No apparent injuries and  patient was released back to the facility the following day. Daughter states that she continues to decline and is refusing to get out of bed and not eating well. She is sleeping most of the day and during a conversation they had recently, she noticed that patient is starting to slur her words. Upon arrival into patient's room, she is lying in bed with the head of bed elevated sleeping soundly. She appears calm/comfortable without any physical indicators of pain or shortness of breath. She did not arouse to verbal or tactile stimulation. I spoke to the facility staff who reports that Speech Therapy was in today working with her and got patient to eat a few bites of soup and take a few sips of water. Patient is currently taking Xanax 3 times/day for behaviors, which appears to be effective. She is now total care with all ADLs and incontinent of both bowel and bladder. She wears adult briefs. She did attempt to open her eyes when her daughter kissed her goodbye. Explained that I feel patient is more appropriate for hospice care. Daughter is agreeable. She does not wish for patient to continue therapy and just wants her to remain comfortable. I discussed this case with [facility]'s Medical Director who says patient meets eligibility criteria for hospice. I contacted Dr. Haber, PCP, who gave verbal orders for a hospice consult. This information was sent to our referral center, who will reach out to patient's daughter with date/time of hospice Admission visit. Daughter is appreciative.    HISTORY OF PRESENT ILLNESS: This is a 86 yo female who resides at [facility] ([facility]). Palliative care consult was made to help assess for   PATIENT NAME: Taylor Vaughn DOB: 12/06/1929 MRN: [id number]  PRIMARY CARE PROVIDER: Koirala, Dibas, MD  RESPONSIBLE PARTY: Carole Shina(daughter) Acct ID - Guarantor Home Phone Work Phone Relationship Acct Type  [id number] - Crothers,E* [phone]  Self P/F     [address], [address], [place], [place] [address]   Covid-19 Pre-screening Negative  PLAN OF CARE and INTERVENTION:  1. ADVANCE CARE PLANNING/GOALS OF CARE: Goal is for patient to be comfortable, calm and avoid hospitalizations. She has a DNR. 2. PATIENT/CAREGIVER EDUCATION: Explained Palliative Care Services, Hospice Education 3. DISEASE STATUS: Met with patient and her daughter in patient's room at [facility] unit. Prior to entering the room, daughter provided me with patient's health history. She explained that a year ago, patient was living at [facility] [place] and was ambulatory. However, while there daughter began noticing s/s of dementia, stating that she appeared more forgetful, starting to isolate more and irritability. She was seen by a Neurologist and scans showed brain deterioration. She was hospitalized from 01/31/19 to 02/22/19 with a  diagnoses of Covid-19 and pneumonia. While in the hospital, daughter states that patient was scared and paranoid, and during a telephone call was noted to be yelling out and screaming repeatedly. Upon discharge, patient was transferred to [facility] unit. At this point, patient was non-ambulatory, requiring assistance with all ADLs, and transported via wheelchair. She only stayed there for about a week because daughter was unable to visit her at this facility d/t Covid, so had her moved to [facility] unit on 03/10/19. During her first night at the facility, patient was very confused, yelling out and combative. She had a fall at the facility on 03/30/19 and was sent to the ED for evaluation. No apparent injuries and  patient was released back to the facility the following day. Daughter states that she continues to decline and is refusing to get out of bed and not eating well. She is sleeping most of the day and during a conversation they had recently, she noticed that patient is starting to slur her words. Upon arrival into patient's room, she is lying in bed with the head of bed elevated sleeping soundly. She appears calm/comfortable without any physical indicators of pain or shortness of breath. She did not arouse to verbal or tactile stimulation. I spoke to the facility staff who reports that Speech Therapy was in today working with her and got patient to eat a few bites of soup and take a few sips of water. Patient is currently taking Xanax 3 times/day for behaviors, which appears to be effective. She is now total care with all ADLs and incontinent of both bowel and bladder. She wears adult briefs. She did attempt to open her eyes when her daughter kissed her goodbye. Explained that I feel patient is more appropriate for hospice care. Daughter is agreeable. She does not wish for patient to continue therapy and just wants her to remain comfortable. I discussed this case with [facility]'s Medical Director who says patient meets eligibility criteria for hospice. I contacted Dr. Haber, PCP, who gave verbal orders for a hospice consult. This information was sent to our referral center, who will reach out to patient's daughter with date/time of hospice Admission visit. Daughter is appreciative.    HISTORY OF PRESENT ILLNESS: This is a 86 yo female who resides at [facility] ([facility]). Palliative care consult was made to help assess for

## 2019-05-03 DEATH — deceased

## 2020-03-14 IMAGING — CR LUMBAR SPINE - 2-3 VIEW
3 series · 3 of 3 positions shown · non-contrast
Comparison: Lumbar radiograph 05/02/2015

CLINICAL DATA: Lower back pain for 2 months, no known injury.

EXAM:
LUMBAR SPINE - 2-3 VIEW

[t lumbar spine ap]
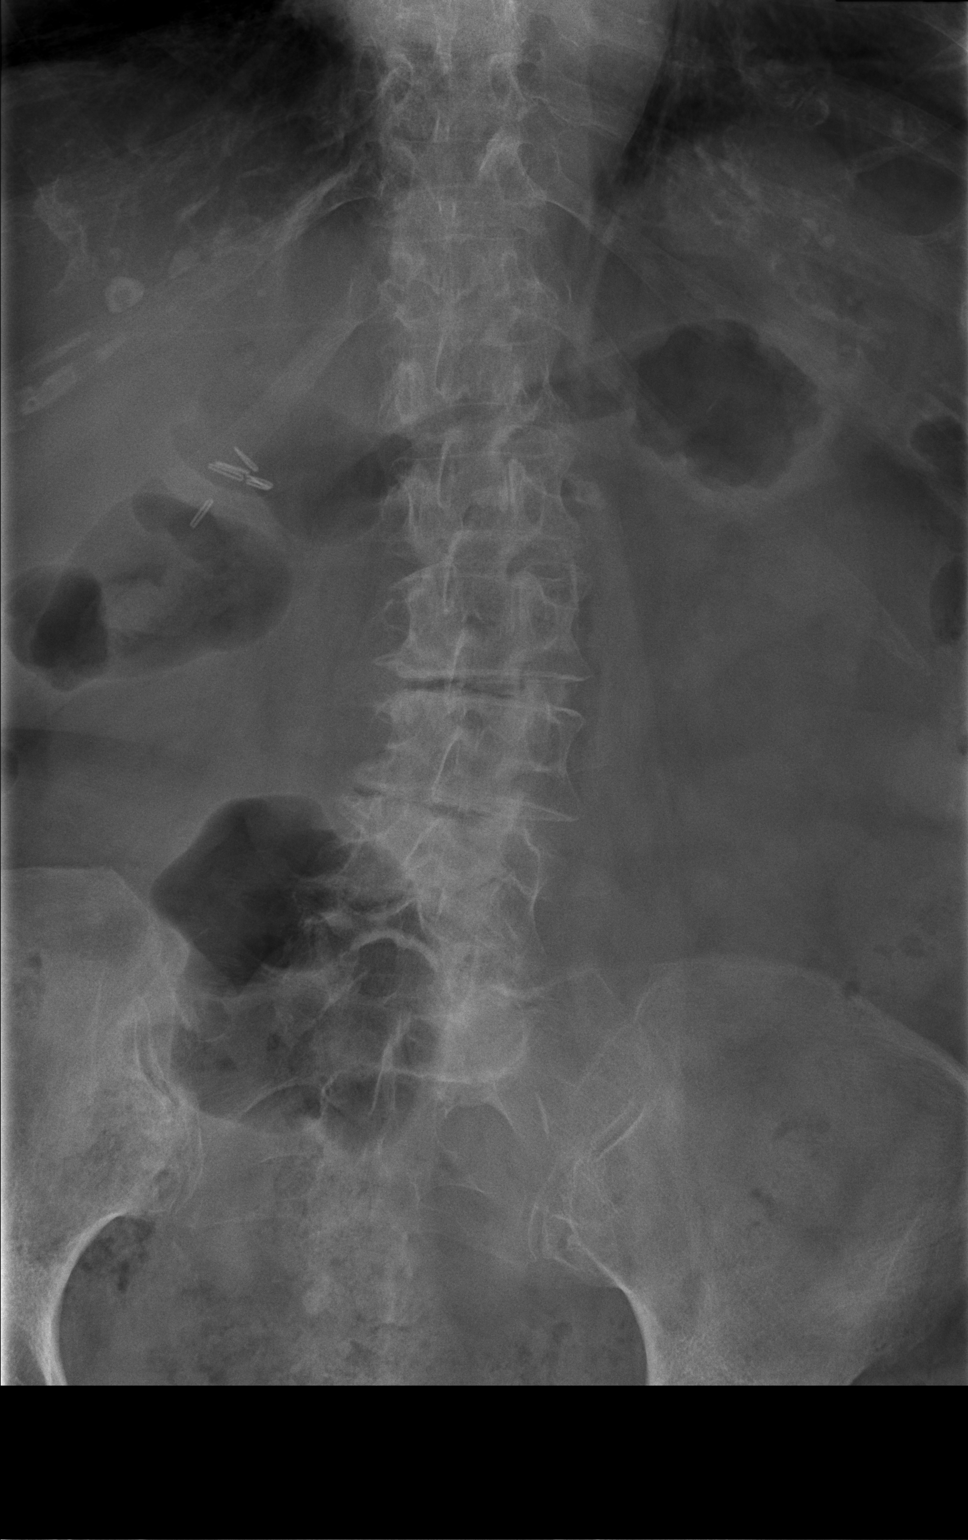

[t lumbar spine lat]
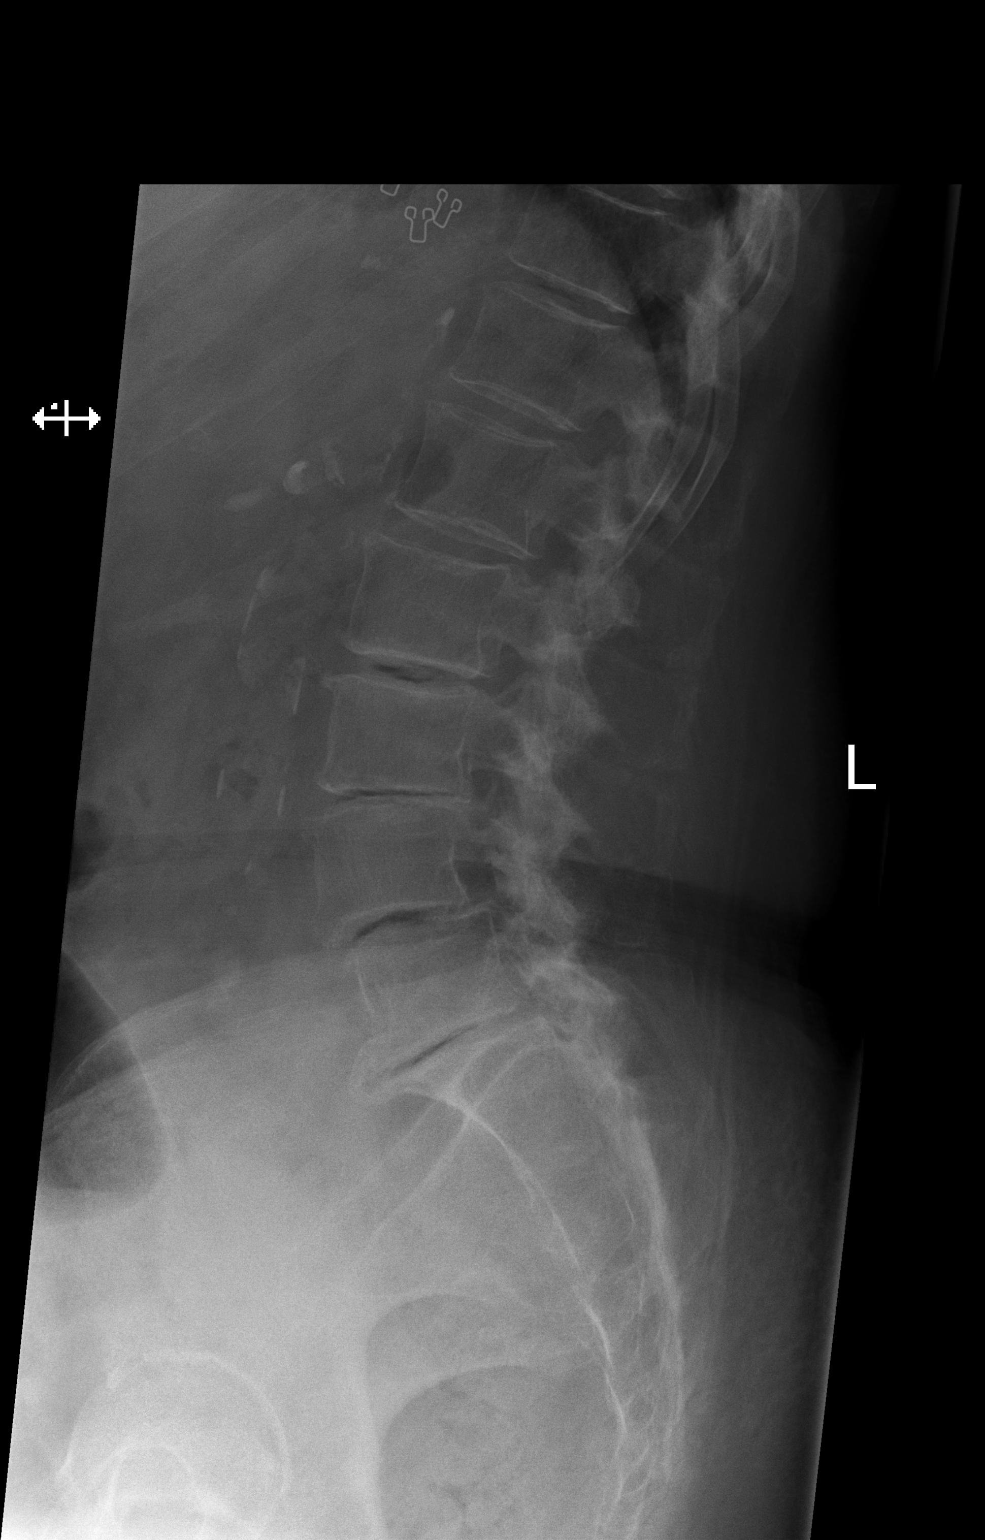

[t lumbar l-5 s-1 spot]
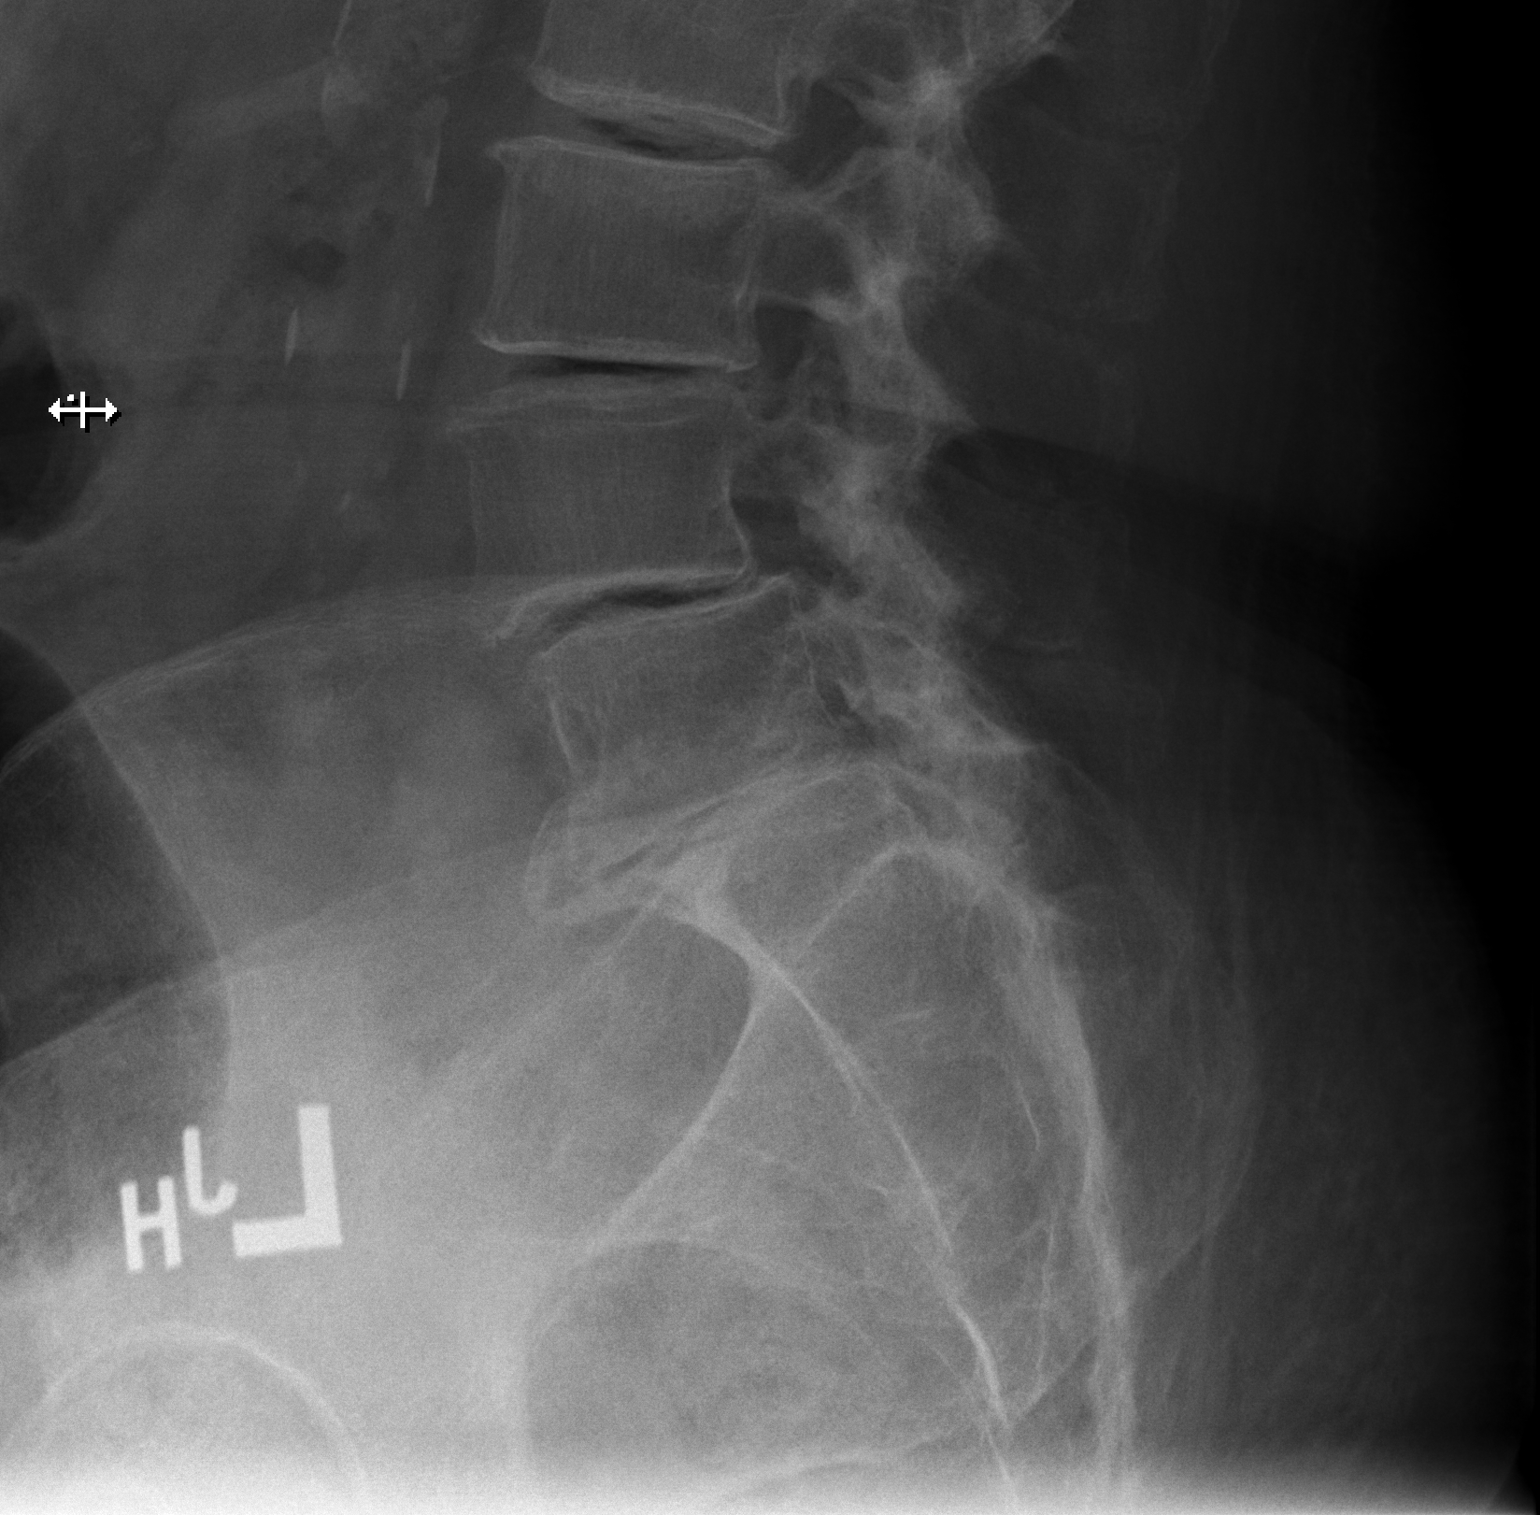

[3 of 3 positions shown; findings below may reference images not displayed]

FINDINGS: No acute fracture or traumatic listhesis. Multilevel intervertebral
disc height loss with vacuum phenomenon in the lower lumbar spine.
Grade 1 anterolisthesis of L4 on L5. Facet degenerative changes are
present as well, progressively worsening towards the lower lumbar
levels. Mild levocurvature of the spine.

Cholecystectomy clips are present in the right upper quadrant.
Atherosclerotic calcification is present within the aorta. No
aneurysmal dilatation is evident. Remaining soft tissues are
unremarkable.
IMPRESSION: Multilevel degenerative changes throughout the lower lumbar spine,
progressed towards the lower lumbar levels. No acute osseous
abnormality.

## 2020-07-26 IMAGING — DX DG KNEE 1-2V*R*
2 series · 2 of 2 positions shown · non-contrast
Comparison: None.

CLINICAL DATA: Right knee pain and swelling.

EXAM:
RIGHT KNEE - 1-2 VIEW

[knee ap]
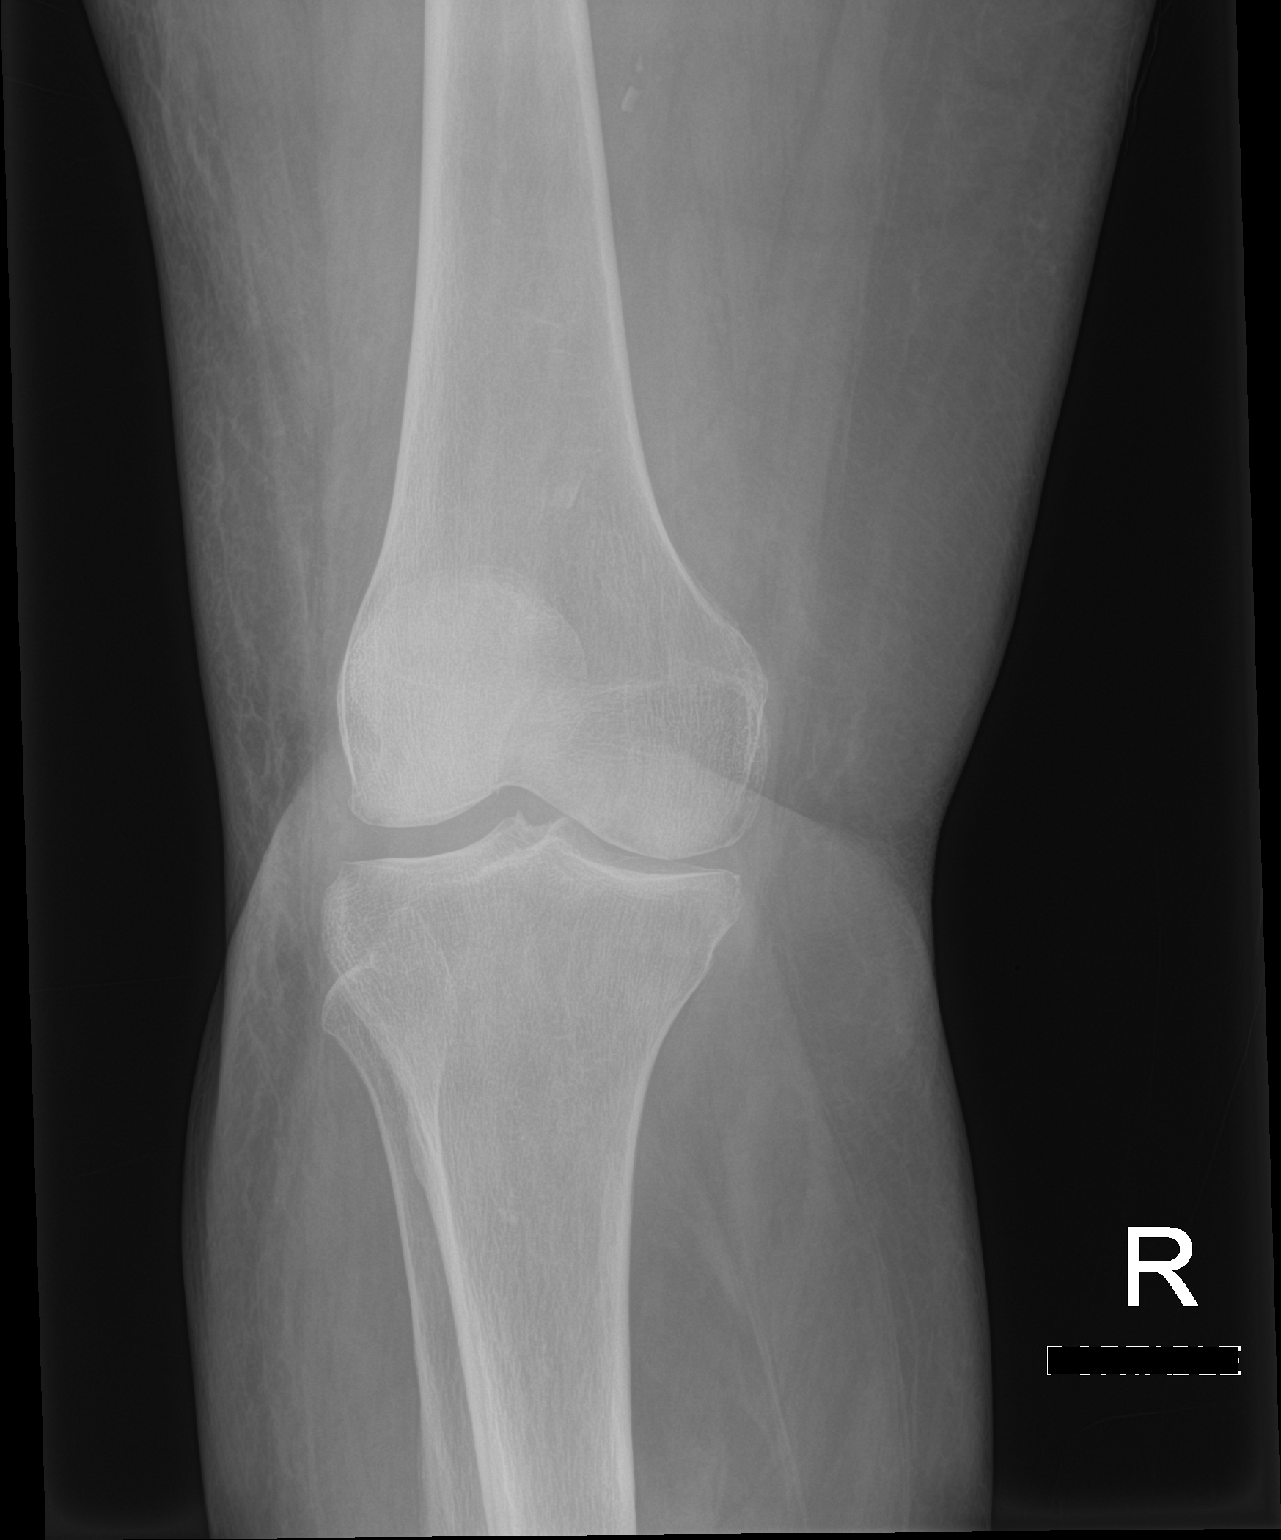

[knee lat]
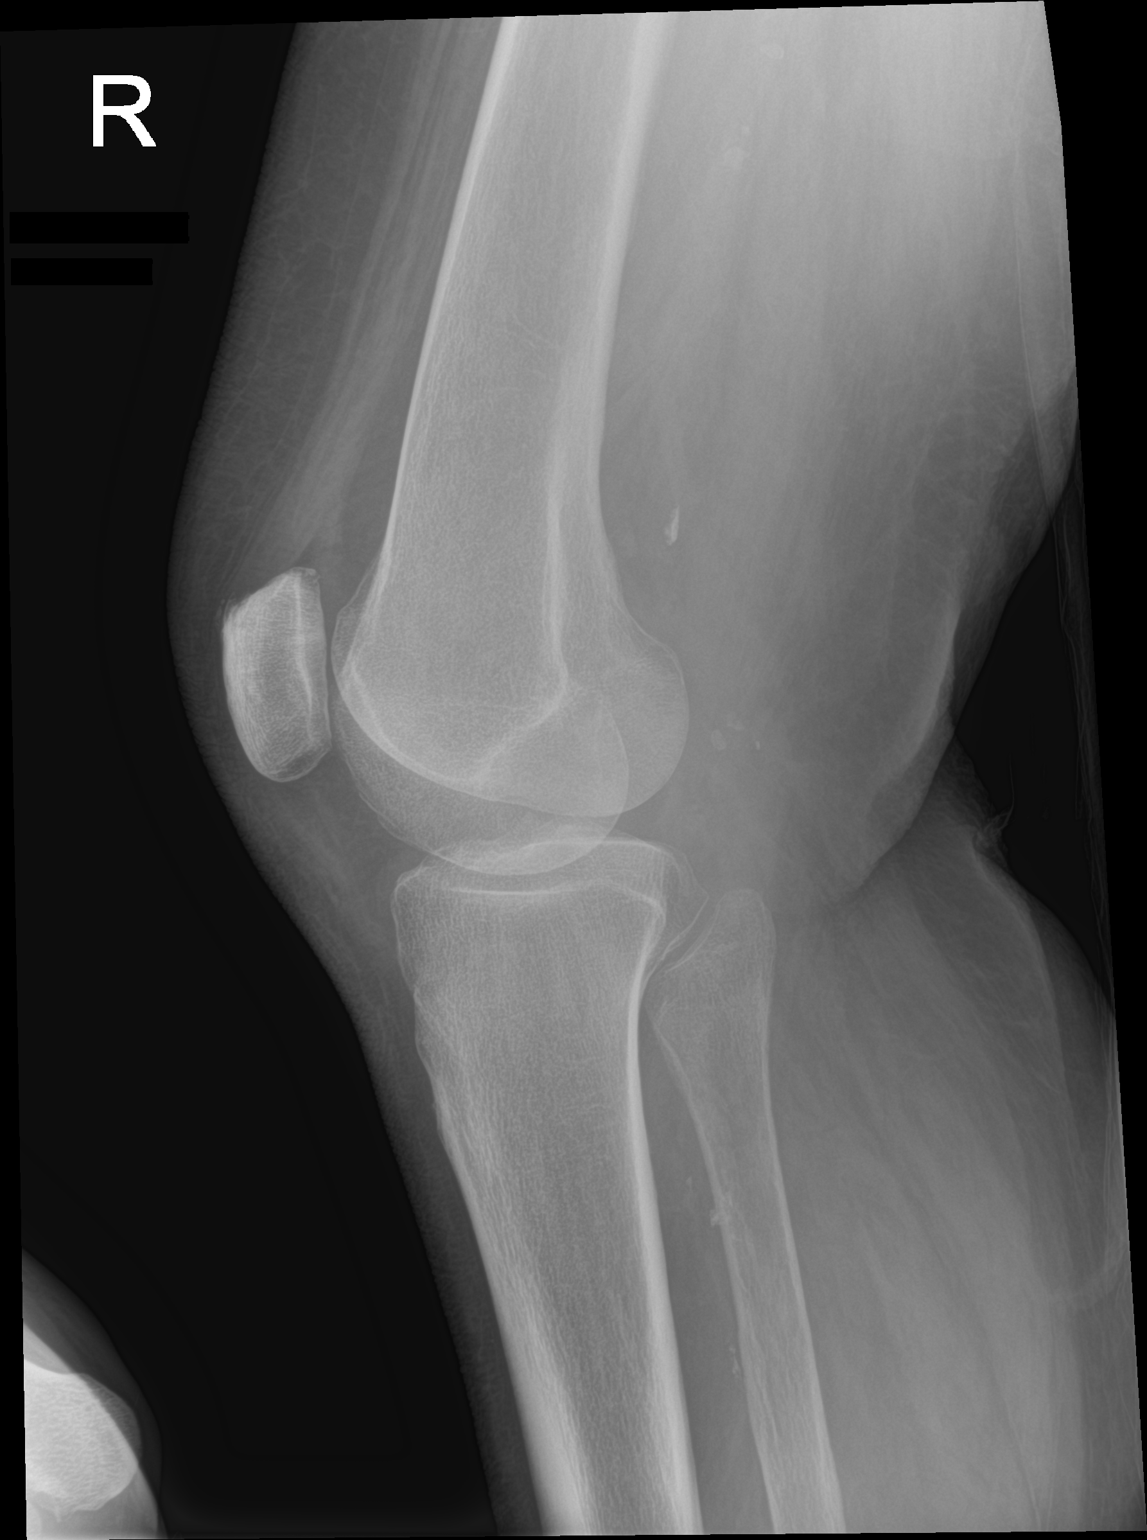

[2 of 2 positions shown; findings below may reference images not displayed]

FINDINGS: No fracture or dislocation. Mild degenerative change of the right
knee and patellofemoral joints with joint space loss and subchondral
sclerosis. There is minimal spurring of the tibial spines. No
evidence of chondrocalcinosis. No joint effusion. Minimal
enthesopathic change involving the superior pole of the patella.
Scattered adjacent vascular calcifications. Regional soft tissues
appear otherwise normal. No radiopaque foreign body.
IMPRESSION: 1. No acute findings.
2. Mild degenerative change of the knee.
